# Patient Record
Sex: Male | Born: 1935 | Race: White | Hispanic: No | Smoking: Former smoker
Health system: Southern US, Community
[De-identification: ages and names within clinical notes are randomized; demographics above are authoritative.]

## PROBLEM LIST (undated history)

## (undated) DIAGNOSIS — E785 Hyperlipidemia, unspecified: Secondary | ICD-10-CM

## (undated) DIAGNOSIS — I25119 Atherosclerotic heart disease of native coronary artery with unspecified angina pectoris: Secondary | ICD-10-CM

## (undated) DIAGNOSIS — T8859XA Other complications of anesthesia, initial encounter: Secondary | ICD-10-CM

## (undated) DIAGNOSIS — I10 Essential (primary) hypertension: Secondary | ICD-10-CM

## (undated) DIAGNOSIS — T4145XA Adverse effect of unspecified anesthetic, initial encounter: Secondary | ICD-10-CM

## (undated) DIAGNOSIS — I63412 Cerebral infarction due to embolism of left middle cerebral artery: Secondary | ICD-10-CM

## (undated) DIAGNOSIS — I639 Cerebral infarction, unspecified: Secondary | ICD-10-CM

## (undated) DIAGNOSIS — I482 Chronic atrial fibrillation, unspecified: Secondary | ICD-10-CM

## (undated) DIAGNOSIS — Z8719 Personal history of other diseases of the digestive system: Secondary | ICD-10-CM

## (undated) DIAGNOSIS — R0989 Other specified symptoms and signs involving the circulatory and respiratory systems: Secondary | ICD-10-CM

## (undated) DIAGNOSIS — I2510 Atherosclerotic heart disease of native coronary artery without angina pectoris: Secondary | ICD-10-CM

## (undated) DIAGNOSIS — F329 Major depressive disorder, single episode, unspecified: Secondary | ICD-10-CM

## (undated) DIAGNOSIS — I519 Heart disease, unspecified: Secondary | ICD-10-CM

## (undated) DIAGNOSIS — F4321 Adjustment disorder with depressed mood: Secondary | ICD-10-CM

## (undated) DIAGNOSIS — I509 Heart failure, unspecified: Secondary | ICD-10-CM

## (undated) DIAGNOSIS — Z9889 Other specified postprocedural states: Secondary | ICD-10-CM

## (undated) DIAGNOSIS — T7840XA Allergy, unspecified, initial encounter: Secondary | ICD-10-CM

## (undated) DIAGNOSIS — I4891 Unspecified atrial fibrillation: Secondary | ICD-10-CM

## (undated) DIAGNOSIS — E7800 Pure hypercholesterolemia, unspecified: Secondary | ICD-10-CM

## (undated) DIAGNOSIS — F32A Depression, unspecified: Secondary | ICD-10-CM

## (undated) DIAGNOSIS — Z7901 Long term (current) use of anticoagulants: Secondary | ICD-10-CM

## (undated) DIAGNOSIS — Z8711 Personal history of peptic ulcer disease: Secondary | ICD-10-CM

## (undated) DIAGNOSIS — I219 Acute myocardial infarction, unspecified: Secondary | ICD-10-CM

## (undated) DIAGNOSIS — Z9289 Personal history of other medical treatment: Secondary | ICD-10-CM

## (undated) DIAGNOSIS — I6523 Occlusion and stenosis of bilateral carotid arteries: Secondary | ICD-10-CM

## (undated) DIAGNOSIS — I6529 Occlusion and stenosis of unspecified carotid artery: Secondary | ICD-10-CM

## (undated) HISTORY — DX: Allergy, unspecified, initial encounter: T78.40XA

## (undated) HISTORY — DX: Adjustment disorder with depressed mood: F43.21

## (undated) HISTORY — DX: Essential (primary) hypertension: I10

## (undated) HISTORY — PX: LUMBAR DISC SURGERY: SHX700

## (undated) HISTORY — PX: CATARACT EXTRACTION W/ INTRAOCULAR LENS  IMPLANT, BILATERAL: SHX1307

## (undated) HISTORY — DX: Occlusion and stenosis of bilateral carotid arteries: I65.23

## (undated) HISTORY — DX: Cerebral infarction due to embolism of left middle cerebral artery: I63.412

## (undated) HISTORY — DX: Occlusion and stenosis of unspecified carotid artery: I65.29

## (undated) HISTORY — DX: Hyperlipidemia, unspecified: E78.5

## (undated) HISTORY — PX: STOMACH SURGERY: SHX791

## (undated) HISTORY — PX: TONSILLECTOMY AND ADENOIDECTOMY: SUR1326

## (undated) HISTORY — PX: COLON SURGERY: SHX602

## (undated) HISTORY — DX: Long term (current) use of anticoagulants: Z79.01

## (undated) HISTORY — DX: Cerebral infarction, unspecified: I63.9

## (undated) HISTORY — PX: CARDIAC CATHETERIZATION: SHX172

## (undated) HISTORY — DX: Other specified symptoms and signs involving the circulatory and respiratory systems: R09.89

## (undated) HISTORY — DX: Atherosclerotic heart disease of native coronary artery without angina pectoris: I25.10

## (undated) HISTORY — PX: TRANSURETHRAL RESECTION OF PROSTATE: SHX73

## (undated) HISTORY — PX: BACK SURGERY: SHX140

## (undated) HISTORY — PX: CARPAL TUNNEL RELEASE: SHX101

## (undated) HISTORY — DX: Other specified postprocedural states: Z98.890

## (undated) HISTORY — DX: Heart disease, unspecified: I51.9

## (undated) HISTORY — DX: Atherosclerotic heart disease of native coronary artery with unspecified angina pectoris: I25.119

## (undated) HISTORY — PX: MULTIPLE TOOTH EXTRACTIONS: SHX2053

## (undated) HISTORY — DX: Acute myocardial infarction, unspecified: I21.9

## (undated) HISTORY — DX: Pure hypercholesterolemia, unspecified: E78.00

## (undated) HISTORY — DX: Unspecified atrial fibrillation: I48.91

---

## 1976-02-11 HISTORY — PX: CORONARY ARTERY BYPASS GRAFT: SHX141

## 2012-06-10 ENCOUNTER — Encounter: Payer: Self-pay | Admitting: Cardiovascular Disease

## 2012-06-10 ENCOUNTER — Encounter (INDEPENDENT_AMBULATORY_CARE_PROVIDER_SITE_OTHER): Payer: Medicare Other

## 2012-06-10 ENCOUNTER — Encounter: Payer: Self-pay | Admitting: *Deleted

## 2012-06-10 ENCOUNTER — Ambulatory Visit (INDEPENDENT_AMBULATORY_CARE_PROVIDER_SITE_OTHER): Payer: Medicare Other | Admitting: Cardiovascular Disease

## 2012-06-10 VITALS — BP 132/82 | HR 73 | Wt 160.0 lb

## 2012-06-10 DIAGNOSIS — R0989 Other specified symptoms and signs involving the circulatory and respiratory systems: Secondary | ICD-10-CM

## 2012-06-10 DIAGNOSIS — I6529 Occlusion and stenosis of unspecified carotid artery: Secondary | ICD-10-CM

## 2012-06-10 DIAGNOSIS — I4891 Unspecified atrial fibrillation: Secondary | ICD-10-CM

## 2012-06-10 DIAGNOSIS — E785 Hyperlipidemia, unspecified: Secondary | ICD-10-CM | POA: Insufficient documentation

## 2012-06-10 DIAGNOSIS — I519 Heart disease, unspecified: Secondary | ICD-10-CM

## 2012-06-10 HISTORY — DX: Other specified symptoms and signs involving the circulatory and respiratory systems: R09.89

## 2012-06-10 HISTORY — DX: Unspecified atrial fibrillation: I48.91

## 2012-06-10 LAB — CBC WITH DIFFERENTIAL/PLATELET
Basophils Relative: 0.6 % (ref 0.0–3.0)
Eosinophils Relative: 4.8 % (ref 0.0–5.0)
HCT: 43.6 % (ref 39.0–52.0)
Lymphs Abs: 2.2 10*3/uL (ref 0.7–4.0)
MCV: 84.6 fl (ref 78.0–100.0)
Monocytes Absolute: 1 10*3/uL (ref 0.1–1.0)
Platelets: 176 10*3/uL (ref 150.0–400.0)
WBC: 11.5 10*3/uL — ABNORMAL HIGH (ref 4.5–10.5)

## 2012-06-10 LAB — BASIC METABOLIC PANEL
BUN: 17 mg/dL (ref 6–23)
Calcium: 9.3 mg/dL (ref 8.4–10.5)
GFR: 59.56 mL/min — ABNORMAL LOW (ref >60.00)
Glucose, Bld: 93 mg/dL (ref 70–99)
Sodium: 139 mEq/L (ref 135–145)

## 2012-06-10 LAB — APTT: aPTT: 31.3 s — ABNORMAL HIGH (ref 21.7–28.8)

## 2012-06-10 MED ORDER — RIVAROXABAN 20 MG PO TABS: 20.0000 mg | ORAL_TABLET | Freq: Every day | ORAL | Status: DC

## 2012-06-10 NOTE — Assessment & Plan Note (Signed)
Cholesterol is at goal.  Continue current dose of statin and diet Rx.  No myalgias or side effects.  F/U  LFT's in 6 months. No results found for this basename: [facility]   Labs with Dr Lin Landsman

## 2012-06-10 NOTE — Patient Instructions (Addendum)
Your physician recommends that you schedule a follow-up appointment in: 51 - Dave Harris has recommended you make the following change in your medication:  START   XARELTO 20 MG  [place]  Your physician has requested that you have an echocardiogram. Echocardiography is a painless test that uses sound waves to create images of your heart. It provides your doctor with information about the size and shape of your heart and how well your heart's chambers and valves are working. This procedure takes approximately one hour. There are no restrictions for this procedure.   Your physician recommends that you return for lab work in: TODAY  BMET CBC PT PTT  DX  427.31 V58.69  [place]

## 2012-06-10 NOTE — Progress Notes (Signed)
Patient ID: Dave Harris, male   DOB: [birth year], 79 y.o.   MRN: [id number] 79 yo referred by Dr Lin Landsman for cards f/u Distant history of CABG no records available.  Has not had any dyspnea or chest pain.  Thinks CABG was done at [facility] in 88 In office today he was in atrial flutter. Unaware of this Notes occasional palpitations. No mention in records of arrhythmia.  History of PVD with bruits Duplex reviewed form [id number] And [id number] LICA and [id number] RICA  No TIA symptoms No bleeding issues.  No contraindications to anticoagulation.  Normal renal function in past   ROS: Denies fever, malais, weight loss, blurry vision, decreased visual acuity, cough, sputum, SOB, hemoptysis, pleuritic pain, palpitaitons, heartburn, abdominal pain, melena, lower extremity edema, claudication, or rash.  All other systems reviewed and negative   General: Affect appropriate Healthy:  appears stated age 7: normal Neck supple with no adenopathy JVP normal bilateral bruits no thyromegaly Lungs clear with no wheezing and good diaphragmatic motion Heart:  S1/S2 no murmur,rub, gallop or click PMI normal Abdomen: benighn, BS positve, no tenderness, no AAA no bruit.  No HSM or HJR Distal pulses intact with no bruits No edema Neuro non-focal Skin warm and dry No muscular weakness  Medications Current Outpatient Prescriptions  Medication Sig Dispense Refill  . aspirin 325 MG tablet Take 325 mg by mouth daily.      Marland Kitchen atorvastatin (LIPITOR) 40 MG tablet Take 40 mg by mouth daily.      . Rivaroxaban (XARELTO) 20 MG TABS Take 1 tablet (20 mg total) by mouth daily.  30 tablet  11   No current facility-administered medications for this visit.    Allergies Plavix  Family History: Family History  Problem Relation Age of Onset  . CAD      Social History: History   Social History  . Marital Status: Married    Spouse Name: N/A    Number of Children: N/A  . Years of Education: N/A   Occupational  History  . Not on file.   Social History Main Topics  . Smoking status: Former Research scientist (life sciences)  . Smokeless tobacco: Not on file  . Alcohol Use: No  . Drug Use: No  . Sexually Active: Not on file   Other Topics Concern  . Not on file   Social History Narrative  . No narrative on file    Electrocardiogram: Afib / Flutter rate 73  Nonspecific ST/T wave changes  Assessment and Plan

## 2012-06-10 NOTE — Assessment & Plan Note (Addendum)
Rate even with no AV nodal drug seems ok.  Start xarelto.  Check labs Echo to assess atrial sizes and EF as well valve disease. Suspect rate control and anticoagulation will be best F/U anticoagulation clinic Risks and benefits discussed He has relative who had issues with coumadin and he does not want to come for frequent blood work.

## 2012-06-10 NOTE — Assessment & Plan Note (Signed)
Will get CABG records No indication for stress test.  Echo for EF

## 2012-06-10 NOTE — Assessment & Plan Note (Signed)
Duplex today with moderate left sided disease Decrease ASA to 81 mg since starting anticoagulation Duplex in 6 months

## 2012-06-16 ENCOUNTER — Ambulatory Visit (HOSPITAL_COMMUNITY): Payer: Medicare Other | Attending: Cardiovascular Disease | Admitting: Radiology

## 2012-06-16 VITALS — Ht 69.0 in

## 2012-06-16 DIAGNOSIS — I4891 Unspecified atrial fibrillation: Secondary | ICD-10-CM | POA: Insufficient documentation

## 2012-06-16 DIAGNOSIS — I4892 Unspecified atrial flutter: Secondary | ICD-10-CM

## 2012-06-16 DIAGNOSIS — E785 Hyperlipidemia, unspecified: Secondary | ICD-10-CM | POA: Insufficient documentation

## 2012-06-16 NOTE — Progress Notes (Signed)
Echocardiogram performed.  

## 2012-06-22 ENCOUNTER — Encounter: Payer: Self-pay | Admitting: Cardiovascular Disease

## 2012-06-22 ENCOUNTER — Telehealth: Payer: Self-pay | Admitting: *Deleted

## 2012-06-22 NOTE — Telephone Encounter (Signed)
Does not appear that he can take blood thinners.  F/U with me next available

## 2012-06-22 NOTE — Telephone Encounter (Signed)
PT AWARE TO CONTINUE TO HOLD   XARELTO  AND  KEEP APPT  SCHEDULED FOR 07-30-12 AT  3:00 PM  WITH DR Johnsie Cancel  INSTRUCTED IF CONT TO HAVE BLEEDING OR FEELS WORSE TO CALL PT VERBALIZED UNDERSTANDING .Adonis Housekeeper

## 2012-06-22 NOTE — Telephone Encounter (Signed)
JENNIFER FROM DR REDDINGS' OFFICE CALLED PT WALKED  INTO OFFICE COMPLAINING WITH BLEEDING FROM GUMS , SWOLLEN GUMS AND SPITTING UP BLOOD   WAS RECENTLY STARTED ON  XARELTO  FOR AFLUTTER   INSTRUCTIONS GIVEN TO  JENNIFER TO HAVE PT STOP  XARELTO  AND TO GET CBC  .WILL FORWARD TO DR Johnsie Cancel  FOR REVIEW .Adonis Housekeeper

## 2012-06-30 NOTE — Telephone Encounter (Signed)
DR [facility] CALLED AND STATED PT DID NOT WANT TO TAKE XARELTO  PT WAS  TOLD TO HOLD  DUE TO BLEEDING  -PT HAS APPT  IN June WITH DR Johnsie Cancel  TO DISCUSS OTHER TREATMENT PLANS   PER DR [place] WOULD LIKE A  COPY OF CAROTID FAXED FOR HIS INFO COPY SENT  AS REQUESTED  [place] NUMBER  [phone]./CY

## 2012-07-06 NOTE — Telephone Encounter (Signed)
Spoke to patient's emergency contact Madaline Guthrie) to verify that Dr. Johnsie Cancel indicated on 06/30/12 that patient should keep the June 20th appt at 3pm, so as to discuss further treatment options. Ms. Lily Peer agreed to plan and stated she would share information with the patient.

## 2012-07-06 NOTE — Telephone Encounter (Signed)
New problem    Please advise if appt on  6/20 she they keep or cancel due to medication was stop.

## 2012-07-12 ENCOUNTER — Telehealth: Payer: Self-pay | Admitting: Cardiovascular Disease

## 2012-07-12 NOTE — Telephone Encounter (Signed)
New Problem  Pt's daughter called to let Dr Johnsie Cancel know that pt is having a procedure done this Friday. He is supposed to be put to sleep and run a light down his throat to find out why he is having difficulty swallowing. Pt did not alert that physician that he sees a Cardiologist at all. Daughter is concerned if the procedure should be done. She said it is schedule at [facility]. [phone].  She just felt like Dr Johnsie Cancel should know.

## 2012-07-13 NOTE — Telephone Encounter (Signed)
Dave Harris PT MAY PROCEED WITH PROCEDURE  ON 07-16-12 ALSO LEFT VOICE MAIL WITH NINA  THAT PT IS OKAY TO  PROCEED  ON 07-16-12 WITHOUT ANYTHING SPECIAL ./CY

## 2012-07-13 NOTE — Telephone Encounter (Signed)
Follow-up:    Called in wanting to know if the patient needs any special treatment prior to his dilation on 07/16/12.  Please call back.

## 2012-07-13 NOTE — Telephone Encounter (Signed)
Echo looked ok should be ok for ? Endoscopy without anything special

## 2012-07-30 ENCOUNTER — Encounter: Payer: Self-pay | Admitting: Cardiovascular Disease

## 2012-07-30 ENCOUNTER — Ambulatory Visit (INDEPENDENT_AMBULATORY_CARE_PROVIDER_SITE_OTHER): Payer: Medicare Other | Admitting: Cardiovascular Disease

## 2012-07-30 VITALS — BP 133/68 | HR 66 | Ht 69.0 in | Wt 164.0 lb

## 2012-07-30 DIAGNOSIS — I519 Heart disease, unspecified: Secondary | ICD-10-CM

## 2012-07-30 DIAGNOSIS — E785 Hyperlipidemia, unspecified: Secondary | ICD-10-CM

## 2012-07-30 DIAGNOSIS — I4891 Unspecified atrial fibrillation: Secondary | ICD-10-CM

## 2012-07-30 DIAGNOSIS — R0989 Other specified symptoms and signs involving the circulatory and respiratory systems: Secondary | ICD-10-CM

## 2012-07-30 NOTE — Assessment & Plan Note (Signed)
Left ICA 60-79% stenosis.  No TIA symptoms.  Continue antiplatelet Rx and F/U carotid duplex in 6 months   

## 2012-07-30 NOTE — Progress Notes (Signed)
Patient ID: Dave Harris, male   DOB: 01/23/1930, 79 y.o.   MRN: [id number] 79 yo referred by Dr Lin Landsman for cards f/u Distant history of CABG no records available. Has not had any dyspnea or chest pain. Thinks CABG was done at [facility] in 88  In office May he was in atrial flutter. Unaware of this Notes occasional palpitations. No mention in records of arrhythmia. History of PVD with bruits Duplex reviewed form [id number]  And [id number] LICA and [id number] RICA  He had hemoptysis or coffe grounds after 5 xarelto doses and insurance wouldn't cover novel agents.  Eventually had esophagus stretched by GI For dysphagia but denies lesions. Quit smoking a long time ago Indicates normal Xrays in recent past [place].  He really isn't clear if bleeding was coming from bronchi or throat  Echo 06/16/12 normal EF Study Conclusions  - Left ventricle: The cavity size was normal. Systolic function was normal. The estimated ejection fraction was in the range of 55% to 60%. Wall motion was normal; there were no regional wall motion abnormalities. - Mitral valve: Mild regurgitation. - Left atrium: The atrium was moderately dilated. - Right atrium: The atrium was mildly dilated. - Atrial septum: No defect or patent foramen ovale was identified. - Pericardium, extracardiac: A trivial pericardial effusion was identified posterior to the heart.  Duplex carotids [id number] LICA  ROS: Denies fever, malais, weight loss, blurry vision, decreased visual acuity, cough, sputum, SOB, hemoptysis, pleuritic pain, palpitaitons, heartburn, abdominal pain, melena, lower extremity edema, claudication, or rash.  All other systems reviewed and negative  General: Affect appropriate Healthy:  appears stated age 39: normal Neck supple with no adenopathy JVP normal bilateral  bruits no thyromegaly Lungs clear with no wheezing and good diaphragmatic motion Heart:  S1/S2 no murmur, no rub, gallop or click PMI normal Abdomen:  benighn, BS positve, no tenderness, no AAA no bruit.  No HSM or HJR Distal pulses intact with no bruits No edema Neuro non-focal Skin warm and dry No muscular weakness   Current Outpatient Prescriptions  Medication Sig Dispense Refill  . aspirin 325 MG tablet Take 325 mg by mouth daily.      Marland Kitchen atorvastatin (LIPITOR) 40 MG tablet Take 40 mg by mouth daily.       No current facility-administered medications for this visit.    Allergies  Plavix  Electrocardiogram: NSR rate 63 ICRBBB nonspecific ST/T wave changes  Assessment and Plan

## 2012-07-30 NOTE — Assessment & Plan Note (Signed)
Distant CABG with no angina Continue ASA

## 2012-07-30 NOTE — Assessment & Plan Note (Signed)
Cholesterol is at goal.  Continue current dose of statin and diet Rx.  No myalgias or side effects.  F/U  LFT's in 6 months. No results found for this basename: [place] with [place]

## 2012-07-30 NOTE — Assessment & Plan Note (Signed)
Intermitant and asymptomatic Severe bleeding issues with xarelto  Will not reinstitute anticoagulation.

## 2012-07-30 NOTE — Patient Instructions (Signed)
Your physician wants you to follow-up in:  6 MONTHS WITH DR NISHAN  You will receive a reminder letter in the mail two months in advance. If you don't receive a letter, please call our office to schedule the follow-up appointment. Your physician recommends that you continue on your current medications as directed. Please refer to the Current Medication list given to you today. 

## 2012-09-22 ENCOUNTER — Encounter: Payer: Self-pay | Admitting: Cardiovascular Disease

## 2014-03-31 ENCOUNTER — Telehealth: Payer: Self-pay

## 2014-03-31 ENCOUNTER — Telehealth: Payer: Self-pay | Admitting: Surgery

## 2014-03-31 NOTE — Telephone Encounter (Signed)
Spoke with pt to schedule for Monday 04/03/14, dpm

## 2014-03-31 NOTE — Telephone Encounter (Signed)
Rec'd referral from Dr. Lin Landsman (he is faxing information on this pt.)  He is requesting to work this pt. in next week.(wk. Of 2/22)  Reported that carotid duplex was done at [facility]; reported that pt. has a "tight" lesion, up to 99%.  Asymptomatic.  On a statin and ASA.  Per Dr. Lin Landsman, he was advised to go to the ER if becomes symptomatic over the weekend.  Dr. Lin Landsman is requesting Dr. Donnetta Hutching, but if not available would like him to be worked in with one of his partners.  Pt. Was notified; Appt. given for 2/22 @ 3:00 PM with Dr. Trula Slade.

## 2014-03-31 NOTE — Telephone Encounter (Signed)
-----   Message from Sherrye Payor, RN sent at 03/31/2014  4:51 PM EST ----- Regarding: new pt. consult  rec'd referral from Dr. Reece Levy (he is faxing information on this pt.)  He is requesting to work this pt. In next week. Reported that carotid duplex was done at [facility]; reported that pt. has a "tight" lesion, up to 99%.  Asymptomatic.  On a statin and ASA.  Per Dr. Reece Levy, he was advised to go to the ER if becomes symptomatic over the weekend.  Dr. Reece Levy is requesting Dr. Donnetta Hutching to do the consult, but said if not available next week, he can see one of his partners.

## 2014-04-03 ENCOUNTER — Other Ambulatory Visit: Payer: Self-pay | Admitting: *Deleted

## 2014-04-03 ENCOUNTER — Ambulatory Visit (INDEPENDENT_AMBULATORY_CARE_PROVIDER_SITE_OTHER): Payer: PPO | Admitting: Surgery

## 2014-04-03 ENCOUNTER — Encounter: Payer: Self-pay | Admitting: Surgery

## 2014-04-03 ENCOUNTER — Ambulatory Visit (HOSPITAL_COMMUNITY)
Admission: RE | Admit: 2014-04-03 | Discharge: 2014-04-03 | Disposition: A | Discharge status: Home / Self Care | Payer: PPO | Source: Ambulatory Visit | Attending: Surgery | Admitting: Surgery

## 2014-04-03 VITALS — BP 140/76 | HR 67 | Ht 69.0 in | Wt 160.3 lb

## 2014-04-03 DIAGNOSIS — I6522 Occlusion and stenosis of left carotid artery: Secondary | ICD-10-CM

## 2014-04-03 NOTE — Progress Notes (Signed)
Patient name: Dave Harris MRN: [id number] DOB: [birth year] Sex: male   Referred by: Dr. Lin Landsman  Reason for referral:  Chief Complaint  Patient presents with  . Carotid    new pt     HISTORY OF PRESENT ILLNESS: This is a very pleasant 81 year old gentleman who is referred today for asymptomatic left carotid stenosis.  The patient recently underwent a duplex ultrasound which revealed greater than 70% left carotid stenosis.  The patient is asymptomatic.  Specifically, he denies numbness or weakness in either extremity.  He denies slurred speech.  He denies amaurosis fugax.  The patient does report occasional left-sided numbness which resolves spontaneously.  He does report having had a stroke in 1974 which presented with left-sided numbness.  He said he got 2 shots in his abdomen and was sent home the following day and has not had any residual deficits.  The patient suffers from coronary artery disease.  He is status post CABG almost 30 years ago.  He has atrial fibrillation but refuses to take anticoagulation.  He is just on aspirin.  His hypercholesterolemia is managed with a statin.  He recently ran out of his blood pressure medicine but states that his blood pressure has been under good control.  He has a history of smoking but quit 30 years ago.  Past Medical History  Diagnosis Date  . Hyperlipidemia   . Heart disease   . Atrial fibrillation   . CAD (coronary artery disease)   . Stroke     Past Surgical History  Procedure Laterality Date  . Back surgery    . Carpal tunnel release    . Hernia repair    . Prostatectomy    . Coronary artery bypass graft      History   Social History  . Marital Status: Married    Spouse Name: N/A  . Number of Children: N/A  . Years of Education: N/A   Occupational History  . Not on file.   Social History Main Topics  . Smoking status: Former Research scientist (life sciences)  . Smokeless tobacco: Not on file  . Alcohol Use: No  . Drug Use: No  .  Sexual Activity: Not on file   Other Topics Concern  . Not on file   Social History Narrative    Family History  Problem Relation Age of Onset  . CAD    . Heart disease Brother     before age 66  . Heart attack Brother     Allergies as of 04/03/2014 - Review Complete 04/03/2014  Allergen Reaction Noted  . Plavix [clopidogrel bisulfate]  06/10/2012    Current Outpatient Prescriptions on File Prior to Visit  Medication Sig Dispense Refill  . aspirin 325 MG tablet Take 325 mg by mouth daily.    Marland Kitchen atorvastatin (LIPITOR) 40 MG tablet Take 40 mg by mouth daily.     No current facility-administered medications on file prior to visit.     REVIEW OF SYSTEMS: Cardiovascular: No chest pain, chest pressure, palpitations, orthopnea, or dyspnea on exertion. No claudication or rest pain,  No history of DVT or phlebitis. Pulmonary: No productive cough, asthma or wheezing. Neurologic: Occasional left-sided temporary numbness No dizziness. Hematologic: No bleeding problems or clotting disorders. Musculoskeletal: No joint pain or joint swelling. Gastrointestinal: No blood in stool or hematemesis Genitourinary: No dysuria or hematuria. Psychiatric:: No history of major depression. Integumentary: No rashes or ulcers. Constitutional: No fever or chills.  PHYSICAL EXAMINATION: General: The patient  appears their stated age.  Vital signs are BP 140/76 mmHg  Pulse 67  Ht 5\' 9"  (1.753 m)  Wt 160 lb 4.8 oz (72.712 kg)  BMI 23.66 kg/m2  SpO2 99% HEENT:  No gross abnormalities Pulmonary: Respirations are non-labored Abdomen: Soft and non-tender  Musculoskeletal: There are no major deformities.   Neurologic: No focal weakness or paresthesias are detected, Skin: There are no ulcer or rashes noted. Psychiatric: The patient has normal affect. Cardiovascular: There is a regular rate and rhythm without significant murmur appreciated.  Palpable pedal pulses.  Questionable faint left carotid  bruit  Diagnostic Studies: I have reviewed his outside carotid duplex which shows greater than 70% left carotid stenosis and minimal right carotid stenosis.  A limited study of the left side was performed in our office today which shows 60-79 percent left carotid stenosis.  End diastolic velocity was 79 cm/s.    Assessment:  Asymptomatic left carotid stenosis Plan: I discussed the ultrasound findings today with the patient.  By combining the 2 studies I suspect the patient's stenosis is in the 70-80 percent range.  I told him that for asymptomatic stenosis, I would recommend carotid endarterectomy once his blockage becomes greater than 80%.  We also discussed the signs and symptoms of a TIA/stroke and to contact me immediately as well as EMS.  I have recommended follow-up with me in 6 months for a repeat ultrasound.     Eldridge Abrahams, M.D. [facility] Office: [phone] Pager:  [phone]

## 2014-07-21 DIAGNOSIS — I10 Essential (primary) hypertension: Secondary | ICD-10-CM

## 2014-07-21 DIAGNOSIS — I110 Hypertensive heart disease with heart failure: Secondary | ICD-10-CM | POA: Insufficient documentation

## 2014-07-21 DIAGNOSIS — E7800 Pure hypercholesterolemia, unspecified: Secondary | ICD-10-CM

## 2014-07-21 DIAGNOSIS — I25119 Atherosclerotic heart disease of native coronary artery with unspecified angina pectoris: Secondary | ICD-10-CM

## 2014-07-21 HISTORY — DX: Pure hypercholesterolemia, unspecified: E78.00

## 2014-07-21 HISTORY — DX: Essential (primary) hypertension: I10

## 2014-07-21 HISTORY — DX: Atherosclerotic heart disease of native coronary artery with unspecified angina pectoris: I25.119

## 2014-10-09 ENCOUNTER — Encounter (HOSPITAL_COMMUNITY): Payer: PPO

## 2014-10-09 ENCOUNTER — Ambulatory Visit: Payer: PPO | Admitting: Surgery

## 2014-10-12 NOTE — Patient Outreach (Signed)
[facility]) Care Management  10/12/2014  Dave Harris September 03, 1929 [id number]   Referral from HTA tier 4 list, assigned to Tomasa Rand, [facility] for patient outreach.  Ajit Errico L. Richel Millspaugh, [place] Care Management Assistant

## 2014-10-20 ENCOUNTER — Other Ambulatory Visit: Payer: Self-pay

## 2014-10-20 NOTE — Patient Outreach (Signed)
[employer] referral: Reviewed EPIC noted. 9:52 amPlaced call to home telephone number.  Number is not in service. 9:53amPlaced call to mobile number. Unidentified machine. No message left. 9:64am Placed call to MD office and spoke with Grayland Jack who report patient was seen this week by Dr. Lin Landsman. For spitting up blood and headaches.  Reviewed contact information and Di Kindle reports the same mobile number. Reports different address of [address], [place] [address].  Plan: Will continue outreach efforts at a later date.  Tomasa Rand, RN, BSN, CEN [facility] [employer] [phone]

## 2014-10-25 ENCOUNTER — Other Ambulatory Visit: Payer: Self-pay

## 2014-10-25 NOTE — Patient Outreach (Signed)
2nd outreach attempt: Placed call to patient who answered the phone.Explained purpose of call. Patient reports that he recently had a stoke that affected his left side. Reports that he is going to start rehab on 10/27/2014.  Reports that he is interested in [facility] program.  Reviewed address. New address updated.  Initial home visit offered and patient has accepted. Will see patient on 11/09/2014. Will call patient on 11/08/2014 to confirm appointment.   Plan: Will see patient for home assessment.  Tomasa Rand, RN, BSN, CEN [facility] [employer] [phone]

## 2014-11-09 ENCOUNTER — Other Ambulatory Visit: Payer: Self-pay

## 2014-11-09 ENCOUNTER — Ambulatory Visit: Payer: Self-pay

## 2014-11-09 NOTE — Patient Outreach (Signed)
Care Coordination: Patient requested a call to remind of appointment. Place call to patient who states that his daughter is in the hospital with heart problems. He reports that he will not be home for home visit today.   Appointment rescheduled for October 10th.  Tomasa Rand, RN, BSN, CEN [facility] [employer] [phone]

## 2014-11-20 ENCOUNTER — Other Ambulatory Visit: Payer: Self-pay

## 2014-11-20 NOTE — Patient Outreach (Signed)
Dave Harris) Care Management   11/20/2014  CAL LISOWSKI 1929-08-03 AM:[id number] 10:00 AM   Arrived for home visit with Dave Harris. Patient consented for student to be present.  Patient lives in a camper on his daughters property. However, daughter does not live on the property.  Dave Harris is an 81 y.o. male  Subjective: Patient reports that he has had 3 strokes in 3-4 months. Reports first stroke in June 2016, second July/ August 2016, and third stroke 10/2014.  Reports he is doing well. Denies going to rehab at this time. Reports that he does his home exercises at least twice a day.  Patient reports that his blood pressure has been much better lately when his uses his home monitor. Reports that he checks his blood pressure standing. Reports that he checks his blood pressure but does not record in a log. Reports that he saw his primary MD last week for follow up and was given a target blood pressure.   Patient reports that he just got his camper about 4 weeks ago. Reports that it is small but he likes it. Reports that he was able to pay off his deceased wife's bills that he was struggling to pay. Patient reports that he has applied for medicaid and waiting for approval. Patient reports while in tears that he misses his wife so much. Patient admits to being active with his church and continues to drive. Patient reports good family support with 3 daughters.  Patient reports that he eats sandwiches. Reports that his daughters bring him meals as well.  Patient reports that he eat bolonga and pickleloaf. .   Objective:  Awake and alert. Ambulating well at home.  3 steps to entrance of camper. Camper was clean and neat.  Filed Vitals:   11/20/14 1018 11/20/14 1022  BP: 142/80 138/80  Pulse:  60  Resp:  18  Height: 1.753 m (5\' 9" )   Weight: 155 lb (70.308 kg)   SpO2:  95%   Review of Systems  Constitutional: Negative.   Respiratory: Negative.    Cardiovascular: Negative.   Gastrointestinal: Negative.   Genitourinary: Negative.   Musculoskeletal: Negative.   Skin: Negative.   Neurological: Negative.   Endo/Heme/Allergies: Negative.   Psychiatric/Behavioral: Negative.     Physical Exam  Constitutional: He is oriented to person, place, and time. He appears well-developed and well-nourished.  Cardiovascular: Normal rate and normal heart sounds.   Respiratory: Effort normal and breath sounds normal.  Lungs clear  GI: Soft. Bowel sounds are normal.  Musculoskeletal: Normal range of motion. He exhibits no edema.  Neurological: He is alert and oriented to person, place, and time.  Skin: Skin is warm and dry.  Psychiatric: His behavior is normal. Judgment and thought content normal.  One episodes of crying during home visit when patient discussed missing his wife.    Current Medications:   Current Outpatient Prescriptions  Medication Sig Dispense Refill  . apixaban (ELIQUIS) 2.5 MG TABS tablet Take 2.5 mg by mouth 2 (two) times daily.    Marland Kitchen aspirin 325 MG tablet Take 325 mg by mouth daily.    Marland Kitchen atorvastatin (LIPITOR) 40 MG tablet Take 40 mg by mouth daily.    . carvedilol (COREG) 3.125 MG tablet Take 3.125 mg by mouth 2 (two) times daily with a meal.    . docusate sodium (COLACE) 100 MG capsule Take 100 mg by mouth 1 day or 1 dose. Takes as needed. Usually once a  week.    . fexofenadine (ALLEGRA) 180 MG tablet Take 180 mg by mouth daily.    Marland Kitchen guaifenesin (HUMIBID E) 400 MG TABS tablet Take 400 mg by mouth every 4 (four) hours. Take as needed only.    . mirtazapine (REMERON) 15 MG tablet Take 15 mg by mouth at bedtime.    . Multiple Vitamins-Minerals (PRESERVISION AREDS PO) Take 1 tablet by mouth every morning.     No current facility-administered medications for this visit.    Functional Status:   In your present state of health, do you have any difficulty performing the following activities: 11/20/2014  Hearing? Y  Vision?  N  Difficulty concentrating or making decisions? Y  Walking or climbing stairs? N  Dressing or bathing? N  Doing errands, shopping? N  Preparing Food and eating ? N  Using the Toilet? N  In the past six months, have you accidently leaked urine? N  Do you have problems with loss of bowel control? N  Managing your Medications? N  Managing your Finances? N  Housekeeping or managing your Housekeeping? N    Fall/Depression Screening:    PHQ 2/9 Scores 11/20/2014  PHQ - 2 Score 4  PHQ- 9 Score 7   Fall Risk  11/20/2014  Falls in the past year? Yes  Number falls in past yr: 2 or more  Injury with Fall? Yes  Risk Factor Category  High Fall Risk  Risk for fall due to : History of fall(s)  Follow up Falls evaluation completed   Assessment:   (1) Reviewed [facility] program. Provided [facility] new patient packet. Reviewed consent form. Provided contact information. Provided patient with [facility] calendar. (2) Recent stroke. Patient monitoring his own blood pressure without difficulty. However not keeping a log.   (3) Reviewed importance of low salt diet. (4) positive depression screening.  (5) fall risk screening positive   Plan:  (1) Consent obtained and scanned into chart. (2) Patient has agreed to keep a BP log and call MD with abnormal readings. (3) Reviewed food to avoid which are high in sodium.  Will print EMMI and mail to patient. (4) Will notify MD of depression screening. (5) Will notify MD of fall risk screening.  Collaborative care planning and goal setting during home visit. Priority goal is avoid another stroke and admission. Discussed with patient the importance of good blood pressure control. [facility] CM Care Plan Problem One        Most Recent Value   Care Plan Problem One  Recent admission in September for stoke   Role Documenting the Problem One  Care Management [place] for Problem One  Active   THN Long Term Goal (31-90 days)  Patient will be able to report no new  strokes in the next 31 days   THN Long Term Goal Start Date  11/20/14   Interventions for Problem One Long Term Goal  Reviewed importance of calling for help when symptoms start. Reviewed signs and symptoms of stroke.  Reviewed importance of taking all meds as prescribed.   THN CM Short Term Goal #1 (0-30 days)  Patient will be able to record daily blood pressure readings in the next 30 days.   THN CM Short Term Goal #1 Start Date  11/20/14   Interventions for Short Term Goal #1  Reviewed importance of blood pressure control with patient. Provided EMMI education. Reviewed reasons for call MD for elevated blood pressure readings outside target range determined by primary  MD. Provided [facility] calendar for patient to record blood pressure readings.   THN CM Short Term Goal #2 (0-30 days)  Patient will report reading his EMMI education on strokes and high blood pressure in he next 30 days.   THN CM Short Term Goal #2 Start Date  11/20/14   Interventions for Short Term Goal #2  Started log for blood pressures today. Showed patient how to record readings. Encouraged  patiient to eat low salt foods . Provided some examples of eating [place] or chicken on his sandwiches.  Will mail EMMI education on low salt diets.     EMMI education handouts reviewed during home visit include: High blood pressure and taking your blood pressure, Atrial fibrillation and the risk of stroke, Recovering from a stroke, Signs of a stroke. Next home visit planned for November  7th.  Initial home visit and barrier letter sent to MD.   Tomasa Rand, RN, BSN, [place] Coordinator [phone]

## 2014-12-18 ENCOUNTER — Other Ambulatory Visit: Payer: Self-pay

## 2014-12-19 ENCOUNTER — Other Ambulatory Visit: Payer: Self-pay

## 2014-12-19 NOTE — Patient Outreach (Signed)
[facility]) Care Management   12/19/2014  DANILE TRIER 08-01-1929 [id number]  Dave Harris is an 81 y.o. male Arrived to home visit at 63 am. Daughter Danton Clap present.  Subjective: Patient reports that " I have been sick for 4 days"   " I did not want to go to the hospital or MD until I saw you"  Patient reports that he developed some left lower groin pain, nausea, vomiting, chills, and dysuria 4 days ago. States he does not feel good and does not feel like eating.  Denies any changes to medication since last home visit. Denies any ED visits or admissions since last home visit.  Patient reports that he has been keeping his blood pressure log.   Objective:  Reviewed BP log.  Blood pressure range is 120-180/70-80.  Awake and alert. Appears pale and weak.   Filed Vitals:   12/18/14 1015  BP: 112/80  Pulse: 79  Resp: 18  SpO2: 97%   Review of Systems  Constitutional: Positive for chills.  Gastrointestinal: Positive for vomiting and diarrhea.  Genitourinary: Positive for dysuria and frequency.       Reports urine has odor  Musculoskeletal: Negative.   Neurological: Positive for weakness.  Endo/Heme/Allergies: Negative.   Psychiatric/Behavioral: Negative.     Physical Exam  Constitutional: He is oriented to person, place, and time. He appears well-developed.  Thin appearing  Cardiovascular: Normal rate.   Respiratory: Effort normal.  Lungs clear  GI: Soft. Bowel sounds are normal.  Hypoactive bowel sounds  Musculoskeletal: He exhibits no edema.  Neurological: He is alert and oriented to person, place, and time.  Skin: Skin is warm and dry. There is pallor.  Psychiatric: He has a normal mood and affect. His behavior is normal. Judgment and thought content normal.    Current Medications:   Current Outpatient Prescriptions  Medication Sig Dispense Refill  . apixaban (ELIQUIS) 2.5 MG TABS tablet Take 2.5 mg by mouth 2 (two) times daily.    Marland Kitchen  aspirin 325 MG tablet Take 325 mg by mouth daily.    Marland Kitchen atorvastatin (LIPITOR) 40 MG tablet Take 40 mg by mouth daily.    . carvedilol (COREG) 3.125 MG tablet Take 3.125 mg by mouth 2 (two) times daily with a meal.    . docusate sodium (COLACE) 100 MG capsule Take 100 mg by mouth 1 day or 1 dose. Takes as needed. Usually once a week.    . fexofenadine (ALLEGRA) 180 MG tablet Take 180 mg by mouth daily.    Marland Kitchen guaifenesin (HUMIBID E) 400 MG TABS tablet Take 400 mg by mouth every 4 (four) hours. Take as needed only.    . mirtazapine (REMERON) 15 MG tablet Take 15 mg by mouth at bedtime.    . Multiple Vitamins-Minerals (PRESERVISION AREDS PO) Take 1 tablet by mouth every morning.     No current facility-administered medications for this visit.    Functional Status:   In your present state of health, do you have any difficulty performing the following activities: 11/20/2014  Hearing? Y  Vision? N  Difficulty concentrating or making decisions? Y  Walking or climbing stairs? N  Dressing or bathing? N  Doing errands, shopping? N  Preparing Food and eating ? N  Using the Toilet? N  In the past six months, have you accidently leaked urine? N  Do you have problems with loss of bowel control? N  Managing your Medications? N  Managing your Finances? N  Housekeeping or managing your Housekeeping? N    Fall/Depression Screening:    PHQ 2/9 Scores 11/20/2014  PHQ - 2 Score 4  PHQ- 9 Score 7    Assessment:    (1) Keep good blood pressure log. Denies any ED or hospital admissions. (2) sick today.  Nausea, weakness, left groin pain and body aches with movement.   Plan:  (1) Encouraged patient to continue to keep bp log. (2) placed call to MD office and secured an appointment for today to see Dr. Lin Landsman for urinary symptoms today. Daughter to drive patient to appointment. Will follow up with patient on 12/19/2014.    Unable to spend time goal setting and reviewing care plan as patient is sick  today and does not feel like it.  Appointment made with patient to routine home visit on December 6th.  [facility] CM Care Plan Problem One        Most Recent Value   Care Plan Problem One  Recent admission in September for stoke   Role Documenting the Problem One  Care Management [place] for Problem One  Active   THN Long Term Goal (31-90 days)  Patient will be able to report no new strokes in the next 31 days   THN Long Term Goal Start Date  11/20/14   [facility] Long Term Goal Met Date  12/19/14 [facility] met. no recent strokes]   Interventions for Problem One Long Term Goal  Reviewed importance of calling for help when symptoms start. Reviewed signs and symptoms of stroke.  Reviewed importance of taking all meds as prescribed.   THN CM Short Term Goal #1 (0-30 days)  Patient will be able to record daily blood pressure readings in the next 30 days.   THN CM Short Term Goal #1 Start Date  11/20/14   [facility] CM Short Term Goal #1 Met Date  12/19/14 [goal met. keeping a good BP log]   Interventions for Short Term Goal #1  Reviewed importance of blood pressure control with patient. Provided EMMI education. Reviewed reasons for call MD for elevated blood pressure readings outside target range determined by primary MD. Provided [facility] calendar for patient to record blood pressure readings.   THN CM Short Term Goal #2 (0-30 days)  Patient will report reading his EMMI education on strokes and high blood pressure in he next 30 days.   THN CM Short Term Goal #2 Start Date  12/19/14 Barrie Folk restarted due to illness.]   Interventions for Short Term Goal #2  Reviewed importance for patient to read educational material provided      PHONE CALL DURING HOME VISIT TO [place] MD TODAY.Marland Kitchen   Tomasa Rand, RN, BSN, CEN [facility] [employer] [phone]

## 2014-12-19 NOTE — Patient Outreach (Signed)
Care Coordination: Placed call to patient who reports he is being treated for a urine infection. States that he has his antibiotics .  Encouraged patient to call MD for problems. Encouraged patient to eat and drink well.  Will follow up with home visit as planned for January 16 2015.  Tomasa Rand, RN, BSN, CEN [facility] [employer] [phone]  .

## 2015-01-16 ENCOUNTER — Other Ambulatory Visit: Payer: Self-pay

## 2015-01-16 ENCOUNTER — Ambulatory Visit: Payer: Self-pay

## 2015-01-16 NOTE — Patient Outreach (Signed)
Home Visit--no show,   Care coordination--admitted with stroke:  Arrived to scheduled home visit. No one home. Car in driveway.  Door locked.  Placed call to patient no answer. Placed call to daughter Janace Hoard who answered and states that patient had another stroke and is in the hospital. She is trying to get him placed at a SNF.    Plan:  Told daughter to call me if needed otherwise I will call her in 1 week for follow up.  Tomasa Rand, RN, BSN, CEN [facility] [employer] [phone]

## 2015-01-24 ENCOUNTER — Ambulatory Visit: Payer: Self-pay

## 2015-01-24 ENCOUNTER — Other Ambulatory Visit: Payer: Self-pay

## 2015-01-24 NOTE — Patient Outreach (Signed)
Care Coordination/ Case Closure.  Placed follow up call to daughter Angie. She reports that patient is currently at [facility] for rehab but it is moving to [facility] long term care.  Daughter reports that she has already gotten everything arranged.  Explained the [facility] does not follow patients in long term care and she understood. Offered a listening ear for daughter as she is working hard to care for both of her parents.  Explained to daughter if plans change to call us back and she is assured that plans will not change.  PLAN: Will close case. Will notify MD and send case closure letter to patient.  [facility] CM Care Plan Problem One        Most Recent Value   Care Plan Problem One  Recent admission in September for stoke   Role Documenting the Problem One  Care Management [place] for Problem One  Active   THN Long Term Goal (31-90 days)  Patient will be able to report no new strokes in the next 31 days   THN Long Term Goal Start Date  11/20/14   [facility] Long Term Goal Met Date  12/19/14 [facility] met. no recent strokes]   Interventions for Problem One Long Term Goal  Reviewed importance of calling for help when symptoms start. Reviewed signs and symptoms of stroke.  Reviewed importance of taking all meds as prescribed.   THN CM Short Term Goal #1 (0-30 days)  Patient will be able to record daily blood pressure readings in the next 30 days.   THN CM Short Term Goal #1 Start Date  11/20/14   [facility] CM Short Term Goal #1 Met Date  12/19/14 [goal met. keeping a good BP log]   Interventions for Short Term Goal #1  Reviewed importance of blood pressure control with patient. Provided EMMI education. Reviewed reasons for call MD for elevated blood pressure readings outside target range determined by primary MD. Provided [facility] calendar for patient to record blood pressure readings.   THN CM Short Term Goal #2 (0-30 days)  Patient will report reading his EMMI education on strokes and high  blood pressure in he next 30 days.   THN CM Short Term Goal #2 Start Date  12/19/14 Barrie Folk restarted due to illness.]   [facility] CM Short Term Goal #2 Met Date  01/24/15 [goal not met. patient hospitalized ]   Interventions for Short Term Goal #2  Reviewed importance for patient to read educational material provided      Tomasa Rand, RN, BSN, [facility] [employer] [phone]

## 2015-02-13 DIAGNOSIS — J302 Other seasonal allergic rhinitis: Secondary | ICD-10-CM | POA: Diagnosis not present

## 2015-02-13 DIAGNOSIS — Z0000 Encounter for general adult medical examination without abnormal findings: Secondary | ICD-10-CM | POA: Diagnosis not present

## 2015-02-13 DIAGNOSIS — E7800 Pure hypercholesterolemia, unspecified: Secondary | ICD-10-CM | POA: Diagnosis not present

## 2015-02-13 DIAGNOSIS — D510 Vitamin B12 deficiency anemia due to intrinsic factor deficiency: Secondary | ICD-10-CM | POA: Diagnosis not present

## 2015-02-13 DIAGNOSIS — F329 Major depressive disorder, single episode, unspecified: Secondary | ICD-10-CM | POA: Diagnosis not present

## 2015-02-13 DIAGNOSIS — I639 Cerebral infarction, unspecified: Secondary | ICD-10-CM | POA: Diagnosis not present

## 2015-02-13 DIAGNOSIS — I259 Chronic ischemic heart disease, unspecified: Secondary | ICD-10-CM | POA: Diagnosis not present

## 2015-02-13 DIAGNOSIS — I4891 Unspecified atrial fibrillation: Secondary | ICD-10-CM | POA: Diagnosis not present

## 2015-02-13 DIAGNOSIS — H9190 Unspecified hearing loss, unspecified ear: Secondary | ICD-10-CM | POA: Diagnosis not present

## 2015-02-27 DIAGNOSIS — R4781 Slurred speech: Secondary | ICD-10-CM | POA: Diagnosis not present

## 2015-02-27 DIAGNOSIS — I517 Cardiomegaly: Secondary | ICD-10-CM | POA: Diagnosis not present

## 2015-02-27 DIAGNOSIS — R531 Weakness: Secondary | ICD-10-CM | POA: Diagnosis not present

## 2015-02-27 DIAGNOSIS — I509 Heart failure, unspecified: Secondary | ICD-10-CM | POA: Diagnosis not present

## 2015-02-28 DIAGNOSIS — R600 Localized edema: Secondary | ICD-10-CM | POA: Diagnosis not present

## 2015-02-28 DIAGNOSIS — I951 Orthostatic hypotension: Secondary | ICD-10-CM | POA: Diagnosis not present

## 2015-02-28 DIAGNOSIS — I259 Chronic ischemic heart disease, unspecified: Secondary | ICD-10-CM | POA: Diagnosis not present

## 2015-03-19 DIAGNOSIS — I779 Disorder of arteries and arterioles, unspecified: Secondary | ICD-10-CM | POA: Diagnosis not present

## 2015-03-19 DIAGNOSIS — I10 Essential (primary) hypertension: Secondary | ICD-10-CM | POA: Diagnosis not present

## 2015-03-19 DIAGNOSIS — E7800 Pure hypercholesterolemia, unspecified: Secondary | ICD-10-CM | POA: Diagnosis not present

## 2015-03-19 DIAGNOSIS — I25119 Atherosclerotic heart disease of native coronary artery with unspecified angina pectoris: Secondary | ICD-10-CM | POA: Diagnosis not present

## 2015-03-20 DIAGNOSIS — N400 Enlarged prostate without lower urinary tract symptoms: Secondary | ICD-10-CM | POA: Diagnosis not present

## 2015-03-20 DIAGNOSIS — E7800 Pure hypercholesterolemia, unspecified: Secondary | ICD-10-CM | POA: Diagnosis not present

## 2015-03-20 DIAGNOSIS — I259 Chronic ischemic heart disease, unspecified: Secondary | ICD-10-CM | POA: Diagnosis not present

## 2015-03-20 DIAGNOSIS — M1990 Unspecified osteoarthritis, unspecified site: Secondary | ICD-10-CM | POA: Diagnosis not present

## 2015-03-20 DIAGNOSIS — Z23 Encounter for immunization: Secondary | ICD-10-CM | POA: Diagnosis not present

## 2015-03-20 DIAGNOSIS — J302 Other seasonal allergic rhinitis: Secondary | ICD-10-CM | POA: Diagnosis not present

## 2015-03-21 DIAGNOSIS — I69231 Monoplegia of upper limb following other nontraumatic intracranial hemorrhage affecting right dominant side: Secondary | ICD-10-CM | POA: Diagnosis not present

## 2015-03-30 DIAGNOSIS — R42 Dizziness and giddiness: Secondary | ICD-10-CM | POA: Diagnosis not present

## 2015-03-30 DIAGNOSIS — N390 Urinary tract infection, site not specified: Secondary | ICD-10-CM | POA: Diagnosis not present

## 2015-04-09 DIAGNOSIS — I4891 Unspecified atrial fibrillation: Secondary | ICD-10-CM | POA: Diagnosis not present

## 2015-04-09 DIAGNOSIS — I259 Chronic ischemic heart disease, unspecified: Secondary | ICD-10-CM | POA: Diagnosis not present

## 2015-04-09 DIAGNOSIS — E7800 Pure hypercholesterolemia, unspecified: Secondary | ICD-10-CM | POA: Diagnosis not present

## 2015-04-09 DIAGNOSIS — I639 Cerebral infarction, unspecified: Secondary | ICD-10-CM | POA: Diagnosis not present

## 2015-04-18 DIAGNOSIS — J029 Acute pharyngitis, unspecified: Secondary | ICD-10-CM | POA: Diagnosis not present

## 2015-04-18 DIAGNOSIS — R358 Other polyuria: Secondary | ICD-10-CM | POA: Diagnosis not present

## 2015-04-25 DIAGNOSIS — N312 Flaccid neuropathic bladder, not elsewhere classified: Secondary | ICD-10-CM | POA: Diagnosis not present

## 2015-04-25 DIAGNOSIS — N3020 Other chronic cystitis without hematuria: Secondary | ICD-10-CM | POA: Diagnosis not present

## 2015-04-25 DIAGNOSIS — N318 Other neuromuscular dysfunction of bladder: Secondary | ICD-10-CM | POA: Diagnosis not present

## 2015-04-25 DIAGNOSIS — R351 Nocturia: Secondary | ICD-10-CM | POA: Diagnosis not present

## 2015-04-25 DIAGNOSIS — N401 Enlarged prostate with lower urinary tract symptoms: Secondary | ICD-10-CM | POA: Diagnosis not present

## 2015-04-25 DIAGNOSIS — R3912 Poor urinary stream: Secondary | ICD-10-CM | POA: Diagnosis not present

## 2015-05-11 DIAGNOSIS — Z8673 Personal history of transient ischemic attack (TIA), and cerebral infarction without residual deficits: Secondary | ICD-10-CM | POA: Diagnosis not present

## 2015-05-11 DIAGNOSIS — D649 Anemia, unspecified: Secondary | ICD-10-CM | POA: Diagnosis not present

## 2015-05-11 DIAGNOSIS — E7800 Pure hypercholesterolemia, unspecified: Secondary | ICD-10-CM | POA: Diagnosis not present

## 2015-05-11 DIAGNOSIS — K219 Gastro-esophageal reflux disease without esophagitis: Secondary | ICD-10-CM | POA: Diagnosis not present

## 2015-05-11 DIAGNOSIS — Z79899 Other long term (current) drug therapy: Secondary | ICD-10-CM | POA: Diagnosis not present

## 2015-05-11 DIAGNOSIS — R42 Dizziness and giddiness: Secondary | ICD-10-CM | POA: Diagnosis not present

## 2015-05-11 DIAGNOSIS — R55 Syncope and collapse: Secondary | ICD-10-CM | POA: Diagnosis not present

## 2015-05-11 DIAGNOSIS — I4891 Unspecified atrial fibrillation: Secondary | ICD-10-CM | POA: Diagnosis not present

## 2015-05-11 DIAGNOSIS — I2510 Atherosclerotic heart disease of native coronary artery without angina pectoris: Secondary | ICD-10-CM | POA: Diagnosis not present

## 2015-05-11 DIAGNOSIS — I129 Hypertensive chronic kidney disease with stage 1 through stage 4 chronic kidney disease, or unspecified chronic kidney disease: Secondary | ICD-10-CM | POA: Diagnosis not present

## 2015-05-11 DIAGNOSIS — N189 Chronic kidney disease, unspecified: Secondary | ICD-10-CM | POA: Diagnosis not present

## 2015-05-18 DIAGNOSIS — J40 Bronchitis, not specified as acute or chronic: Secondary | ICD-10-CM | POA: Diagnosis not present

## 2015-05-18 DIAGNOSIS — I69351 Hemiplegia and hemiparesis following cerebral infarction affecting right dominant side: Secondary | ICD-10-CM | POA: Diagnosis not present

## 2015-05-18 DIAGNOSIS — J9811 Atelectasis: Secondary | ICD-10-CM | POA: Diagnosis not present

## 2015-05-18 DIAGNOSIS — Z8679 Personal history of other diseases of the circulatory system: Secondary | ICD-10-CM | POA: Diagnosis not present

## 2015-05-18 DIAGNOSIS — R8299 Other abnormal findings in urine: Secondary | ICD-10-CM | POA: Diagnosis not present

## 2015-05-18 DIAGNOSIS — N189 Chronic kidney disease, unspecified: Secondary | ICD-10-CM | POA: Diagnosis not present

## 2015-05-18 DIAGNOSIS — R4182 Altered mental status, unspecified: Secondary | ICD-10-CM | POA: Diagnosis not present

## 2015-05-18 DIAGNOSIS — R509 Fever, unspecified: Secondary | ICD-10-CM | POA: Diagnosis not present

## 2015-05-18 DIAGNOSIS — R55 Syncope and collapse: Secondary | ICD-10-CM | POA: Diagnosis not present

## 2015-05-18 DIAGNOSIS — K219 Gastro-esophageal reflux disease without esophagitis: Secondary | ICD-10-CM | POA: Diagnosis not present

## 2015-05-18 DIAGNOSIS — I2510 Atherosclerotic heart disease of native coronary artery without angina pectoris: Secondary | ICD-10-CM | POA: Diagnosis not present

## 2015-05-18 DIAGNOSIS — D72829 Elevated white blood cell count, unspecified: Secondary | ICD-10-CM | POA: Diagnosis not present

## 2015-05-18 DIAGNOSIS — I639 Cerebral infarction, unspecified: Secondary | ICD-10-CM | POA: Diagnosis not present

## 2015-05-18 DIAGNOSIS — Z7982 Long term (current) use of aspirin: Secondary | ICD-10-CM | POA: Diagnosis not present

## 2015-05-18 DIAGNOSIS — Z951 Presence of aortocoronary bypass graft: Secondary | ICD-10-CM | POA: Diagnosis not present

## 2015-05-18 DIAGNOSIS — I252 Old myocardial infarction: Secondary | ICD-10-CM | POA: Diagnosis not present

## 2015-05-18 DIAGNOSIS — I6522 Occlusion and stenosis of left carotid artery: Secondary | ICD-10-CM | POA: Diagnosis not present

## 2015-05-18 DIAGNOSIS — I129 Hypertensive chronic kidney disease with stage 1 through stage 4 chronic kidney disease, or unspecified chronic kidney disease: Secondary | ICD-10-CM | POA: Diagnosis not present

## 2015-05-18 DIAGNOSIS — Z79899 Other long term (current) drug therapy: Secondary | ICD-10-CM | POA: Diagnosis not present

## 2015-05-19 DIAGNOSIS — I6523 Occlusion and stenosis of bilateral carotid arteries: Secondary | ICD-10-CM | POA: Diagnosis not present

## 2015-05-19 DIAGNOSIS — I639 Cerebral infarction, unspecified: Secondary | ICD-10-CM | POA: Diagnosis not present

## 2015-05-19 DIAGNOSIS — I340 Nonrheumatic mitral (valve) insufficiency: Secondary | ICD-10-CM | POA: Diagnosis not present

## 2015-05-20 DIAGNOSIS — I6522 Occlusion and stenosis of left carotid artery: Secondary | ICD-10-CM | POA: Diagnosis not present

## 2015-05-21 DIAGNOSIS — J439 Emphysema, unspecified: Secondary | ICD-10-CM | POA: Diagnosis not present

## 2015-05-21 DIAGNOSIS — J42 Unspecified chronic bronchitis: Secondary | ICD-10-CM | POA: Diagnosis not present

## 2015-05-22 ENCOUNTER — Other Ambulatory Visit (HOSPITAL_COMMUNITY): Payer: Self-pay | Admitting: Interventional Radiology

## 2015-05-22 ENCOUNTER — Encounter (HOSPITAL_COMMUNITY): Payer: Self-pay

## 2015-05-22 ENCOUNTER — Ambulatory Visit (HOSPITAL_COMMUNITY)
Admission: RE | Admit: 2015-05-22 | Discharge: 2015-05-22 | Disposition: A | Discharge status: Home / Self Care | Payer: PPO | Source: Ambulatory Visit | Attending: Interventional Radiology | Admitting: Interventional Radiology

## 2015-05-22 ENCOUNTER — Inpatient Hospital Stay (HOSPITAL_COMMUNITY)
Admission: AD | Admit: 2015-05-22 | Discharge: 2015-05-26 | DRG: 038 | Disposition: A | Discharge status: Home Health | Payer: PPO | Source: Ambulatory Visit | Attending: Internal Medicine | Admitting: Internal Medicine

## 2015-05-22 ENCOUNTER — Observation Stay (HOSPITAL_COMMUNITY): Payer: PPO

## 2015-05-22 VITALS — BP 133/64 | HR 51 | Temp 97.5°F | Resp 18 | Ht 69.0 in | Wt 149.0 lb

## 2015-05-22 DIAGNOSIS — I482 Chronic atrial fibrillation, unspecified: Secondary | ICD-10-CM | POA: Diagnosis present

## 2015-05-22 DIAGNOSIS — I6522 Occlusion and stenosis of left carotid artery: Secondary | ICD-10-CM | POA: Diagnosis not present

## 2015-05-22 DIAGNOSIS — N400 Enlarged prostate without lower urinary tract symptoms: Secondary | ICD-10-CM | POA: Diagnosis present

## 2015-05-22 DIAGNOSIS — Z87891 Personal history of nicotine dependence: Secondary | ICD-10-CM

## 2015-05-22 DIAGNOSIS — R5381 Other malaise: Secondary | ICD-10-CM | POA: Diagnosis present

## 2015-05-22 DIAGNOSIS — I63412 Cerebral infarction due to embolism of left middle cerebral artery: Secondary | ICD-10-CM

## 2015-05-22 DIAGNOSIS — I6521 Occlusion and stenosis of right carotid artery: Secondary | ICD-10-CM | POA: Diagnosis not present

## 2015-05-22 DIAGNOSIS — T7840XA Allergy, unspecified, initial encounter: Secondary | ICD-10-CM | POA: Diagnosis present

## 2015-05-22 DIAGNOSIS — D62 Acute posthemorrhagic anemia: Secondary | ICD-10-CM | POA: Diagnosis not present

## 2015-05-22 DIAGNOSIS — R0602 Shortness of breath: Secondary | ICD-10-CM

## 2015-05-22 DIAGNOSIS — I10 Essential (primary) hypertension: Secondary | ICD-10-CM | POA: Diagnosis present

## 2015-05-22 DIAGNOSIS — Z79899 Other long term (current) drug therapy: Secondary | ICD-10-CM | POA: Diagnosis not present

## 2015-05-22 DIAGNOSIS — Z7902 Long term (current) use of antithrombotics/antiplatelets: Secondary | ICD-10-CM | POA: Diagnosis not present

## 2015-05-22 DIAGNOSIS — I6350 Cerebral infarction due to unspecified occlusion or stenosis of unspecified cerebral artery: Secondary | ICD-10-CM

## 2015-05-22 DIAGNOSIS — I6529 Occlusion and stenosis of unspecified carotid artery: Secondary | ICD-10-CM | POA: Insufficient documentation

## 2015-05-22 DIAGNOSIS — E785 Hyperlipidemia, unspecified: Secondary | ICD-10-CM | POA: Diagnosis not present

## 2015-05-22 DIAGNOSIS — Z951 Presence of aortocoronary bypass graft: Secondary | ICD-10-CM

## 2015-05-22 DIAGNOSIS — Z8673 Personal history of transient ischemic attack (TIA), and cerebral infarction without residual deficits: Secondary | ICD-10-CM

## 2015-05-22 DIAGNOSIS — R296 Repeated falls: Secondary | ICD-10-CM | POA: Diagnosis present

## 2015-05-22 DIAGNOSIS — I63132 Cerebral infarction due to embolism of left carotid artery: Principal | ICD-10-CM | POA: Diagnosis present

## 2015-05-22 DIAGNOSIS — I639 Cerebral infarction, unspecified: Secondary | ICD-10-CM | POA: Diagnosis present

## 2015-05-22 DIAGNOSIS — I252 Old myocardial infarction: Secondary | ICD-10-CM

## 2015-05-22 DIAGNOSIS — R001 Bradycardia, unspecified: Secondary | ICD-10-CM | POA: Diagnosis not present

## 2015-05-22 DIAGNOSIS — I2510 Atherosclerotic heart disease of native coronary artery without angina pectoris: Secondary | ICD-10-CM | POA: Diagnosis present

## 2015-05-22 DIAGNOSIS — I6789 Other cerebrovascular disease: Secondary | ICD-10-CM | POA: Diagnosis not present

## 2015-05-22 DIAGNOSIS — I2119 ST elevation (STEMI) myocardial infarction involving other coronary artery of inferior wall: Secondary | ICD-10-CM | POA: Diagnosis not present

## 2015-05-22 DIAGNOSIS — Z66 Do not resuscitate: Secondary | ICD-10-CM | POA: Diagnosis present

## 2015-05-22 DIAGNOSIS — I219 Acute myocardial infarction, unspecified: Secondary | ICD-10-CM | POA: Diagnosis present

## 2015-05-22 DIAGNOSIS — I6523 Occlusion and stenosis of bilateral carotid arteries: Secondary | ICD-10-CM | POA: Diagnosis not present

## 2015-05-22 HISTORY — DX: Other complications of anesthesia, initial encounter: T88.59XA

## 2015-05-22 HISTORY — DX: Personal history of other diseases of the digestive system: Z87.19

## 2015-05-22 HISTORY — DX: Heart failure, unspecified: I50.9

## 2015-05-22 HISTORY — DX: Chronic atrial fibrillation, unspecified: I48.20

## 2015-05-22 HISTORY — DX: Personal history of other medical treatment: Z92.89

## 2015-05-22 HISTORY — DX: Personal history of peptic ulcer disease: Z87.11

## 2015-05-22 HISTORY — DX: Major depressive disorder, single episode, unspecified: F32.9

## 2015-05-22 HISTORY — DX: Adverse effect of unspecified anesthetic, initial encounter: T41.45XA

## 2015-05-22 HISTORY — DX: Depression, unspecified: F32.A

## 2015-05-22 HISTORY — DX: Cerebral infarction, unspecified: I63.9

## 2015-05-22 LAB — URINALYSIS, ROUTINE W REFLEX MICROSCOPIC
Bilirubin Urine: NEGATIVE
Glucose, UA: NEGATIVE mg/dL
Hgb urine dipstick: NEGATIVE
Ketones, ur: 15 mg/dL — AB
LEUKOCYTES UA: NEGATIVE
Nitrite: NEGATIVE
PROTEIN: NEGATIVE mg/dL
SPECIFIC GRAVITY, URINE: 1.038 — AB (ref 1.005–1.030)
pH: 6 (ref 5.0–8.0)

## 2015-05-22 LAB — CREATININE, SERUM
CREATININE: 1.32 mg/dL — AB (ref 0.61–1.24)
GFR calc Af Amer: 56 mL/min — ABNORMAL LOW (ref >60)
GFR calc non Af Amer: 48 mL/min — ABNORMAL LOW (ref >60)

## 2015-05-22 LAB — CBC
HCT: 46.1 % (ref 39.0–52.0)
HEMOGLOBIN: 14.8 g/dL (ref 13.0–17.0)
MCH: 28.8 pg (ref 26.0–34.0)
MCHC: 32.1 g/dL (ref 30.0–36.0)
MCV: 89.9 fL (ref 78.0–100.0)
Platelets: 168 10*3/uL (ref 150–400)
RBC: 5.13 MIL/uL (ref 4.22–5.81)
RDW: 14 % (ref 11.5–15.5)
WBC: 10.7 10*3/uL — ABNORMAL HIGH (ref 4.0–10.5)

## 2015-05-22 MED ORDER — SODIUM CHLORIDE 0.9% FLUSH
3.0000 mL | Freq: Two times a day (BID) | INTRAVENOUS | Status: DC
  Administered 2015-05-23 – 2015-05-26 (×5): 3 mL via INTRAVENOUS

## 2015-05-22 MED ORDER — IOPAMIDOL (ISOVUE-300) INJECTION 61%
INTRAVENOUS | Status: AC
  Administered 2015-05-22: 80 mL
  Filled 2015-05-22: qty 150

## 2015-05-22 MED ORDER — PROSIGHT PO TABS
1.0000 | ORAL_TABLET | Freq: Every morning | ORAL | Status: DC
  Administered 2015-05-23 – 2015-05-26 (×3): 1 via ORAL
  Filled 2015-05-22 (×4): qty 1

## 2015-05-22 MED ORDER — SODIUM CHLORIDE 0.9 % IV SOLN
INTRAVENOUS | Status: DC
  Administered 2015-05-22 – 2015-05-24 (×5): via INTRAVENOUS

## 2015-05-22 MED ORDER — FENTANYL CITRATE (PF) 100 MCG/2ML IJ SOLN
INTRAMUSCULAR | Status: AC | PRN
  Administered 2015-05-22: 25 ug via INTRAVENOUS

## 2015-05-22 MED ORDER — HEPARIN SODIUM (PORCINE) 5000 UNIT/ML IJ SOLN
5000.0000 [IU] | Freq: Three times a day (TID) | INTRAMUSCULAR | Status: DC
  Administered 2015-05-22 – 2015-05-24 (×5): 5000 [IU] via SUBCUTANEOUS
  Filled 2015-05-22 (×5): qty 1

## 2015-05-22 MED ORDER — FINASTERIDE 5 MG PO TABS
5.0000 mg | ORAL_TABLET | Freq: Every day | ORAL | Status: DC
  Administered 2015-05-23 – 2015-05-26 (×3): 5 mg via ORAL
  Filled 2015-05-22 (×3): qty 1

## 2015-05-22 MED ORDER — HYDROCODONE-ACETAMINOPHEN 5-325 MG PO TABS
1.0000 | ORAL_TABLET | ORAL | Status: DC | PRN
  Administered 2015-05-24: 2 via ORAL
  Administered 2015-05-25 (×2): 1 via ORAL
  Administered 2015-05-26: 2 via ORAL
  Filled 2015-05-22: qty 2
  Filled 2015-05-22 (×2): qty 1
  Filled 2015-05-22: qty 2

## 2015-05-22 MED ORDER — ONDANSETRON HCL 4 MG PO TABS: 4.0000 mg | ORAL_TABLET | Freq: Four times a day (QID) | ORAL | Status: DC | PRN

## 2015-05-22 MED ORDER — ATORVASTATIN CALCIUM 40 MG PO TABS
40.0000 mg | ORAL_TABLET | Freq: Every day | ORAL | Status: DC
  Administered 2015-05-23 – 2015-05-25 (×3): 40 mg via ORAL
  Filled 2015-05-22 (×3): qty 1

## 2015-05-22 MED ORDER — MIRTAZAPINE 15 MG PO TABS
15.0000 mg | ORAL_TABLET | Freq: Every day | ORAL | Status: DC
  Administered 2015-05-22 – 2015-05-25 (×4): 15 mg via ORAL
  Filled 2015-05-22 (×4): qty 1

## 2015-05-22 MED ORDER — CARVEDILOL 12.5 MG PO TABS
12.5000 mg | ORAL_TABLET | Freq: Every day | ORAL | Status: DC
  Administered 2015-05-22: 12.5 mg via ORAL
  Filled 2015-05-22: qty 1

## 2015-05-22 MED ORDER — HEPARIN SOD (PORK) LOCK FLUSH 100 UNIT/ML IV SOLN
INTRAVENOUS | Status: AC
Start: 2015-05-22 — End: 2015-05-23
  Filled 2015-05-22: qty 20

## 2015-05-22 MED ORDER — FENTANYL CITRATE (PF) 100 MCG/2ML IJ SOLN
INTRAMUSCULAR | Status: AC
  Filled 2015-05-22: qty 2

## 2015-05-22 MED ORDER — DOCUSATE SODIUM 100 MG PO CAPS
100.0000 mg | ORAL_CAPSULE | Freq: Every day | ORAL | Status: DC
  Administered 2015-05-23: 100 mg via ORAL
  Filled 2015-05-22: qty 1

## 2015-05-22 MED ORDER — LIDOCAINE HCL 1 % IJ SOLN
INTRAMUSCULAR | Status: AC
  Filled 2015-05-22: qty 20

## 2015-05-22 MED ORDER — GUAIFENESIN 400 MG PO TABS: 400.0000 mg | ORAL_TABLET | ORAL | Status: DC

## 2015-05-22 MED ORDER — HEPARIN SODIUM (PORCINE) 1000 UNIT/ML IJ SOLN
INTRAMUSCULAR | Status: AC | PRN
  Administered 2015-05-22: 1000 [IU] via INTRAVENOUS

## 2015-05-22 MED ORDER — ASPIRIN 325 MG PO TABS
325.0000 mg | ORAL_TABLET | Freq: Every day | ORAL | Status: DC
  Administered 2015-05-23: 325 mg via ORAL
  Filled 2015-05-22: qty 1

## 2015-05-22 MED ORDER — LORATADINE 10 MG PO TABS
10.0000 mg | ORAL_TABLET | Freq: Every day | ORAL | Status: DC
  Administered 2015-05-23 – 2015-05-26 (×3): 10 mg via ORAL
  Filled 2015-05-22 (×3): qty 1

## 2015-05-22 MED ORDER — MIDAZOLAM HCL 2 MG/2ML IJ SOLN
INTRAMUSCULAR | Status: AC
  Filled 2015-05-22: qty 2

## 2015-05-22 MED ORDER — LIDOCAINE HCL 1 % IJ SOLN
INTRAMUSCULAR | Status: AC | PRN
  Administered 2015-05-22: 8 mL

## 2015-05-22 MED ORDER — ONDANSETRON HCL 4 MG/2ML IJ SOLN: 4.0000 mg | Freq: Four times a day (QID) | INTRAMUSCULAR | Status: DC | PRN

## 2015-05-22 MED ORDER — MIDAZOLAM HCL 2 MG/2ML IJ SOLN
INTRAMUSCULAR | Status: AC | PRN
  Administered 2015-05-22: 1 mg via INTRAVENOUS

## 2015-05-22 NOTE — Procedures (Signed)
S/P 4 vessel cerebral arteriogram RT CFA approach. Findings. 1.Approx 85 % stenosis of LT ICA prox. 2.Approx 90 % stenosis RT ICA cavernous seg with post stenotic dilatation

## 2015-05-22 NOTE — Progress Notes (Signed)
Patient ID: Dave Harris, male   DOB: 12/27/1929, 82 y.o.   MRN: [id number] INR  Results of the angiogram discussed with the patient and daughters. Informed them of the symptomatic 85% Lt ICa prox stenosis,and Rt ICA 90% cavernous seg stenosis. Presently has mild RT UE and RT LE weakness.denies any  Visual symptoms ,speech difficulties .Denies any new syncope episodes  Or seizures.  Discussed findings with Dr Aldona Lento MD ,neurohospitalist . Patient to be admitted for further management of symptomatic LT ICA stenosis.Arlean Hopping MD

## 2015-05-22 NOTE — Sedation Documentation (Signed)
Patient is resting comfortably. 

## 2015-05-22 NOTE — H&P (Signed)
Chief Complaint:left carotid artery stenosis  Referring Physician:Dr. Samer Harris  Supervising Physician: Dave Harris  HPI: Dave Harris is an 82 y.o. male who has an extensive history since June 2016 of multiple CVAs.  He has been on eliquis and this was stopped and as well as plavix, but that causes a horrible burning sensation throughout his body so this has been held as well.  He was admitted to [facility] this admission secondary to another CVA.  A [place] and CTA of his neck have been completed and show high grade stenosis of his left carotid artery.  We have been asked to perform a cerebral angiogram on the patient to determine if he needs intervention.  Past Medical History:  Past Medical History  Diagnosis Date  . Hyperlipidemia   . Heart disease   . Atrial fibrillation ([place])   . CAD (coronary artery disease)   . Stroke ([place])   . Allergy   . Hypertension   . Myocardial infarction [facility])     Past Surgical History:  Past Surgical History  Procedure Laterality Date  . Back surgery    . Carpal tunnel release    . Hernia repair    . Prostatectomy    . Coronary artery bypass graft    . Colon surgery    . Tonsillectomy      Family History:  Family History  Problem Relation Age of Onset  . CAD    . Heart disease Brother     before age 4  . Heart attack Brother     Social History:  reports that he quit smoking about 21 years ago. He has never used smokeless tobacco. He reports that he does not drink alcohol or use illicit drugs.  Allergies:  Allergies  Allergen Reactions  . Plavix [Clopidogrel Bisulfate]   . Xarelto [Rivaroxaban] Other (See Comments)    bleeding    Medications:   Medication List    ASK your doctor about these medications        apixaban 2.5 MG Tabs tablet  Commonly known as:  ELIQUIS  Take 2.5 mg by mouth 2 (two) times daily.     aspirin 325 MG tablet  Take 325 mg by mouth daily.     atorvastatin 40 MG tablet    Commonly known as:  LIPITOR  Take 40 mg by mouth daily.     carvedilol 3.125 MG tablet  Commonly known as:  COREG  Take 3.125 mg by mouth 2 (two) times daily with a meal.     docusate sodium 100 MG capsule  Commonly known as:  COLACE  Take 100 mg by mouth 1 day or 1 dose. Takes as needed. Usually once a week.     fexofenadine 180 MG tablet  Commonly known as:  ALLEGRA  Take 180 mg by mouth daily.     guaifenesin 400 MG Tabs tablet  Commonly known as:  HUMIBID E  Take 400 mg by mouth every 4 (four) hours. Take as needed only.     mirtazapine 15 MG tablet  Commonly known as:  REMERON  Take 15 mg by mouth at bedtime.     PRESERVISION AREDS PO  Take 1 tablet by mouth every morning.        Please HPI for pertinent positives, otherwise complete 10 system ROS negative, except he has gait abnormalities and uses a walker as he has some left sided weakness secondary to his CVAs.  Mallampati Score: MD Evaluation  Airway: WNL Heart: WNL Abdomen: WNL Chest/ Lungs: WNL ASA  Classification: 3 Mallampati/Airway Score: One  Physical Exam: BP 148/76 mmHg  Pulse 67  Resp 18  SpO2 99% There is no weight on file to calculate BMI. General: pleasant, frail, elderly white male who is laying in bed in NAD HEENT: head is normocephalic, atraumatic.  Sclera are noninjected.  PERRL.  Ears and nose without any masses or lesions.  Mouth is pink and moist Heart: irregularly irregular.  Normal s1,s2. No obvious murmurs, gallops, or rubs noted.  Palpable radial and pedal pulses bilaterally Lungs: CTAB, no wheezes, rhonchi, or rales noted.  Respiratory effort nonlabored Abd: soft, NT, ND, +BS, no masses, hernias, or organomegaly MS: all 4 extremities are symmetrical with no cyanosis, clubbing, or edema. Psych: A&Ox3 with an appropriate affect.   Labs: No results found for this or any previous visit (from the past 48 hour(s)).  Imaging: No results found.  Assessment/Plan 1. Left high-grade  carotid artery stenosis -will plan for cerebral angiogram today and if he needs intervention, the patient may need to be transferred here so that Dr. Estanislado Harris can proceed with that in the coming days.  This will be decided after his procedure today. -the patient has been NPO and labs from [place] are WNL -Risks and Benefits discussed with the patient including, but not limited to bleeding, infection, vascular injury or contrast induced renal failure. All of the patient's questions were answered, patient is agreeable to proceed. Consent signed and in chart.   Thank you for this interesting consult.  I greatly enjoyed meeting Dave Harris and look forward to participating in their care.  A copy of this report was sent to the requesting provider on this date.  Electronically Signed: Henreitta Harris 05/22/2015, 1:11 PM   I spent a total of    40 minutes in face to face in clinical consultation, greater than 50% of which was counseling/coordinating care for left carotid artery stenosis

## 2015-05-22 NOTE — H&P (Addendum)
TRH H&P   Patient Demographics:    Dave Harris, is a 82 y.o. male  MRN: [id number]   DOB - January 07, 1930  Admit Date - (Not on file)  Outpatient Primary MD for the patient is [facility] Angelique Blonder., MD  Referring MD/NP/PA: Dr Estanislado Pandy IR  Outpatient Specialists: Dr Arita [facility]  Patient coming from: [facility] IR suite  No chief complaint on file.     HPI:    Dave Harris  is a 82 y.o. male,  History of CAD status post CABG over 10 years ago, chronic atrial fibrillation, multiple CVAs, history of carotid artery stenosis, essential hypertension, dyslipidemia, who has had multiple strokes in the past but recently had another stroke about 4-5 days ago, Her entry he has had 5-6 small strokes in the recent past. At [facility] he underwent echogram which was unremarkable, he and her when CT angiogram showing bilateral carotid stenosis, he also has evidence of left embolic CVA on MRI at [facility], his case was discussed with interventional radiologist Dr. Estanislado Pandy who accepted the patient in transfer into the outpatient IR department.  In the IR department he underwent carotid angiogram confirming bilateral carotid artery disease with at least 80-90% stenosis on the left side which is the same side of his recent stroke, in the IR department he was seen by neurology and I was called to admit the patient in the IR department.  Patient is a poor historian but currently symptom-free, he says he cannot take Plavix due to severe reaction, he was placed on Eliquis for chronic A. fib but this was stopped 1 month ago as patient is afraid of bleeding. He says that he is actually never bled but he is afraid of the same. He is  currently on aspirin and statin for secondary prevention. He is being admitted as left carotid stenosis needs to be intervened within the next few days this admission.    Review of systems:    In addition to the HPI above,   No Fever-chills, No Headache, No changes with Vision or hearing, No problems swallowing food or Liquids, No Chest pain, Cough or Shortness of Breath, No Abdominal pain, No Nausea or Vommitting, Bowel movements are regular, No Blood in stool or Urine, No dysuria, No new skin rashes or bruises, No new joints pains-aches,  No new weakness,  tingling, numbness in any extremity, No recent weight gain or loss, No polyuria, polydypsia or polyphagia, No significant Mental Stressors.  A full 10 point Review of Systems was done, except as stated above, all other Review of Systems were negative.   With Past History of the following :    Past Medical History  Diagnosis Date  . Hyperlipidemia   . Heart disease   . Chronic atrial fibrillation ([place])   . CAD (coronary artery disease)   . Stroke ([place])   . Allergy   . Hypertension   . Myocardial infarction [facility])       Past Surgical History  Procedure Laterality Date  . Back surgery    . Carpal tunnel release    . Hernia repair    . Prostatectomy    . Coronary artery bypass graft    . Colon surgery    . Tonsillectomy        Social History:     Social History  Substance Use Topics  . Smoking status: Former Smoker    Quit date: 11/10/1993  . Smokeless tobacco: Never Used  . Alcohol Use: No     Lives - at home, walks by self      Family History :     Family History  Problem Relation Age of Onset  . CAD    . Heart disease Brother     before age 51  . Heart attack Brother        Home Medications:   Prior to Admission medications   Medication Sig Start Date End Date Taking? Authorizing Provider  aspirin 325 MG tablet Take 81 mg by mouth daily.    Yes Historical Provider, MD  atorvastatin  (LIPITOR) 40 MG tablet Take 40 mg by mouth daily.   Yes Historical Provider, MD  carvedilol (COREG) 3.125 MG tablet Take 12.5 mg by mouth at bedtime.    Yes Historical Provider, MD  fexofenadine (ALLEGRA) 180 MG tablet Take 180 mg by mouth daily.   Yes Historical Provider, MD  finasteride (PROSCAR) 5 MG tablet Take 5 mg by mouth daily.   Yes Historical Provider, MD  levofloxacin (LEVAQUIN) 750 MG tablet Inject 750 mg into the vein daily.   Yes Historical Provider, MD  mirtazapine (REMERON) 15 MG tablet Take 15 mg by mouth at bedtime.   Yes Historical Provider, MD  Multiple Vitamins-Minerals (PRESERVISION AREDS PO) Take 1 tablet by mouth every morning.   Yes Historical Provider, MD  apixaban (ELIQUIS) 2.5 MG TABS tablet Take 2.5 mg by mouth 2 (two) times daily. Reported on 05/22/2015    Historical Provider, MD  docusate sodium (COLACE) 100 MG capsule Take 100 mg by mouth 1 day or 1 dose. Reported on 05/22/2015    Historical Provider, MD  guaifenesin (HUMIBID E) 400 MG TABS tablet Take 400 mg by mouth every 4 (four) hours. Reported on 05/22/2015    Historical Provider, MD     Allergies:     Allergies  Allergen Reactions  . Plavix [Clopidogrel Bisulfate]   . Xarelto [Rivaroxaban] Other (See Comments)    bleeding     Physical Exam:   Vitals  Blood pressure 141/83, pulse 66, resp. rate 19, SpO2 97 %.   1. General Pleasant elderly frail white male lying in bed in NAD,     2. Normal affect and insight, Not Suicidal or Homicidal, Awake Alert, Oriented X 3.  3. No F.N deficits, ALL C.Nerves Intact, Strength 5/5 all 4 extremities, Sensation intact  all 4 extremities, Plantars down going.  4. Ears and Eyes appear Normal, Conjunctivae clear, PERRLA. Moist Oral Mucosa.  5. Supple Neck, No JVD, No cervical lymphadenopathy appriciated, No Carotid Bruits.  6. Symmetrical Chest wall movement, Good air movement bilaterally, CTAB.  7. RRR, No Gallops, Rubs or Murmurs, No Parasternal Heave.  8.  Positive Bowel Sounds, Abdomen Soft, No tenderness, No organomegaly appriciated,No rebound -guarding or rigidity.  9.  No Cyanosis, Normal Skin Turgor, No Skin Rash or Bruise.  10. Good muscle tone,  joints appear normal , no effusions, Normal ROM.  11. No Palpable Lymph Nodes in Neck or Axillae     Data Review:   From [place]  WBC 12K, HB 14.5, Plts 145, Na 140, K 4, Creat 1, CBG 108, Mag 2, LDL 55,  ----------------------------------------------------------------------------------------------------------------   Imaging Results:    No results found.  My personal review of EKG: Rhythm NSR, incomplete Branch Block, no Acute ST changes   Assessment & Plan:     1. Multiple CVAs recent left embolic CVA likely source left 80-90% carotid artery stenosis. This happened 2-3 days ago at [place], his workup was done at the [place] done there was unremarkable with no embolic source EF [id number],  LDL was 55, he has already been seen by neurologist Dr. Leonel Ramsay, I discussed the case with him. For now continue aspirin and statin. I have already consulted vascular surgery for definitive treatment of his right-sided carotid stenosis. Patient definitely will be a high-risk candidate for adverse cardiopulmonary outcome during the perioperative period, we'll continue his beta blocker and monitor him on telemetry.   2. CAD status post CABG. For now continue beta blocker, aspirin and statin for secondary prevention. Monitor on telemetry. EKG nonacute.  3. Chronic atrial fibrillation. [place] vasc 2 score of greater than 5. Continue Coreg goal will be rate controlled, telemetry monitor, patient has refused anticoagulation.  4. Dyslipidemia. Continue statin. Check fasting lipid panel in the morning.  5. BPH. On Proscar which will be continued.  6. Bilateral carotid disease with left-sided tight stenosis likely the culprit lesion for his CVA. Plan as in #1 above.   DVT Prophylaxis  Heparin  AM Labs Ordered, also please review Full Orders  Family Communication: Admission, patients condition and plan of care including tests being ordered have been discussed with the patient who indicates understanding and agree with the plan and Code Status.  Code Status Full  Likely DC to  Home 2-3 days  Condition GUARDED    Consults called: Vascular, neurology, IR  Admission status: Inpatient Tele   Time spent in minutes : 35   Lala Lund K M.D on 05/22/2015 at 5:30 PM  Between 7am to 7pm - Pager - [phone]. After 7pm go to [email or web address] - password [facility]  [facility] - Office  [phone]

## 2015-05-22 NOTE — Consult Note (Signed)
Neurology Consultation Reason for Consult: Stroke Referring Physician: Juliet Rude  CC: Syncope  History is obtained from: Patient  HPI: Dave Harris is a 82 y.o. male with a history of atrial fibrillation, coronary artery disease who is managed on eloquent is up until about a month ago at which point he stopped taking it due to being told "if you fall and hit your head, you could bleed."  However, the patient is a poor historian. He presented to [facility] where an echocardiogram was done showing no embolic source, CT angiogram was performed which showed severe stenosis of bilateral carotids. He was referred for cerebral angiogram to Dr. Estanislado Pandy. Following the procedure, it was felt that he had symptomatic carotid stenosis and therefore it is recommended that he be admitted for continued observation pending intervention.  He reports that he has had 6 strokes over the past few months, though I do not have the records for all of these.   LKW: Saturday tpa given?: no, out of window  ROS: A 14 point ROS was performed and is negative except as noted in the HPI.   Past Medical History  Diagnosis Date  . Hyperlipidemia   . Heart disease   . Atrial fibrillation ([place])   . CAD (coronary artery disease)   . Stroke ([place])   . Allergy   . Hypertension   . Myocardial infarction [facility])      Family History  Problem Relation Age of Onset  . CAD    . Heart disease Brother     before age 21  . Heart attack Brother      Social History:  reports that he quit smoking about 21 years ago. He has never used smokeless tobacco. He reports that he does not drink alcohol or use illicit drugs.   Exam: Current vital signs: BP 124/85 mmHg  Pulse 69  Resp 19  SpO2 100% Vital signs in last 24 hours: Pulse Rate:  [60-71] 69 (04/11 1603) Resp:  [14-21] 19 (04/11 1603) BP: (124-151)/(64-88) 124/85 mmHg (04/11 1603) SpO2:  [99 %-100 %] 100 % (04/11 1603)   Physical Exam   Constitutional: Appears well-developed and well-nourished.  Psych: Affect appropriate to situation Eyes: No scleral injection HENT: No OP obstrucion Head: Normocephalic.  Cardiovascular: Normal rate   Respiratory: Effort normal  GI: Soft.  No distension. There is no tenderness.  Skin: WDI  Neuro: Mental Status: Patient is awake, alert, oriented to person, place, month, year, and situation. Patient is able to give a clear and coherent history, though is a poor historian. No signs of aphasia or neglect Cranial Nerves: II: Visual Fields are full. Pupils are equal, round, and reactive to light.   III,IV, VI: EOMI without ptosis or diploplia.  V: Facial sensation is symmetric to temperature VII: Facial movement is symmetric.  VIII: hearing is intact to voice X: Uvula elevates symmetrically XI: Shoulder shrug is symmetric. XII: tongue is midline without atrophy or fasciculations.  Motor: Tone is normal. Bulk is normal. 5/5 strength was present in all four extremities, though mild drift in the right leg Sensory: Sensation is diminished in the right arm and leg Cerebellar: FNF and HKS are intact bilaterally   I have reviewed labs in epic and the results pertinent to this consultation are: Creatinine 1  I have reviewed the images obtained: MRI brain from [place], small infarct in the subcortical white matter on the left  Impression: 82 year old male with bilateral carotid stenosis and recurrent embolic infarcts. It  is difficult to say if this is due to his atrial fibrillation orifice does truly represent symptomatically and stenosis. Certainly given that he lost consciousness with this event, would argue that there is at least some contribution from the contralateral carotid stenosis as bilateral hypoperfusion has to develop in order to lose consciousness.  Given that the recommendation is for intervention within 2 weeks for a symptomatic carotid stenosis, I do think that admission  for further observation and consultation with his vascular surgeon is indicated.  He states that he only stopped his anticoagulation one month ago, and therefore multiple strokes occurred while he was on anticoagulation which could lend credence to the possibility that this is symptomatic carotid disease.  Recommendations: 1) admission for continued observation 2) every 4 hours neuro checks 3) consultation with his vascular surgeon 4) stroke team will continue to follow starting 4/12. 5) may need to reconsider anticoagulation following    Roland Rack, MD [facility] [phone]  If 7pm- 7am, please page neurology on call as listed in [place].

## 2015-05-22 NOTE — Consult Note (Addendum)
[facility] [place]  Patient name: Dave Harris MRN: [id number] DOB: 10-09-1929 Sex: male  REASON FOR CONSULT: Left brain stroke and Left carotid stenosis. Consult is from Dr. Candiss Norse.  HPI: Dave Harris is a 82 y.o. male, who is transferred from [facility] for a cerebral arteriogram. He was admitted there 4 days ago after an episode of syncope, expressive aphasia, and some right-sided weakness. He has had multiple strokes in the past. CT angiogram at [facility] showed bilateral carotid stenoses and evidence of an embolic stroke to the left brain. He was transported for a cerebral arteriogram. Of note, echo at the referring hospital showed no cardiac source for embolization. He is on aspirin and is on a statin.  The patient is not a great historian. He states that he's had multiple previous strokes in the past which were usually associated with blacking out. He's also describes some right-sided weakness. He does have a history of atrial fibrillation and had been on Coumadin but this was stopped because of some hemoptysis.  The patient has been seen in consultation by neurology who feel that likely the left carotid stenosis as the source of his symptoms.  The patient was seen in our office on 04/03/2014 by Dr. Annamarie Major with a asymptomatic left carotid stenosis. At that time he had a 60-79% left carotid stenosis and no significant right carotid stenosis. Was to come back in 6 months for a follow up duplex scan.  Past Medical History  Diagnosis Date  . Hyperlipidemia   . Heart disease   . Chronic atrial fibrillation ([place])   . CAD (coronary artery disease)   . Stroke ([place])   . Allergy   . Hypertension   . Myocardial infarction [facility])     Family History  Problem Relation Age of Onset  . CAD    . Heart disease Brother     before age 41  . Heart attack Brother     SOCIAL HISTORY: Social History   Social History  . Marital  Status: Married    Spouse Name: N/A  . Number of Children: N/A  . Years of Education: N/A   Occupational History  . Not on file.   Social History Main Topics  . Smoking status: Former Smoker    Quit date: 11/10/1993  . Smokeless tobacco: Never Used  . Alcohol Use: No  . Drug Use: No  . Sexual Activity: No   Other Topics Concern  . Not on file   Social History Narrative    Allergies  Allergen Reactions  . Plavix [Clopidogrel Bisulfate]   . Xarelto [Rivaroxaban] Other (See Comments)    bleeding    Current Facility-Administered Medications  Medication Dose Route Frequency Provider Last Rate Last Dose  . aspirin tablet 325 mg  325 mg Oral Daily Thurnell Lose, MD      . atorvastatin (LIPITOR) tablet 40 mg  40 mg Oral Daily Thurnell Lose, MD      . carvedilol (COREG) tablet 12.5 mg  12.5 mg Oral QHS Thurnell Lose, MD      . docusate sodium (COLACE) capsule 100 mg  100 mg Oral 1 day or 1 dose Thurnell Lose, MD      . finasteride (PROSCAR) tablet 5 mg  5 mg Oral Daily Thurnell Lose, MD      . guaifenesin (HUMIBID E) tablet 400 mg  400 mg Oral 6 times per day Thurnell Lose, MD      .  loratadine (CLARITIN) tablet 10 mg  10 mg Oral Daily Thurnell Lose, MD      . mirtazapine (REMERON) tablet 15 mg  15 mg Oral QHS Thurnell Lose, MD      . Derrill Memo ON 05/23/2015] PRESERVISION AREDS CAPS   Oral q morning - 10a Thurnell Lose, MD       Facility-Administered Medications Ordered in Other Encounters  Medication Dose Route Frequency Provider Last Rate Last Dose  . fentaNYL (SUBLIMAZE) 100 MCG/2ML injection           . heparin lock flush 100 UNIT/ML injection           . lidocaine (XYLOCAINE) 1 % (with pres) injection           . midazolam (VERSED) 2 MG/2ML injection             REVIEW OF SYSTEMS:  [X]  denotes positive finding, [ ]  denotes negative finding Cardiac  Comments:  Chest pain or chest pressure: X Describes some chest pressure to me that occurred on the  day of his stroke 4 days ago.  Shortness of breath upon exertion:    Short of breath when lying flat:    Irregular heart rhythm: X He has atrial fibrillation.      Vascular    Pain in calf, thigh, or hip brought on by ambulation:    Pain in feet at night that wakes you up from your sleep:     Blood clot in your veins:    Leg swelling:         Pulmonary    Oxygen at home:    Productive cough:     Wheezing:         Neurologic    Sudden weakness in arms or legs:  X Right upper extremity and right lower extremity  Sudden numbness in arms or legs:  X Upper extremity and right lower extremity  Sudden onset of difficulty speaking or slurred speech: X   Temporary loss of vision in one eye:     Problems with dizziness:         Gastrointestinal    Blood in stool:     Vomited blood:         Genitourinary    Burning when urinating:     Blood in urine:        Psychiatric    Major depression:         Hematologic    Bleeding problems:    Problems with blood clotting too easily:        Skin    Rashes or ulcers:        Constitutional    Fever or chills:      PHYSICAL EXAM: Filed Vitals:   05/22/15 1556 05/22/15 1603 05/22/15 1633 05/22/15 1712  BP: 151/64 124/85 147/82 141/83  Pulse: 60 69 62 66  Resp: 16 19    SpO2: 100% 100%  97%    GENERAL: The patient is a well-nourished male, in no acute distress. The vital signs are documented above. CARDIAC: There is a regular rate and rhythm.  VASCULAR: do not detect carotid bruits. He has a dressing in the right groin after his recent cath and I could not assess his right femoral pulse. He does have a palpable popliteal and dorsalis pedis pulse on the right. On the left side he has a palpable femoral, popliteal, and dorsalis pedis pulse. PULMONARY: There is good air exchange bilaterally without wheezing or  rales. ABDOMEN: Soft and non-tender with normal pitched bowel sounds.  MUSCULOSKELETAL: There are no major deformities or  cyanosis. NEUROLOGIC: No focal weakness or paresthesias are detected. SKIN: There are no ulcers or rashes noted. PSYCHIATRIC: The patient has a normal affect.  DATA:   MRI: MRI at [facility] showed small infarcts in the subcortical white matter on the left.  CT HEAD: His CT of the head on 05/18/15 showed no acute stroke.  CAROTID DUPLEX: His carotid duplex at [facility] on 05/19/2015 showed a 70-99% left carotid stenosis.  CEREBRAL ANGIOGRAM: I have reviewed his cerebral arteriogram from today. He has a significant 80% right carotid stenosis which is quite irregular and likely explains his symptoms. There is no significant carotid stenosis on the right.   MEDICAL ISSUES:  SYMPTOMATIC LEFT CAROTID STENOSIS: This patient has a greater than 80% symptomatic left carotid stenosis. He's had multiple strokes in the past and I would agree that he needs a left carotid endarterectomy. He does have a significant cardiac history and is had a previous myocardial infarction in addition to having undergone previous CABG. I will last the primary service to decide if he needs preoperative cardiac clearance. I can potentially do his left carotid endarterectomy on Thursday if he is cleared surgically. If he cannot be cleared for Thursday then his surgery would have to be next week given the holiday. I have discussed the indications for left carotid endarterectomy and the potential complications, including but not limited to bleeding, wound healing problems, nerve injury, stroke (perioperative risk 1-2%), MI, or other uncorrectable medical problems. He is agreeable to proceed.   Deitra Mayo [facility] [place]: [phone]

## 2015-05-23 DIAGNOSIS — I63132 Cerebral infarction due to embolism of left carotid artery: Secondary | ICD-10-CM | POA: Diagnosis not present

## 2015-05-23 DIAGNOSIS — I2510 Atherosclerotic heart disease of native coronary artery without angina pectoris: Secondary | ICD-10-CM

## 2015-05-23 DIAGNOSIS — I482 Chronic atrial fibrillation: Secondary | ICD-10-CM | POA: Diagnosis not present

## 2015-05-23 DIAGNOSIS — I6529 Occlusion and stenosis of unspecified carotid artery: Secondary | ICD-10-CM | POA: Insufficient documentation

## 2015-05-23 DIAGNOSIS — E785 Hyperlipidemia, unspecified: Secondary | ICD-10-CM | POA: Insufficient documentation

## 2015-05-23 DIAGNOSIS — I63412 Cerebral infarction due to embolism of left middle cerebral artery: Secondary | ICD-10-CM | POA: Diagnosis not present

## 2015-05-23 DIAGNOSIS — D62 Acute posthemorrhagic anemia: Secondary | ICD-10-CM | POA: Diagnosis not present

## 2015-05-23 DIAGNOSIS — Z951 Presence of aortocoronary bypass graft: Secondary | ICD-10-CM | POA: Diagnosis not present

## 2015-05-23 LAB — BASIC METABOLIC PANEL
Anion gap: 12 (ref 5–15)
BUN: 24 mg/dL — ABNORMAL HIGH (ref 6–20)
CALCIUM: 9.1 mg/dL (ref 8.9–10.3)
CHLORIDE: 106 mmol/L (ref 101–111)
CO2: 21 mmol/L — AB (ref 22–32)
CREATININE: 1.17 mg/dL (ref 0.61–1.24)
GFR calc non Af Amer: 56 mL/min — ABNORMAL LOW (ref >60)
GLUCOSE: 106 mg/dL — AB (ref 65–99)
Potassium: 3.9 mmol/L (ref 3.5–5.1)
Sodium: 139 mmol/L (ref 135–145)

## 2015-05-23 LAB — CBC
HCT: 44.6 % (ref 39.0–52.0)
HEMOGLOBIN: 14.4 g/dL (ref 13.0–17.0)
MCH: 28.6 pg (ref 26.0–34.0)
MCHC: 32.3 g/dL (ref 30.0–36.0)
MCV: 88.5 fL (ref 78.0–100.0)
PLATELETS: 150 10*3/uL (ref 150–400)
RBC: 5.04 MIL/uL (ref 4.22–5.81)
RDW: 13.9 % (ref 11.5–15.5)
WBC: 11.1 10*3/uL — ABNORMAL HIGH (ref 4.0–10.5)

## 2015-05-23 LAB — URINALYSIS, ROUTINE W REFLEX MICROSCOPIC
Bilirubin Urine: NEGATIVE
Glucose, UA: NEGATIVE mg/dL
Hgb urine dipstick: NEGATIVE
KETONES UR: NEGATIVE mg/dL
LEUKOCYTES UA: NEGATIVE
NITRITE: NEGATIVE
PH: 6 (ref 5.0–8.0)
Protein, ur: NEGATIVE mg/dL
SPECIFIC GRAVITY, URINE: 1.013 (ref 1.005–1.030)

## 2015-05-23 LAB — LIPID PANEL
CHOLESTEROL: 132 mg/dL (ref 0–200)
HDL: 46 mg/dL (ref >40)
LDL Cholesterol: 74 mg/dL (ref 0–99)
TRIGLYCERIDES: 60 mg/dL (ref <150)
Total CHOL/HDL Ratio: 2.9 RATIO
VLDL: 12 mg/dL (ref 0–40)

## 2015-05-23 LAB — SURGICAL PCR SCREEN
MRSA, PCR: NEGATIVE
Staphylococcus aureus: NEGATIVE

## 2015-05-23 MED ORDER — CARVEDILOL 12.5 MG PO TABS
12.5000 mg | ORAL_TABLET | Freq: Two times a day (BID) | ORAL | Status: DC
  Administered 2015-05-23: 12.5 mg via ORAL
  Filled 2015-05-23 (×2): qty 1

## 2015-05-23 MED ORDER — DOCUSATE SODIUM 100 MG PO CAPS: 100.0000 mg | ORAL_CAPSULE | Freq: Every day | ORAL | Status: DC | PRN

## 2015-05-23 MED ORDER — ENSURE ENLIVE PO LIQD
237.0000 mL | Freq: Two times a day (BID) | ORAL | Status: DC
  Administered 2015-05-23 – 2015-05-25 (×3): 237 mL via ORAL

## 2015-05-23 NOTE — Progress Notes (Signed)
STROKE TEAM PROGRESS NOTE   SUBJECTIVE (INTERVAL HISTORY) No family is at the bedside.  Overall he feels his condition is completely resolved. He is scheduled for left CEA in am. He admitted that he had mild hemoptysis while on Xarelto. But on eliquis he has had no bleeding. He does have falls, for the last 3-6 months he had falls more than 5 but less than 10. No bleeding. He is ok with continue eliquis for afib in the near future.   OBJECTIVE Temp:  [97.3 F (36.3 C)-97.8 F (36.6 C)] 97.3 F (36.3 C) (04/12 1255) Pulse Rate:  [50-98] 98 (04/12 1255) Cardiac Rhythm:  [-] Atrial fibrillation (04/12 1015) Resp:  [14-21] 18 (04/12 1255) BP: (124-151)/(8-88) 129/49 mmHg (04/12 1255) SpO2:  [96 %-100 %] 100 % (04/12 1255) Weight:  [138 lb (62.596 kg)] 138 lb (62.596 kg) (04/12 0444)  No results for input(s): GLUCAP in the last 168 hours.  Recent Labs Lab 05/22/15 2026 05/23/15 0449  NA  --  139  K  --  3.9  CL  --  106  CO2  --  21*  GLUCOSE  --  106*  BUN  --  24*  CREATININE 1.32* 1.17  CALCIUM  --  9.1   No results for input(s): AST, ALT, ALKPHOS, BILITOT, PROT, ALBUMIN in the last 168 hours.  Recent Labs Lab 05/22/15 2026 05/23/15 0449  WBC 10.7* 11.1*  HGB 14.8 14.4  HCT 46.1 44.6  MCV 89.9 88.5  PLT 168 150   No results for input(s): CKTOTAL, CKMB, CKMBINDEX, TROPONINI in the last 168 hours. No results for input(s): LABPROT, INR in the last 72 hours.  Recent Labs  05/22/15 2201  COLORURINE YELLOW  LABSPEC 1.038*  PHURINE 6.0  GLUCOSEU NEGATIVE  HGBUR NEGATIVE  BILIRUBINUR NEGATIVE  KETONESUR 15*  PROTEINUR NEGATIVE  NITRITE NEGATIVE  LEUKOCYTESUR NEGATIVE       Component Value Date/Time   CHOL 132 05/23/2015 0449   TRIG 60 05/23/2015 0449   HDL 46 05/23/2015 0449   CHOLHDL 2.9 05/23/2015 0449   VLDL 12 05/23/2015 0449   LDLCALC 74 05/23/2015 0449   No results found for: HGBA1C No results found for: LABOPIA, COCAINSCRNUR, LABBENZ, AMPHETMU,  THCU, LABBARB  No results for input(s): ETH in the last 168 hours.  I was not able to reviewed the radiological images below but obtained from documentation.  MRI: MRI at [facility] showed small infarcts in the subcortical white matter on the left.  CT HEAD: His CT of the head on 05/18/15 showed no acute stroke.  CAROTID DUPLEX: His carotid duplex at [facility] on 05/19/2015 showed a 70-99% left carotid stenosis.  CEREBRAL ANGIOGRAM:  1.Approx 85 % stenosis of LT ICA prox. 2.Approx 90 % stenosis RT ICA cavernous seg with post stenotic dilatation  EKG  - afib  PHYSICAL EXAM  Temp:  [97.3 F (36.3 C)-97.8 F (36.6 C)] 97.3 F (36.3 C) (04/12 1255) Pulse Rate:  [50-98] 98 (04/12 1255) Resp:  [14-21] 18 (04/12 1255) BP: (124-151)/(8-88) 129/49 mmHg (04/12 1255) SpO2:  [96 %-100 %] 100 % (04/12 1255) Weight:  [138 lb (62.596 kg)] 138 lb (62.596 kg) (04/12 0444)  General - Well nourished, well developed, in no apparent distress.  Ophthalmologic - fundi not visualized due to eye movement.  Cardiovascular - irregularly irregular heart rate and rhythm.  Mental Status -  Level of arousal and orientation to time, place, and person were intact. Language including expression, naming, repetition, comprehension was assessed  and found intact. Fund of Knowledge was assessed and was intact.  Cranial Nerves II - XII - II - Visual field intact OU. III, IV, VI - Extraocular movements intact. V - Facial sensation intact bilaterally. VII - Facial movement intact bilaterally. VIII - Hearing & vestibular intact bilaterally. X - Palate elevates symmetrically. XI - Chin turning & shoulder shrug intact bilaterally. XII - Tongue protrusion intact.  Motor Strength - The patient's strength was normal in all extremities and pronator drift was absent.  Bulk was normal and fasciculations were absent.   Motor Tone - Muscle tone was assessed at the neck and appendages and was normal.  Reflexes -  The patient's reflexes were symmetrical in all extremities and he had no pathological reflexes.  Sensory - Light touch, temperature/pinprick were assessed and were symmetrical.    Coordination - The patient had normal movements in the hands and feet with no ataxia or dysmetria.  Tremor was absent.  Gait and Station - deferred due to safety concerns.   ASSESSMENT/PLAN Mr. Dave Harris is a 82 y.o. male with history of atrial fibrillation, CAD, HLD, HTN, MI, CVA transferred from [facility] for left brain stroke and left ICA stenosis. Symptoms resolved.    Stroke:  Left subcortical infarcts likely due to left ICA high grade stenosis with artery to artery embolism.  MRI  Left subcortical WM infarcts  Cerebral angio - left ICA proximal 85% stenosis, right ICA cavernous 90% stenosis  Carotid Doppler  Left ICA 70-99% stenosis  2D Echo 06/16/2012 with EF of 55-60% and 07/2014 EF 60-70 %   LDL 74  HgbA1c pending  Heparin subq for VTE prophylaxis  Diet Heart Room service appropriate?: Yes; Fluid consistency:: Thin   aspirin 325 mg daily prior to admission, now on aspirin 325 mg daily  Patient counseled to be compliant with his antithrombotic medications  Ongoing aggressive stroke risk factor management  Therapy recommendations:  Pending   Disposition:  Pending   Carotid stenosis, left  Cerebral angio confirmed left ICA 85% stenosis  VVS consulted  Plan for CEA in am  Currently on ASA 325, recommend post CEA if hemodynamically stable to start eliquis 2.5mg  bid along with ASA 81mg .   Afib and CAD s/p CABG  Was on Xarelto but d/c due to mild hemoptysis  Was then on eliquis 2.5mg  bid but d/c due to "frequent falls"  However, as per pt, the falls were not significant. No injury  CHADSVASCs score of at least 6 (age, HTN, stroke, vascular disease)  Due to high CHADS2-VASc score and hx of CAD s/p CABG, will recommend DOAC along with ASA 81mg  once post CEA and  hemodynamically stable  Prefer DOAC with eliquis. As per dosing instruction, he should be at eliquis 5mg  bid, however, due to his borderline weight and fluctuating Cre and fall hx and concurrent use of ASA, will recommend 2.5mg  bid.   Hypertension  Home meds:   coreg  Stable  BP goal 130-150 before CEA, but normotensive after CEA  Hyperlipidemia  Home meds:  lipitor 40   Currently on home meds  LDL 74, goal < 70  Continue statin at discharge  Other Stroke Risk Factors  Advanced age  Hx stroke/TIA  Coronary artery disease  Other Active Problems    Other Pertinent History  Mild leukocytosis  Hospital day # 1  Neurology will sign off. Please call with questions. Pt will follow up with Dr. Erlinda Hong at [facility] in about 2 months. Thanks for  the consult.   Rosalin Hawking, MD PhD Stroke Neurology 05/23/2015 1:46 PM    To contact Stroke Continuity provider, please refer to [email or web address] After hours, contact General Neurology

## 2015-05-23 NOTE — Clinical Social Work Note (Signed)
Clinical Social Work Assessment  Patient Details  Name: Dave Harris MRN: [id number] Date of Birth: 1929/12/31  Date of referral:  05/23/15               Reason for consult:  Discharge Planning, Facility Placement                Permission sought to share information with:  Family Supports, Chartered certified accountant granted to share information::  Yes, Verbal Permission Granted  Name::     Dave Harris and Dave Harris::  [employer]  Relationship::     Contact Information:     Housing/Transportation Living arrangements for the past 2 months:  [place] of Information:  Patient Patient Interpreter Needed:  None Criminal Activity/Legal Involvement Pertinent to Current Situation/Hospitalization:  No - Comment as needed Significant Relationships:  Adult Children Lives with:  Facility Resident Do you feel safe going back to the place where you live?  Yes Need for family participation in patient care:  Yes (Comment)  Care giving concerns:  The patient does not report any care giving concerns at this time. He plans to return to his ALF at discharge and reports that he is happy with the care he receives there.   Social Worker assessment / plan:  CSW met with patient at bedside to complete assessment. The patient reports that he was admitted from [place]. He has been a resident of the facility for 3 months and plans to return to the facility at discharge. The patient states that he has two daughters and requests that they be involved with his discharge plan when he is ready to transition back to the facility. CSW will provide updates to ALF as needed.   Employment status:  Retired Forensic scientist:  Managed Care PT Recommendations:  No Follow Up Information / Referral to community resources:  Other (Comment Required) (Information will be sent to [facility].)  Patient/Family's Response to  care:  The patient and family appear to be happy with the care the patient is receiving and the patient expresses appreciation for CSW involvement.   Patient/Family's Understanding of and Emotional Response to Diagnosis, Current Treatment, and Prognosis:   The patient shares that he is scheduled to have surgery tomorrow, so he appears to have good insight into his care plan. The patient and family have a good understanding of the reason for the patient's admission and his post DC needs.    Emotional Assessment Appearance:  Appears stated age Attitude/Demeanor/Rapport:  Other (Patient was appropriate and welcoming of CSW.) Affect (typically observed):  Accepting, Appropriate, Calm, Pleasant Orientation:  Oriented to Self, Oriented to Place, Oriented to  Time, Oriented to Situation Alcohol / Substance use:  Not Applicable Psych involvement (Current and /or in the community):  No (Comment)  Discharge Needs  Concerns to be addressed:  Discharge Planning Concerns Readmission within the last 30 days:  No Current discharge risk:  Chronically ill Barriers to Discharge:  Continued Medical Work up   Dave Noel, LCSW 05/23/2015, 2:42 PM

## 2015-05-23 NOTE — Progress Notes (Signed)
T RH Progress Note                                            Patient Demographics:    Dave Harris, is a 82 y.o. male, DOB - 1929-05-19, [id number]  Admit date - 05/22/2015   Admitting Physician Thurnell Lose, MD  Outpatient Primary MD for the patient is [facility] Angelique Blonder., MD  No chief complaint on file.     Dave Harris is a 82 y.o. male, History of CAD status post CABG over 10 years ago, chronic atrial fibrillation, multiple CVAs, history of carotid artery stenosis, essential hypertension, dyslipidemia, who has had multiple strokes in the past but recently had another stroke about 4-5 days ago, h/o 5-6 small strokes in the recent past. At [facility] he underwent ECHO which was unremarkable, CT angiogram showing bilateral carotid stenosis, he also had evidence of left embolic CVA on MRI at [facility], his case was discussed with interventional radiologist Dr. Estanislado Pandy who accepted the patient in transfer into the outpatient IR department. In the IR department he underwent carotid angiogram confirming bilateral carotid artery disease with at least 80-90% stenosis on the left side which is the same side of his recent stroke, and then admitted to Floor VVS consulted plan for L CEA pending cardiac clearance on Thursday   Subjective:  " i dont feel so good" unable to tell me anything specific", denies dyspnea/chest pain   Assessment  & Plan :   1. Recent left embolic CVA likely due to left 80-90% carotid artery stenosis -h/o numerous strokes per pt, no obvious deficits at this time -workup completed at [facility] few days ago, will request records -reportedly Echo was unremarkable with no embolic source EF [id number], LDL was 55,  -Neuro consulting-on ASA now, continue statin -VVS consulting, plan for L CEA tomorrow pending cardiac clearance- i  have asked Cards to see  2. CAD status post remote CABG in 1978 -stable, continue beta blocker, aspirin and statin -EKG without acute findings, will ask Cards for clearance, followed by Dr.Nishan  3. Chronic atrial fibrillation. [place] vasc 2 score >5.  -Continue Coreg, not on anticoagulation at this time, he stopped eliquis >1 month ago.  4. Dyslipidemia. Continue statin.   5. BPH. On Proscar which will be continued.  6. Bilateral carotid disease with left-sided stenosis -as above  DVT Prophylaxis Heparin  Code status: DNR, Dr.Singh d/w pt on admission Family communication: none at bedside Dispo: Home pending above surgery   Lab Results  Component Value Date   PLT 150 05/23/2015    Anti-infectives    None        Objective:   Filed Vitals:   05/22/15 1813 05/22/15 1843 05/22/15 2041 05/23/15 0444  BP: 132/76 142/78 137/57 151/8  Pulse: 57 64 70 58  Temp:  97.7 F (36.5 C) 97.8 F (36.6 C) 97.5 F (36.4 C)  TempSrc:  Oral Oral Oral  Resp:  15 16 16   Weight:    62.596 kg (138 lb)  SpO2: 99% 99% 98% 96%    Wt Readings from Last 3 Encounters:  05/23/15 62.596 kg (138 lb)  11/20/14 70.308 kg (155 lb)  04/03/14 72.712 kg (160 lb 4.8 oz)     Intake/Output Summary (Last 24 hours) at 05/23/15 1151 Last data filed at 05/23/15 0450  Gross per 24 hour  Intake  0 ml  Output    325 ml  Net   -325 ml     Physical Exam  Awake Alert, Oriented X 3, very hard of hearing HEENT: PERRLA, Supple Neck,No JVD Lungs: Symmetrical Chest wall movement, Good air movement bilaterally, CTAB CVS: RRR,No Gallops,Rubs or new Murmurs, No Parasternal Heave Abd: +ve B.Sounds, Abd Soft, No tenderness, No organomegaly appriciated,  Ext: No Cyanosis, Clubbing or edema, No new Rash or bruise  Neuro: non focal, no localising signs   Data Review:    CBC  Recent Labs Lab 05/22/15 2026 05/23/15 0449  WBC 10.7* 11.1*  HGB 14.8 14.4  HCT 46.1 44.6  PLT 168 150  MCV 89.9 88.5   MCH 28.8 28.6  MCHC 32.1 32.3  RDW 14.0 13.9    Chemistries   Recent Labs Lab 05/22/15 2026 05/23/15 0449  NA  --  139  K  --  3.9  CL  --  106  CO2  --  21*  GLUCOSE  --  106*  BUN  --  24*  CREATININE 1.32* 1.17  CALCIUM  --  9.1   ------------------------------------------------------------------------------------------------------------------  Recent Labs  05/23/15 0449  CHOL 132  HDL 46  LDLCALC 74  TRIG 60  CHOLHDL 2.9    No results found for: HGBA1C ------------------------------------------------------------------------------------------------------------------ No results for input(s): TSH, T4TOTAL, T3FREE, THYROIDAB in the last 72 hours.  Invalid input(s): FREET3 ------------------------------------------------------------------------------------------------------------------ No results for input(s): VITAMINB12, FOLATE, FERRITIN, TIBC, IRON, RETICCTPCT in the last 72 hours.  Coagulation profile No results for input(s): INR, PROTIME in the last 168 hours.  No results for input(s): DDIMER in the last 72 hours.  Cardiac Enzymes No results for input(s): CKMB, TROPONINI, MYOGLOBIN in the last 168 hours.  Invalid input(s): CK ------------------------------------------------------------------------------------------------------------------ No results found for: BNP  Inpatient Medications  Scheduled Meds: . aspirin  325 mg Oral Daily  . atorvastatin  40 mg Oral q1800  . carvedilol  12.5 mg Oral QHS  . docusate sodium  100 mg Oral Daily  . feeding supplement (ENSURE ENLIVE)  237 mL Oral BID BM  . finasteride  5 mg Oral Daily  . heparin  5,000 Units Subcutaneous 3 times per day  . loratadine  10 mg Oral Daily  . mirtazapine  15 mg Oral QHS  . multivitamin  1 tablet Oral q morning - 10a  . sodium chloride flush  3 mL Intravenous Q12H   Continuous Infusions: . sodium chloride 75 mL/hr at 05/23/15 1125   PRN Meds:.HYDROcodone-acetaminophen,  ondansetron **OR** ondansetron (ZOFRAN) IV  Micro Results No results found for this or any previous visit (from the past 240 hour(s)).  Radiology Reports Dg Chest Port 1 View  05/22/2015  CLINICAL DATA:  Shortness of Breath EXAM: PORTABLE CHEST 1 VIEW COMPARISON:  None. FINDINGS: Cardiac shadow is mildly enlarged. Postsurgical changes are seen. The lungs are well aerated bilaterally. No focal infiltrate or sizable effusion is seen. IMPRESSION: No active disease. Electronically Signed   By: Inez Catalina M.D.   On: 05/22/2015 19:51    Time Spent in minutes   56min   Tayloranne Lekas M.D on 05/23/2015 at 11:51 AM  Between 7am to 7pm - Pager - [phone]  After 7pm go to [email or web address] - password [facility]  [facility] -  Office  [phone]

## 2015-05-23 NOTE — Progress Notes (Signed)
Pt refused dinner but drank a carton of Resource breeze.  Will cont to offer supplements.

## 2015-05-23 NOTE — Progress Notes (Signed)
   VASCULAR SURGERY ASSESSMENT & PLAN:   Plan Left Carotid Endarterectomy tomorrow if cleared medically. Will leave the decision on need for cardiac clearance to Dr. Candiss Norse.   He is on ASA and is on a statin.  SUBJECTIVE: No new complaints  PHYSICAL EXAM: Filed Vitals:   05/22/15 1813 05/22/15 1843 05/22/15 2041 05/23/15 0444  BP: 132/76 142/78 137/57 151/8  Pulse: 57 64 70 58  Temp:  97.7 F (36.5 C) 97.8 F (36.6 C) 97.5 F (36.4 C)  TempSrc:  Oral Oral Oral  Resp:  15 16 16   Weight:    138 lb (62.596 kg)  SpO2: 99% 99% 98% 96%   Neuro intact  LABS: Lab Results  Component Value Date   WBC 11.1* 05/23/2015   HGB 14.4 05/23/2015   HCT 44.6 05/23/2015   MCV 88.5 05/23/2015   PLT 150 05/23/2015   Lab Results  Component Value Date   CREATININE 1.17 05/23/2015    Active Problems:   Chronic atrial fibrillation (HCC)   Myocardial infarction [facility])   Allergy   Stroke ([place])   CAD (coronary artery disease)   CVA (cerebral infarction)  Gae Gallop Beeper: [id number] 05/23/2015

## 2015-05-23 NOTE — Anesthesia Preprocedure Evaluation (Addendum)
Anesthesia Evaluation  Patient identified by MRN, date of birth, ID band Patient awake    Reviewed: Allergy & Precautions, NPO status , Patient's Chart, lab work & pertinent test results  History of Anesthesia Complications (+) PROLONGED EMERGENCE and history of anesthetic complications (occasionaly hard to awaken)  Airway Mallampati: III   Neck ROM: Full    Dental  (+) Edentulous Upper, Edentulous Lower   Pulmonary former smoker (quit 1995),    breath sounds clear to auscultation       Cardiovascular hypertension, Pt. on medications + CAD, + Past MI and + CABG (10 years agho)  + dysrhythmias Atrial Fibrillation  Rhythm:Irregular  ECHO 2014 EF 65%, normal wall motion   Neuro/Psych CVA on Left 5 days ago.  Angio 85% Left ICA stenosis and 90% right ICAt Stenosis CVA negative psych ROS   GI/Hepatic negative GI ROS, Neg liver ROS,   Endo/Other  negative endocrine ROS  Renal/GU negative Renal ROS  negative genitourinary   Musculoskeletal negative musculoskeletal ROS (+)   Abdominal   Peds negative pediatric ROS (+)  Hematology negative hematology ROS (+) 14/44   Anesthesia Other Findings   Reproductive/Obstetrics negative OB ROS                          Anesthesia Physical Anesthesia Plan  ASA: III  Anesthesia Plan: General   Post-op Pain Management:    Induction: Intravenous  Airway Management Planned: Oral ETT  Additional Equipment: Arterial line  Intra-op Plan:   Post-operative Plan: Extubation in OR  Informed Consent: I have reviewed the patients History and Physical, chart, labs and discussed the procedure including the risks, benefits and alternatives for the proposed anesthesia with the patient or authorized representative who has indicated his/her understanding and acceptance.     Plan Discussed with:   Anesthesia Plan Comments: (Maintain normal to high ET CO2, careful  BP Maintenance, A line IV x 2)        Anesthesia Quick Evaluation

## 2015-05-23 NOTE — Care Management Note (Signed)
Case Management Note Marvetta Gibbons RN, BSN Unit 2W-Case Manager [phone]  Patient Details  Name: Dave Harris MRN: [id number] Date of Birth: [birth year]  Subjective/Objective:    Pt admitted with CVA/carotid stenosis- plan for OR for CEA- tomorrow  4/13            Action/Plan: PTA  Pt lived at [facility]-- [name] following for return to ALF  Expected Discharge Date:                  Expected Discharge Plan:  Assisted Living / Rest Home  In-House Referral:  Clinical Social Work  Discharge planning Services  CM Consult  Post Acute Care Choice:    Choice offered to:     DME Arranged:    DME Agency:     HH Arranged:    [place] Agency:     Status of Service:  In process, will continue to follow  Medicare Important Message Given:    Date Medicare IM Given:    Medicare IM give by:    Date Additional Medicare IM Given:    Additional Medicare Important Message give by:     If discussed at [place] of Stay Meetings, dates discussed:    Additional Comments:  Dawayne Patricia, RN 05/23/2015, 3:09 PM

## 2015-05-23 NOTE — Progress Notes (Signed)
Utilization review completed.  

## 2015-05-23 NOTE — Consult Note (Signed)
CARDIOLOGY CONSULT NOTE   Patient ID: Dave Harris MRN: [id number] DOB/AGE: 07-12-1929 82 y.o.  Admit date: 05/22/2015  Primary Physician   [facility] Angelique Blonder., MD Primary Cardiologist  Previously Dr. Johnsie Cancel (last seen 07/2012). Currently Dr. Salley Scarlet Reason for Consultation   Preop clearance Requesting Physician  Dr. Broadus John  HPI: Dave Harris is a 82 y.o. male with a history of CAD s/p distant hx of CABG, Afib/aflutter currently not on any anticoagulation, multiple stroke, HL, HTN, carotid artery disease who transferred to [facility] for further management of acute stroke.   The patient is unable to provide details history. Last seen by Dr. Johnsie Cancel 07/30/2012. Per note, hx of hemoptysis or coffe grounds after 5 xarelto doses. Prior smoking. Echo 06/16/2012 with EF of 55-60%, no wm abnormality. Duplex carotids [id number] LICA. No anginal pain.   The patient is followed by cardiologist at [facility] afterwards. Per note of Dr. Alverda Skeans 03/19/2015 --> Discontinued Eliquis 2.5 mg twice a day due  to unstable gait and started on aspirin 324 mg.   History of non-STEMI 07/21/2014--> Follow-up cardiac catheterization showed all patent graft. Treated medically.   ANGIOGRAM/CORONARY ARTERIOGRAM:   There is occlussive disease in the left main. There is a new occluded distal posterior branch of right coronary artery,   Patent LIMA to LAD; Patent SVG to OM, with moderate disease in the circumflex, unchanged vs last cath  LEFT VENTRICULOGRAM:   normal left ventricular wall motion and systolic function with an   estimated ejection fraction of 60-70 %   The patient is presented to [facility] 05/18/2015 Following the syncopal episode and slurred speech. CT angiogram at [facility] showed bilateral carotid stenoses and evidence of an embolic stroke to the left brain on MRI. He was transported to [facility]  for a cerebral arteriogram by Dr. Estanislado Pandy.  In the IR department he underwent  carotid angiogram confirming bilateral carotid artery disease with at least 80-90% stenosis on the left side which is the same side of his recent stroke. Patient was seen by Dr. Scot Dock and plan to proceed with left carotid endarterectomy tomorrow and cardiology is consulted for preop clearance.  Echocardiogram done at [facility] 05/19/15 showed left ventricular function of 55-60%, mild concentric left ventricular hypertrophy, mild enlarge right ventricle, mild enlarged right atrium, mild mitral regurgitation, mild-to-moderate tricuspid regurgitation, no pericardial effusion.   Past Medical History  Diagnosis Date  . Hyperlipidemia   . Heart disease   . Chronic atrial fibrillation ([place])   . CAD (coronary artery disease)   . Hypertension   . Myocardial infarction ([place])   . History of stomach ulcers "sever"  . Complication of anesthesia     "sometimes I'm hard to wake up (05/23/2015)  . CHF (congestive heart failure) ([place])   . History of blood transfusion     "w/my heart OR"  . Stroke [facility]) several    "6 since June 2016; last one was 05/19/2015"; denies residual  (05/23/2015)  . Depression      Past Surgical History  Procedure Laterality Date  . Back surgery    . Carpal tunnel release Bilateral   . Transurethral resection of prostate  X 3    "never removed it" (05/22/2015)  . Colon surgery    . Lumbar disc surgery      "had 4 ruptured discs"  . Cardiac catheterization    . Cataract extraction w/ intraocular lens  implant, bilateral Bilateral   . Stomach surgery      "  had 1/2 my stomach removed; had several stomach ulcers"  . Multiple tooth extractions    . Tonsillectomy and adenoidectomy  ~ 1947  . Coronary artery bypass graft  1978    CABG X"?3"    Allergies  Allergen Reactions  . Plavix [Clopidogrel Bisulfate]   . Xarelto [Rivaroxaban] Other (See Comments)    bleeding    I have reviewed the patient's current medications . aspirin  325 mg Oral Daily  . atorvastatin   40 mg Oral q1800  . carvedilol  12.5 mg Oral QHS  . docusate sodium  100 mg Oral Daily  . feeding supplement (ENSURE ENLIVE)  237 mL Oral BID BM  . finasteride  5 mg Oral Daily  . heparin  5,000 Units Subcutaneous 3 times per day  . loratadine  10 mg Oral Daily  . mirtazapine  15 mg Oral QHS  . multivitamin  1 tablet Oral q morning - 10a  . sodium chloride flush  3 mL Intravenous Q12H   . sodium chloride 75 mL/hr at 05/22/15 2015   HYDROcodone-acetaminophen, ondansetron **OR** ondansetron (ZOFRAN) IV  Prior to Admission medications   Medication Sig Start Date End Date Taking? Authorizing Provider  aspirin 325 MG tablet Take 81 mg by mouth daily.    Yes Historical Provider, MD  atorvastatin (LIPITOR) 40 MG tablet Take 40 mg by mouth daily.   Yes Historical Provider, MD  carvedilol (COREG) 3.125 MG tablet Take 12.5 mg by mouth at bedtime.    Yes Historical Provider, MD  fexofenadine (ALLEGRA) 180 MG tablet Take 180 mg by mouth daily.   Yes Historical Provider, MD  finasteride (PROSCAR) 5 MG tablet Take 5 mg by mouth daily.   Yes Historical Provider, MD  levofloxacin (LEVAQUIN) 750 MG tablet Inject 750 mg into the vein daily.   Yes Historical Provider, MD  mirtazapine (REMERON) 15 MG tablet Take 15 mg by mouth at bedtime.   Yes Historical Provider, MD  Multiple Vitamins-Minerals (PRESERVISION AREDS PO) Take 1 tablet by mouth every morning.   Yes Historical Provider, MD  apixaban (ELIQUIS) 2.5 MG TABS tablet Take 2.5 mg by mouth 2 (two) times daily. Reported on 05/22/2015    Historical Provider, MD  docusate sodium (COLACE) 100 MG capsule Take 100 mg by mouth 1 day or 1 dose. Reported on 05/22/2015    Historical Provider, MD  guaifenesin (HUMIBID E) 400 MG TABS tablet Take 400 mg by mouth every 4 (four) hours. Reported on 05/22/2015    Historical Provider, MD     Social History   Social History  . Marital Status: Widowed    Spouse Name: N/A  . Number of Children: N/A  . Years of  Education: N/A   Occupational History  . Not on file.   Social History Main Topics  . Smoking status: Former Smoker -- 2.00 packs/day for 30 years    Types: Cigarettes  . Smokeless tobacco: Never Used     Comment: "quit smoking cigarettes in 1978 when I had my heart surgery"  . Alcohol Use: No  . Drug Use: No  . Sexual Activity: No   Other Topics Concern  . Not on file   Social History Narrative    No family status information on file.   Family History  Problem Relation Age of Onset  . CAD    . Heart disease Brother     before age 57  . Heart attack Brother        ROS:  Full 14 point review of systems complete and found to be negative unless listed above.  Physical Exam: Blood pressure 151/8, pulse 58, temperature 97.5 F (36.4 C), temperature source Oral, resp. rate 16, weight 138 lb (62.596 kg), SpO2 96 %.  General: elderly frail  male in no acute distress Head: Eyes PERRLA, No xanthomas. Normocephalic and atraumatic, oropharynx without edema or exudate.  Lungs: Resp regular and unlabored, CTA. Heart: RRR no s3, s4, or murmurs..   Neck: No carotid bruits. No lymphadenopathy.  No JVD. Abdomen: Bowel sounds present, abdomen soft and non-tender without masses or hernias noted. Msk:  No spine or cva tenderness. No weakness, no joint deformities or effusions. Extremities: No clubbing, cyanosis or edema. DP/PT/Radials 2+ and equal bilaterally. Neuro: Alert and oriented X 3. No focal deficits noted. Psych:  Good affect, responds appropriately Skin: No rashes or lesions noted.  Labs:   Lab Results  Component Value Date   WBC 11.1* 05/23/2015   HGB 14.4 05/23/2015   HCT 44.6 05/23/2015   MCV 88.5 05/23/2015   PLT 150 05/23/2015   No results for input(s): INR in the last 72 hours.  Recent Labs Lab 05/23/15 0449  NA 139  K 3.9  CL 106  CO2 21*  BUN 24*  CREATININE 1.17  CALCIUM 9.1  GLUCOSE 106*   No results found for: MG No results for input(s):  CKTOTAL, CKMB, TROPONINI in the last 72 hours. No results for input(s): TROPIPOC in the last 72 hours. No results found for: PROBNP Lab Results  Component Value Date   CHOL 132 05/23/2015   HDL 46 05/23/2015   LDLCALC 74 05/23/2015   TRIG 60 05/23/2015    Preliminary Cath & PCI Report 07/21/2014, 1:26 PM  Performing Cardiologist:STEVEN C ROHRBECK, MD, [facility]   PROCEDURE STATUS: Emergent  ACCESS SITE: left radial artery   INDICATIONS: NSTEMI within the last 7 days.  PROCEDURE: Coronary angiography, Left heart cath & LV gram,   ANGIOGRAM/CORONARY ARTERIOGRAM:  There is occlussive disease in the left main There is a new occluded distal posterior branch of right coronary artery,  Patent LIMA to LAD Patent SVG to OM, with moderate disease in the circumflex, unchanged vs  last cath LEFT VENTRICULOGRAM:  normal left ventricular wall motion and systolic function with an  estimated ejection fraction of 60-70 %   PCI:  None  Complications: None  Conclusions: abnormal Coronary arteries as described. , normal Left  ventricular systolic function as described and There were no complications  RECOMMENDATION:  Medical therapy  Denton Brick, MD, [facility] 07/21/2014, 1:26 PM  Echo: Echocardiogram done at [facility] 05/19/15 showed left ventricular function of 55-60%, mild concentric left ventricular hypertrophy, mild enlarge right ventricle, mild enlarged right atrium, mild mitral regurgitation, mild-to-moderate tricuspid regurgitation, no pericardial effusion.  ECG:  EKG done today shows A. Fib at a rate of 57 bpm, T-wave inversion in inferior lead and lead V3 to V6. This EKG appears similar to initial presentation to [facility] for/12/12/09/2017.  When compared to EKG of 08/06/2012 A. Fib is new and new T-wave inversion in lead V3 and V4.  Radiology:  Dg Chest Port 1 View  05/22/2015  CLINICAL DATA:  Shortness of Breath EXAM: PORTABLE CHEST 1 VIEW COMPARISON:  None.  FINDINGS: Cardiac shadow is mildly enlarged. Postsurgical changes are seen. The lungs are well aerated bilaterally. No focal infiltrate or sizable effusion is seen. IMPRESSION: No active disease. Electronically Signed   By: Linus Mako.D.  On: 05/22/2015 19:51    ASSESSMENT AND PLAN:     1. Chronic atrial fibrillation (HCC) - CHADSVASCs score of at least 6 (age, HTN, stroke, vascular disease). Hx of hemoptysis on Xarelto. Per note of Dr. Alverda Skeans 03/19/2015 --> Discontinued Eliquis 2.5 mg twice a day due  to unstable gait and started on aspirin 324 mg.  - Rate in mild 50s. On coreg 12.5mg  BID.   2. CAD s/p CABG - Hx of NSTEMI 6/201--> Follow-up cardiac catheterization showed all patent graft. Treated medically.   ANGIOGRAM/CORONARY ARTERIOGRAM:   There is occlussive disease in the left main. There is a new occluded distal posterior branch of right coronary artery,   Patent LIMA to LAD; Patent SVG to OM, with moderate disease in the circumflex, unchanged vs last cath  LEFT VENTRICULOGRAM:   Normal left ventricular wall motion and systolic function with an  estimated ejection fraction of 60-70 %  - The patient denies chest pain. EKG done today shows A. Fib at a rate of 57 bpm, T-wave inversion in inferior lead and lead V3 to V6. This EKG appears similar to initial presentation to [facility] for/12/12/09/2017. When compared to EKG of 08/06/2012 A. Fib is new and new T-wave inversion in lead V3 and V4. - Continue ASA, statin and BB.   3. Acute Stroke [facility]) with hx of stroke - Per neurology.   4. Carotid artery diease - Plan for Left carotid endarterectomy tomorrow.  Dospi: given multiple medical problem and not on anticoagulation he will be at moderate to high risk for any surgery. Will review with MD.    SignedLeanor Kail, [place] 05/23/2015, 10:08 AM Pager [phone]  Co-Sign MD

## 2015-05-23 NOTE — Evaluation (Signed)
Physical Therapy Evaluation Patient Details Name: MILFORD STROEBEL MRN: [id number] DOB: [birth year] Today's Date: 05/23/2015   History of Present Illness  Dave Harris is a 82 y.o. male, History of CAD status post CABG over 10 years ago, chronic atrial fibrillation, multiple CVAs, history of carotid artery stenosis, essential hypertension, dyslipidemia, who has had multiple strokes in the past but recently had another stroke about 4-5 days ago, h/o 5-6 small strokes in the recent past. At [facility] he underwent ECHO which was unremarkable, CT angiogram showing bilateral carotid stenosis, he also had evidence of left embolic CVA on MRI at [place]. Had an angiogram on 05/22/15.   Clinical Impression  Pt admitted with above diagnosis and currently with functional limitations due to the deficits listed below (see PT Problem List).  Unable to fully assess mobility as pt refusing to attempt ambulation. Pt reports that he was living at an ALF previously and is planning to return to this upon D/C. Anticipate that this will be appropriate but will have to further assess the patient's mobility during subsequent sessions.  PT to continue to follow and progress mobility.     Follow Up Recommendations No PT follow up;Supervision - Intermittent (ALF, pt refusing to have any HHPT)    Equipment Recommendations  None recommended by PT    Recommendations for Other Services       Precautions / Restrictions Precautions Precautions: Fall Restrictions Weight Bearing Restrictions: No      Mobility  Bed Mobility               General bed mobility comments: up in chair upon arrival   Transfers Overall transfer level: Needs assistance Equipment used: None Transfers: Sit to/from Stand Sit to Stand: Min guard         General transfer comment: Encouragment needed for pt to attempt sit<>stand transfer. Pt able to perform without physical assistance but providing min guard for  safety. Pt attempting to stand before clearing area for safety. PT having to clear objects quickly for pt safety.   Ambulation/Gait             General Gait Details: pt refusing to ambulate, states that he is doing fine. Repeated attempts for pt to participate but pt continues to refuse.   Stairs            Wheelchair Mobility    Modified Rankin (Stroke Patients Only) Modified Rankin (Stroke Patients Only) Pre-Morbid Rankin Score: Moderate disability Modified Rankin: Moderately severe disability     Balance                                             Pertinent Vitals/Pain Pain Assessment: No/denies pain    Home Living Family/patient expects to be discharged to:: Assisted living               Home Equipment: Walker - 2 wheels Additional Comments: per pt report    Prior Function Level of Independence: Needs assistance   Gait / Transfers Assistance Needed: reports using rw for ambulation  ADL's / Homemaking Assistance Needed: states he was dressing himself but getting help with showering.   Comments: details provided per patient who appears to be a poor historian.      Hand Dominance        Extremity/Trunk Assessment   Upper Extremity Assessment: Overall WFL for tasks assessed  Lower Extremity Assessment:  (pt able to move independently, refusing any further testing.)         Communication   Communication: No difficulties  Cognition Arousal/Alertness: Awake/alert Behavior During Therapy: Agitated Overall Cognitive Status: No family/caregiver present to determine baseline cognitive functioning (poor safety awareness)                      General Comments General comments (skin integrity, edema, etc.): Pt able to stand static without UE support, pt refusing to participate in any other testing.     Exercises        Assessment/Plan    PT Assessment Patient needs continued PT services  PT Diagnosis  Difficulty walking   PT Problem List Decreased balance;Decreased mobility;Decreased safety awareness;Decreased knowledge of use of DME  PT Treatment Interventions DME instruction;Gait training;Functional mobility training;Therapeutic activities;Therapeutic exercise;Balance training;Neuromuscular re-education;Patient/family education   PT Goals (Current goals can be found in the Care Plan section) Acute Rehab PT Goals Patient Stated Goal: go home PT Goal Formulation: With patient Time For Goal Achievement: 06/06/15 Potential to Achieve Goals: Good    Frequency Min 3X/week   Barriers to discharge        Co-evaluation               End of Session Equipment Utilized During Treatment: Other (comment) (pt not waiting for gait belt to be applied despite cues. ) Activity Tolerance: Patient tolerated treatment well;Other (comment) (session limited by pt lack of participation) Patient left: in chair;with call bell/phone within reach Nurse Communication: Mobility status         Time: 1202-1216 PT Time Calculation (min) (ACUTE ONLY): 14 min   Charges:   PT Evaluation $PT Eval Moderate Complexity: 1 Procedure     PT G Codes:        Cassell Clement, PT, CSCS Pager [phone] Office [phone]  05/23/2015, 12:45 PM

## 2015-05-24 ENCOUNTER — Encounter (HOSPITAL_COMMUNITY)
Admission: AD | Disposition: A | Discharge status: Home Health | Payer: Self-pay | Source: Ambulatory Visit | Attending: Internal Medicine

## 2015-05-24 ENCOUNTER — Inpatient Hospital Stay (HOSPITAL_COMMUNITY): Payer: PPO | Admitting: Certified Registered Nurse Anesthetist

## 2015-05-24 ENCOUNTER — Telehealth: Payer: Self-pay | Admitting: Vascular Surgery

## 2015-05-24 DIAGNOSIS — Z951 Presence of aortocoronary bypass graft: Secondary | ICD-10-CM | POA: Diagnosis not present

## 2015-05-24 DIAGNOSIS — I6523 Occlusion and stenosis of bilateral carotid arteries: Secondary | ICD-10-CM | POA: Diagnosis not present

## 2015-05-24 DIAGNOSIS — I482 Chronic atrial fibrillation: Secondary | ICD-10-CM | POA: Diagnosis not present

## 2015-05-24 DIAGNOSIS — I2510 Atherosclerotic heart disease of native coronary artery without angina pectoris: Secondary | ICD-10-CM | POA: Diagnosis not present

## 2015-05-24 DIAGNOSIS — I63132 Cerebral infarction due to embolism of left carotid artery: Secondary | ICD-10-CM | POA: Diagnosis not present

## 2015-05-24 DIAGNOSIS — E785 Hyperlipidemia, unspecified: Secondary | ICD-10-CM

## 2015-05-24 DIAGNOSIS — I6522 Occlusion and stenosis of left carotid artery: Secondary | ICD-10-CM | POA: Diagnosis not present

## 2015-05-24 DIAGNOSIS — I63412 Cerebral infarction due to embolism of left middle cerebral artery: Secondary | ICD-10-CM | POA: Diagnosis not present

## 2015-05-24 DIAGNOSIS — I509 Heart failure, unspecified: Secondary | ICD-10-CM | POA: Diagnosis not present

## 2015-05-24 DIAGNOSIS — D62 Acute posthemorrhagic anemia: Secondary | ICD-10-CM | POA: Diagnosis not present

## 2015-05-24 DIAGNOSIS — I6529 Occlusion and stenosis of unspecified carotid artery: Secondary | ICD-10-CM | POA: Diagnosis not present

## 2015-05-24 HISTORY — PX: ENDARTERECTOMY: SHX5162

## 2015-05-24 LAB — CBC
HCT: 42.5 % (ref 39.0–52.0)
Hemoglobin: 13.6 g/dL (ref 13.0–17.0)
MCH: 28.3 pg (ref 26.0–34.0)
MCHC: 32 g/dL (ref 30.0–36.0)
MCV: 88.5 fL (ref 78.0–100.0)
PLATELETS: 148 10*3/uL — AB (ref 150–400)
RBC: 4.8 MIL/uL (ref 4.22–5.81)
RDW: 13.7 % (ref 11.5–15.5)
WBC: 10.1 10*3/uL (ref 4.0–10.5)

## 2015-05-24 LAB — URINE CULTURE

## 2015-05-24 LAB — POCT I-STAT 7, (LYTES, BLD GAS, ICA,H+H)
Acid-base deficit: 3 mmol/L — ABNORMAL HIGH (ref 0.0–2.0)
BICARBONATE: 24.2 meq/L — AB (ref 20.0–24.0)
Calcium, Ion: 1.26 mmol/L (ref 1.13–1.30)
HEMATOCRIT: 40 % (ref 39.0–52.0)
HEMOGLOBIN: 13.6 g/dL (ref 13.0–17.0)
O2 Saturation: 100 %
PH ART: 7.277 — AB (ref 7.350–7.450)
PO2 ART: 279 mmHg — AB (ref 80.0–100.0)
Patient temperature: 36.7
Potassium: 3.9 mmol/L (ref 3.5–5.1)
SODIUM: 140 mmol/L (ref 135–145)
TCO2: 26 mmol/L (ref 0–100)
pCO2 arterial: 51.7 mmHg — ABNORMAL HIGH (ref 35.0–45.0)

## 2015-05-24 LAB — BASIC METABOLIC PANEL
Anion gap: 11 (ref 5–15)
BUN: 20 mg/dL (ref 6–20)
CHLORIDE: 111 mmol/L (ref 101–111)
CO2: 20 mmol/L — ABNORMAL LOW (ref 22–32)
CREATININE: 1.04 mg/dL (ref 0.61–1.24)
Calcium: 8.8 mg/dL — ABNORMAL LOW (ref 8.9–10.3)
GFR calc Af Amer: >60 mL/min (ref >60)
GFR calc non Af Amer: >60 mL/min (ref >60)
Glucose, Bld: 102 mg/dL — ABNORMAL HIGH (ref 65–99)
Potassium: 3.9 mmol/L (ref 3.5–5.1)
SODIUM: 142 mmol/L (ref 135–145)

## 2015-05-24 LAB — HEMOGLOBIN A1C
Hgb A1c MFr Bld: 5.9 % — ABNORMAL HIGH (ref 4.8–5.6)
MEAN PLASMA GLUCOSE: 123 mg/dL

## 2015-05-24 SURGERY — ENDARTERECTOMY, CAROTID
Anesthesia: General | Site: Neck | Laterality: Left

## 2015-05-24 MED ORDER — ARTIFICIAL TEARS OP OINT
TOPICAL_OINTMENT | OPHTHALMIC | Status: AC
  Filled 2015-05-24: qty 3.5

## 2015-05-24 MED ORDER — DEXTROSE 5 % IV SOLN
1.5000 g | INTRAVENOUS | Status: AC
  Administered 2015-05-24: 1.5 g via INTRAVENOUS

## 2015-05-24 MED ORDER — PHENOL 1.4 % MT LIQD: 1.0000 | OROMUCOSAL | Status: DC | PRN

## 2015-05-24 MED ORDER — PHENYLEPHRINE HCL 10 MG/ML IJ SOLN
10.0000 mg | INTRAVENOUS | Status: DC | PRN
  Administered 2015-05-24: 20 ug/min via INTRAVENOUS

## 2015-05-24 MED ORDER — ASPIRIN 325 MG PO TABS
325.0000 mg | ORAL_TABLET | Freq: Every day | ORAL | Status: DC
  Administered 2015-05-25: 325 mg via ORAL
  Filled 2015-05-24: qty 1

## 2015-05-24 MED ORDER — PROPOFOL 10 MG/ML IV BOLUS
INTRAVENOUS | Status: AC
  Filled 2015-05-24: qty 20

## 2015-05-24 MED ORDER — DEXAMETHASONE SODIUM PHOSPHATE 4 MG/ML IJ SOLN
INTRAMUSCULAR | Status: AC
  Filled 2015-05-24: qty 1

## 2015-05-24 MED ORDER — DEXAMETHASONE SODIUM PHOSPHATE 4 MG/ML IJ SOLN
INTRAMUSCULAR | Status: DC | PRN
  Administered 2015-05-24: 4 mg via INTRAVENOUS

## 2015-05-24 MED ORDER — PROPOFOL 10 MG/ML IV BOLUS
INTRAVENOUS | Status: DC | PRN
  Administered 2015-05-24: 100 mg via INTRAVENOUS

## 2015-05-24 MED ORDER — LIDOCAINE-EPINEPHRINE (PF) 1 %-1:200000 IJ SOLN
INTRAMUSCULAR | Status: DC | PRN
  Administered 2015-05-24: 10 mL

## 2015-05-24 MED ORDER — POTASSIUM CHLORIDE CRYS ER 20 MEQ PO TBCR: 20.0000 meq | EXTENDED_RELEASE_TABLET | Freq: Every day | ORAL | Status: DC | PRN

## 2015-05-24 MED ORDER — LIDOCAINE HCL (PF) 1 % IJ SOLN
INTRAMUSCULAR | Status: AC
  Filled 2015-05-24: qty 30

## 2015-05-24 MED ORDER — LIDOCAINE-EPINEPHRINE (PF) 1 %-1:200000 IJ SOLN
INTRAMUSCULAR | Status: AC
  Filled 2015-05-24: qty 30

## 2015-05-24 MED ORDER — SUGAMMADEX SODIUM 200 MG/2ML IV SOLN
INTRAVENOUS | Status: AC
  Filled 2015-05-24: qty 2

## 2015-05-24 MED ORDER — ACETAMINOPHEN 650 MG RE SUPP: 325.0000 mg | RECTAL | Status: DC | PRN

## 2015-05-24 MED ORDER — BISACODYL 10 MG RE SUPP
10.0000 mg | Freq: Every day | RECTAL | Status: DC | PRN
Start: 2015-05-24 — End: 2015-05-26

## 2015-05-24 MED ORDER — DOCUSATE SODIUM 100 MG PO CAPS
100.0000 mg | ORAL_CAPSULE | Freq: Every day | ORAL | Status: DC
  Administered 2015-05-25 – 2015-05-26 (×2): 100 mg via ORAL
  Filled 2015-05-24 (×2): qty 1

## 2015-05-24 MED ORDER — CARVEDILOL 12.5 MG PO TABS
12.5000 mg | ORAL_TABLET | Freq: Two times a day (BID) | ORAL | Status: DC
  Administered 2015-05-24 – 2015-05-25 (×2): 12.5 mg via ORAL
  Filled 2015-05-24 (×2): qty 1

## 2015-05-24 MED ORDER — HYDRALAZINE HCL 20 MG/ML IJ SOLN: 5.0000 mg | INTRAMUSCULAR | Status: DC | PRN

## 2015-05-24 MED ORDER — ROCURONIUM BROMIDE 50 MG/5ML IV SOLN
INTRAVENOUS | Status: AC
  Filled 2015-05-24: qty 1

## 2015-05-24 MED ORDER — ONDANSETRON HCL 4 MG/2ML IJ SOLN: 4.0000 mg | Freq: Once | INTRAMUSCULAR | Status: DC

## 2015-05-24 MED ORDER — PHENYLEPHRINE 40 MCG/ML (10ML) SYRINGE FOR IV PUSH (FOR BLOOD PRESSURE SUPPORT)
PREFILLED_SYRINGE | INTRAVENOUS | Status: AC
  Filled 2015-05-24: qty 10

## 2015-05-24 MED ORDER — METOPROLOL TARTRATE 1 MG/ML IV SOLN: 2.0000 mg | INTRAVENOUS | Status: DC | PRN

## 2015-05-24 MED ORDER — LIDOCAINE HCL 4 % MT SOLN
OROMUCOSAL | Status: DC | PRN
  Administered 2015-05-24: 4 mL via TOPICAL

## 2015-05-24 MED ORDER — PHENYLEPHRINE HCL 10 MG/ML IJ SOLN
INTRAMUSCULAR | Status: DC | PRN
  Administered 2015-05-24 (×3): 80 ug via INTRAVENOUS

## 2015-05-24 MED ORDER — ACETAMINOPHEN 325 MG PO TABS
325.0000 mg | ORAL_TABLET | ORAL | Status: DC | PRN
  Administered 2015-05-25: 650 mg via ORAL
  Filled 2015-05-24: qty 2

## 2015-05-24 MED ORDER — SODIUM CHLORIDE 0.9 % IV SOLN
500.0000 mL | Freq: Once | INTRAVENOUS | Status: AC | PRN
  Administered 2015-05-24: 500 mL via INTRAVENOUS

## 2015-05-24 MED ORDER — GUAIFENESIN-DM 100-10 MG/5ML PO SYRP
15.0000 mL | ORAL_SOLUTION | ORAL | Status: DC | PRN
Start: 2015-05-24 — End: 2015-05-26

## 2015-05-24 MED ORDER — ESMOLOL HCL 100 MG/10ML IV SOLN
INTRAVENOUS | Status: AC
  Filled 2015-05-24: qty 10

## 2015-05-24 MED ORDER — LACTATED RINGERS IV SOLN
INTRAVENOUS | Status: DC
  Administered 2015-05-24 (×2): via INTRAVENOUS

## 2015-05-24 MED ORDER — MEPERIDINE HCL 25 MG/ML IJ SOLN: 6.2500 mg | INTRAMUSCULAR | Status: DC | PRN

## 2015-05-24 MED ORDER — LABETALOL HCL 5 MG/ML IV SOLN: 10.0000 mg | INTRAVENOUS | Status: DC | PRN

## 2015-05-24 MED ORDER — DEXTROSE 5 % IV SOLN
INTRAVENOUS | Status: AC
  Filled 2015-05-24: qty 1.5

## 2015-05-24 MED ORDER — DEXTROSE 5 % IV SOLN
1.5000 g | Freq: Two times a day (BID) | INTRAVENOUS | Status: AC
  Administered 2015-05-24 – 2015-05-25 (×2): 1.5 g via INTRAVENOUS
  Filled 2015-05-24 (×2): qty 1.5

## 2015-05-24 MED ORDER — MORPHINE SULFATE (PF) 2 MG/ML IV SOLN: 1.0000 mg | INTRAVENOUS | Status: DC | PRN

## 2015-05-24 MED ORDER — ONDANSETRON HCL 4 MG/2ML IJ SOLN
INTRAMUSCULAR | Status: AC
  Filled 2015-05-24: qty 2

## 2015-05-24 MED ORDER — ROCURONIUM BROMIDE 100 MG/10ML IV SOLN
INTRAVENOUS | Status: DC | PRN
  Administered 2015-05-24: 40 mg via INTRAVENOUS

## 2015-05-24 MED ORDER — STERILE WATER FOR INJECTION IJ SOLN
INTRAMUSCULAR | Status: AC
  Filled 2015-05-24: qty 20

## 2015-05-24 MED ORDER — SODIUM CHLORIDE 0.9 % IV SOLN
INTRAVENOUS | Status: DC | PRN
  Administered 2015-05-24: 500 mL

## 2015-05-24 MED ORDER — SUCCINYLCHOLINE CHLORIDE 20 MG/ML IJ SOLN
INTRAMUSCULAR | Status: AC
  Filled 2015-05-24: qty 1

## 2015-05-24 MED ORDER — LIDOCAINE HCL (CARDIAC) 20 MG/ML IV SOLN
INTRAVENOUS | Status: AC
  Filled 2015-05-24: qty 5

## 2015-05-24 MED ORDER — PROTAMINE SULFATE 10 MG/ML IV SOLN
INTRAVENOUS | Status: AC
  Filled 2015-05-24: qty 5

## 2015-05-24 MED ORDER — FENTANYL CITRATE (PF) 100 MCG/2ML IJ SOLN: 25.0000 ug | INTRAMUSCULAR | Status: DC | PRN

## 2015-05-24 MED ORDER — LIDOCAINE HCL (CARDIAC) 20 MG/ML IV SOLN
INTRAVENOUS | Status: DC | PRN
  Administered 2015-05-24: 100 mg via INTRAVENOUS

## 2015-05-24 MED ORDER — PANTOPRAZOLE SODIUM 40 MG PO TBEC
40.0000 mg | DELAYED_RELEASE_TABLET | Freq: Every day | ORAL | Status: DC
  Administered 2015-05-24 – 2015-05-26 (×3): 40 mg via ORAL
  Filled 2015-05-24 (×3): qty 1

## 2015-05-24 MED ORDER — FENTANYL CITRATE (PF) 100 MCG/2ML IJ SOLN
INTRAMUSCULAR | Status: DC | PRN
  Administered 2015-05-24: 75 ug via INTRAVENOUS
  Administered 2015-05-24: 25 ug via INTRAVENOUS

## 2015-05-24 MED ORDER — DEXTRAN 40 IN SALINE 10-0.9 % IV SOLN
INTRAVENOUS | Status: AC
  Filled 2015-05-24: qty 500

## 2015-05-24 MED ORDER — ALUM & MAG HYDROXIDE-SIMETH 200-200-20 MG/5ML PO SUSP: 15.0000 mL | ORAL | Status: DC | PRN

## 2015-05-24 MED ORDER — SODIUM CHLORIDE 0.9 % IV SOLN
0.0125 ug/kg/min | INTRAVENOUS | Status: AC
  Administered 2015-05-24: .05 ug/kg/min via INTRAVENOUS
  Filled 2015-05-24: qty 2000

## 2015-05-24 MED ORDER — HEPARIN SODIUM (PORCINE) 5000 UNIT/ML IJ SOLN
5000.0000 [IU] | Freq: Three times a day (TID) | INTRAMUSCULAR | Status: DC
  Administered 2015-05-25 (×3): 5000 [IU] via SUBCUTANEOUS
  Filled 2015-05-24 (×3): qty 1

## 2015-05-24 MED ORDER — FENTANYL CITRATE (PF) 250 MCG/5ML IJ SOLN
INTRAMUSCULAR | Status: AC
  Filled 2015-05-24: qty 5

## 2015-05-24 MED ORDER — DEXTRAN 40 IN SALINE 10-0.9 % IV SOLN
INTRAVENOUS | Status: DC | PRN
  Administered 2015-05-24: 500 mL

## 2015-05-24 MED ORDER — EPHEDRINE SULFATE 50 MG/ML IJ SOLN
INTRAMUSCULAR | Status: AC
  Filled 2015-05-24: qty 2

## 2015-05-24 MED ORDER — EPHEDRINE SULFATE 50 MG/ML IJ SOLN
INTRAMUSCULAR | Status: DC | PRN
  Administered 2015-05-24 (×2): 5 mg via INTRAVENOUS

## 2015-05-24 MED ORDER — PROTAMINE SULFATE 10 MG/ML IV SOLN
INTRAVENOUS | Status: DC | PRN
  Administered 2015-05-24: 25 mg via INTRAVENOUS
  Administered 2015-05-24: 5 mg via INTRAVENOUS

## 2015-05-24 MED ORDER — SUGAMMADEX SODIUM 200 MG/2ML IV SOLN
INTRAVENOUS | Status: DC | PRN
  Administered 2015-05-24: 130 mg via INTRAVENOUS

## 2015-05-24 MED ORDER — HEPARIN SODIUM (PORCINE) 1000 UNIT/ML IJ SOLN
INTRAMUSCULAR | Status: DC | PRN
  Administered 2015-05-24: 6000 [IU] via INTRAVENOUS

## 2015-05-24 SURGICAL SUPPLY — 40 items
BAG DECANTER FOR FLEXI CONT (MISCELLANEOUS) ×3 IMPLANT
CANISTER SUCTION 2500CC (MISCELLANEOUS) ×3 IMPLANT
CANNULA VESSEL 3MM 2 BLNT TIP (CANNULA) ×3 IMPLANT
CATH ROBINSON RED A/P 18FR (CATHETERS) ×3 IMPLANT
CLIP TI MEDIUM 24 (CLIP) ×3 IMPLANT
CLIP TI WIDE RED SMALL 24 (CLIP) ×3 IMPLANT
CRADLE DONUT ADULT HEAD (MISCELLANEOUS) ×3 IMPLANT
DRAIN CHANNEL 15F RND FF W/TCR (WOUND CARE) IMPLANT
ELECT REM PT RETURN 9FT ADLT (ELECTROSURGICAL) ×3
ELECTRODE REM PT RTRN 9FT ADLT (ELECTROSURGICAL) ×1 IMPLANT
EVACUATOR SILICONE 100CC (DRAIN) IMPLANT
GLOVE BIO SURGEON STRL SZ 6.5 (GLOVE) ×6 IMPLANT
GLOVE BIO SURGEON STRL SZ7.5 (GLOVE) ×3 IMPLANT
GLOVE BIO SURGEONS STRL SZ 6.5 (GLOVE) ×6
GLOVE BIOGEL PI IND STRL 6.5 (GLOVE) IMPLANT
GLOVE BIOGEL PI IND STRL 8 (GLOVE) ×1 IMPLANT
GLOVE BIOGEL PI INDICATOR 6.5 (GLOVE) ×8
GLOVE BIOGEL PI INDICATOR 8 (GLOVE) ×2
GOWN STRL REUS W/ TWL LRG LVL3 (GOWN DISPOSABLE) ×3 IMPLANT
GOWN STRL REUS W/TWL LRG LVL3 (GOWN DISPOSABLE) ×18
KIT BASIN OR (CUSTOM PROCEDURE TRAY) ×3 IMPLANT
KIT ROOM TURNOVER OR (KITS) ×3 IMPLANT
LIQUID BAND (GAUZE/BANDAGES/DRESSINGS) ×3 IMPLANT
NDL HYPO 25X1 1.5 SAFETY (NEEDLE) ×1 IMPLANT
NEEDLE HYPO 25X1 1.5 SAFETY (NEEDLE) ×3 IMPLANT
NS IRRIG 1000ML POUR BTL (IV SOLUTION) ×6 IMPLANT
PACK CAROTID (CUSTOM PROCEDURE TRAY) ×3 IMPLANT
PAD ARMBOARD 7.5X6 YLW CONV (MISCELLANEOUS) ×6 IMPLANT
SHUNT CAROTID BYPASS 10 (VASCULAR PRODUCTS) IMPLANT
SHUNT CAROTID BYPASS 12FRX15.5 (VASCULAR PRODUCTS) ×2 IMPLANT
SPONGE INTESTINAL PEANUT (DISPOSABLE) ×3 IMPLANT
SPONGE SURGIFOAM ABS GEL 100 (HEMOSTASIS) IMPLANT
SUT PROLENE 6 0 BV (SUTURE) ×5 IMPLANT
SUT PROLENE 7 0 BV 1 (SUTURE) IMPLANT
SUT SILK 2 0 FS (SUTURE) IMPLANT
SUT VIC AB 3-0 SH 27 (SUTURE) ×3
SUT VIC AB 3-0 SH 27X BRD (SUTURE) ×1 IMPLANT
SUT VICRYL 4-0 PS2 18IN ABS (SUTURE) ×3 IMPLANT
SYR CONTROL 10ML LL (SYRINGE) ×3 IMPLANT
WATER STERILE IRR 1000ML POUR (IV SOLUTION) ×3 IMPLANT

## 2015-05-24 NOTE — Progress Notes (Signed)
PT Cancellation Note  Patient Details Name: [name] MRN: [id number] DOB: 1929/10/30   Cancelled Treatment:    Reason Eval/Treat Not Completed: Patient at surgery, will continue PT services as appropriate.    Cassell Clement, PT, CSCS Pager [phone] Office [phone]  05/24/2015, 10:11 AM

## 2015-05-24 NOTE — Progress Notes (Addendum)
  Day of Surgery Note  I have interviewed the patient and examined the patient. I agree with the findings by the PA. Doing well post op.  If no problems overnight, he could potentially go home tomorrow.  He is on ASA and is on a statin  Gae Gallop, MD [phone]   Subjective:  No complaints  Filed Vitals:   05/24/15 1324 05/24/15 1330  BP: 98/54 107/69  Pulse: 61 56  Temp:    Resp: 18 20    Incisions:   Clean and dry Extremities:  Moving all extremities equally Lungs:  Non labored Neuro:  In tact; tongue is midline   Assessment/Plan:  This is a 82 y.o. male who is s/p Left carotid endarterectomy with primary closure  -pt doing well in pacu -neuro is in tact; tongue midline -not requiring any pressor support -to 3 south when bed available   Leontine Locket, PA-C 05/24/2015 1:44 PM

## 2015-05-24 NOTE — Op Note (Signed)
    NAME: Dave Harris  MRN: [id number] DOB: 05-18-1929    DATE OF OPERATION: 05/24/2015  PREOP DIAGNOSIS: Symptomatic left carotid stenosis  POSTOP DIAGNOSIS: Same  PROCEDURE: Left carotid endarterectomy with primary closure  SURGEON: Judeth Cornfield. Scot Dock, MD, FACS  ASSIST: Leontine Locket, PA  ANESTHESIA: Gen.  EBL: minimal  INDICATIONS: Dave Harris is a 82 y.o. male hhe was having repeated  Episodes of syncope. He was found to have a tight left carotid stenosis which was so 3 symptomatic. Left carotid endarterectomy was recommended in order to lower his risk of future stroke.  FINDINGS: He had a hemorrhagic plaque with a 90% stenosis. The artery was large easily taking a 12 mm shunt and therefore elected to close this primarily.  TECHNIQUE: The patient was taken to the operating room after an arterial line was placed by anesthesia. He received a general anesthetic. The left neck was prepped and draped in usual sterile fashion. An incision was made along the anterior border of the sternocleidomastoid and the dissection carried down to the common carotid artery which was controlled with a vessel loop. The facial vein was divided between 2-0 silk ties. The internal carotid artery was controlled above the plaque with a vessel loop. The superior thyroid artery and external carotid arteries were controlled. The patient was heparinized.  Clamps were then placed on the internal then the external then the common carotid artery.  A longitudinal arteriotomy was made in the common carotid artery. This was extended through the plaque into the internal carotid artery. A 12 shunt was placed into the internal carotid artery, backbled and then placed into the common carotid artery and secured with Rummel tourniquet. Flow was reestablished shunt. An endarterectomy plane was established proximal being the plaque was sharply divided. Eversion endarterectomy was performed of the external carotid  artery. Distally it was a nice taper and the internal carotid artery and no tacking sutures were required. The artery was irrigated with copious amounts of heparin and dextran all loose debris removed. Given the size of the artery elected to close this primarily. The artery was closed with 2 running 6-0 Prolene sutures. Prior to completing the closure the shunt was removed. The arteries were backbled and flushed properly and the anastomosis completed. Flow was reestablished first to the external carotid artery and into the internal carotid artery. Completion was a good pulse distal to the closure and excellent Doppler flow.The heparin was partially reversed with protamine. Wounds closed the deep layer 3-0 Vicryl and the platysma closed with running 3-0 Vicryl. The skin was closed with a 4-0 subcuticular stitch. The Caban was applied. The patient tolerated the procedure well and will neurologically intact. All needle and sponge counts were correct.  Deitra Mayo, MD, FACS [facility]  DATE OF DICTATION:   05/24/2015

## 2015-05-24 NOTE — Care Management Note (Signed)
Case Management Note  Patient Details  Name: Dave Harris MRN: [id number] Date of Birth: Jul 27, 1929  Subjective/Objective:  S/p s/p Left carotid endarterectomy with primary closure, from [facility] - ALF, NCM will cont to follow for dc needs.                  Action/Plan:   Expected Discharge Date:                  Expected Discharge Plan:  Assisted Living / Rest Home  In-House Referral:  Clinical Social Work  Discharge planning Services  CM Consult  Post Acute Care Choice:    Choice offered to:     DME Arranged:    DME Agency:     HH Arranged:    HH Agency:     Status of Service:  In process, will continue to follow  Medicare Important Message Given:    Date Medicare IM Given:    Medicare IM give by:    Date Additional Medicare IM Given:    Additional Medicare Important Message give by:     If discussed at [place] of Stay Meetings, dates discussed:    Additional Comments:  Zenon Mayo, RN 05/24/2015, 5:08 PM

## 2015-05-24 NOTE — Interval H&P Note (Signed)
History and Physical Interval Note:  05/24/2015 10:05 AM  Dave Harris  has presented today for surgery, with the diagnosis of Left carotid artery stenosis I65.22  The various methods of treatment have been discussed with the patient and family. After consideration of risks, benefits and other options for treatment, the patient has consented to  Procedure(s): ENDARTERECTOMY CAROTID (Left) as a surgical intervention .  The patient's history has been reviewed, patient examined, no change in status, stable for surgery.  I have reviewed the patient's chart and labs.  Questions were answered to the patient's satisfaction.     Deitra Mayo

## 2015-05-24 NOTE — Telephone Encounter (Signed)
Spoke to pt's daughter to sch appt for 4/18 at 1:30.

## 2015-05-24 NOTE — Anesthesia Postprocedure Evaluation (Signed)
Anesthesia Post Note  Patient: Dave Harris  Procedure(s) Performed: Procedure(s) (LRB): Left ENDARTERECTOMY CAROTID (Left)  Patient location during evaluation: PACU Anesthesia Type: General Level of consciousness: awake and alert Pain management: pain level controlled Vital Signs Assessment: post-procedure vital signs reviewed and stable Respiratory status: spontaneous breathing, nonlabored ventilation, respiratory function stable and patient connected to nasal cannula oxygen Cardiovascular status: blood pressure returned to baseline and stable Postop Assessment: no signs of nausea or vomiting Anesthetic complications: no    Last Vitals:  Filed Vitals:   05/24/15 1239 05/24/15 1254  BP: 109/53 109/58  Pulse: 69 57  Temp:    Resp: 20 19    Last Pain:  Filed Vitals:   05/24/15 1255  PainSc: Asleep                 Alexis Frock

## 2015-05-24 NOTE — Anesthesia Procedure Notes (Signed)
Procedure Name: Intubation Date/Time: 05/24/2015 10:29 AM Performed by: Trixie Deis A Pre-anesthesia Checklist: Patient identified, Emergency Drugs available, Suction available and Patient being monitored Patient Re-evaluated:Patient Re-evaluated prior to inductionOxygen Delivery Method: Circle System Utilized Preoxygenation: Pre-oxygenation with 100% oxygen Intubation Type: IV induction Ventilation: Mask ventilation without difficulty Laryngoscope Size: Miller and 2 Grade View: Grade II Tube type: Oral Tube size: 7.5 mm Number of attempts: 1 Airway Equipment and Method: Stylet Placement Confirmation: ETT inserted through vocal cords under direct vision,  positive ETCO2 and breath sounds checked- equal and bilateral Secured at: 22 cm Tube secured with: Tape Dental Injury: Teeth and Oropharynx as per pre-operative assessment

## 2015-05-24 NOTE — Telephone Encounter (Signed)
-----   Message from Mena Goes, RN sent at 05/24/2015 12:15 PM EDT ----- Regarding: schedule   ----- Message -----    From: Gabriel Earing, PA-C    Sent: 05/24/2015  11:44 AM      To: Vvs Charge Pool  S/p left CEA 05/24/15.  F/u with [facility] in 2 weeks.  Thanks, Aldona Bar

## 2015-05-24 NOTE — Progress Notes (Signed)
[facility]                                                                              Patient Demographics  Dave Harris, is a 82 y.o. male, DOB - June 09, 1929, [id number]  Admit date - 05/22/2015   Admitting Physician Thurnell Lose, MD  Outpatient Primary MD for the patient is [facility] Angelique Blonder., MD  LOS - 2  days    chief complaint : Transfer from [place] HPI   Dave Harris is a 82 y.o. male, History of CAD status post CABG over 10 years ago, chronic atrial fibrillation, multiple CVAs, history of carotid artery stenosis, essential hypertension, dyslipidemia, who has had multiple strokes in the past but recently had another stroke about 4-5 days ago, h/o 5-6 small strokes in the recent past. At [facility] he underwent ECHO which was unremarkable, CT angiogram showing bilateral carotid stenosis, he also had evidence of left embolic CVA on MRI at [facility], his case was discussed with interventional radiologist Dr. Estanislado Pandy who accepted the patient in transfer into the outpatient IR department. 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 to left 80-90% carotid artery stenosis -h/o numerous strokes per pt, no obvious deficits at this time -MRI at [facility] showed small infarcts in the subcortical white matter on the left - CT head 4/7 showed no acute CVA - Carotid duplex at [place] on 4/8 showed 70-99% left carotid shinis - CEREBRAL ANGIOGRAM: Approx 85 % stenosis of LT ICA prox, 90 % stenosis RT ICA cavernous  -reportedly Echo was unremarkable with no embolic source EF [id number], LDL was 55,  -Neurology following closely, recommending aspirin 325 mg daily, continue  statin  - Vascular surgery consulted, patient to undergo left carotid endarterectomy - Per neurology, recommended post CEA if hemodynamically stable, to start Eliquis 2.5 mg BID with aspirin 81 mg daily  CAD status post remote CABG in 1978 -stable, continue beta blocker, aspirin and statin -EKG without acute findings, appreciate cardiology evaluation.    Chronic atrial fibrillation. [place] vasc 2 score >5.  -Continue Coreg, not on anticoagulation at this time, he stopped eliquis >1 month ago. - Neurology recommending anticoagulation post CEA if patient stable  Dyslipidemia. Continue statin.    BPH. On Proscar which will be continued.  Bilateral carotid disease with left-sided stenosis -as above  Code Status: DNR   Family Communication: Discussed in detail with the patient, all imaging results, lab results explained to the patient    Disposition Plan: When cleared by vascular surgery, neurology  Time Spent in minutes  58minutes  Procedures  CEA  Consults   Neurology Cardiology Vasc surgery  DVT Prophylaxis  heparin  Medications  Scheduled Meds: . [MAR Hold] aspirin  325 mg Oral Daily  . [MAR Hold] atorvastatin  40 mg Oral q1800  . [  MAR Hold] carvedilol  12.5 mg Oral BID WC  . cefUROXime (ZINACEF) 1.5 GM IVPB      . [MAR Hold] feeding supplement (ENSURE ENLIVE)  237 mL Oral BID BM  . [MAR Hold] finasteride  5 mg Oral Daily  . [MAR Hold] heparin  5,000 Units Subcutaneous 3 times per day  . [MAR Hold] loratadine  10 mg Oral Daily  . [MAR Hold] mirtazapine  15 mg Oral QHS  . [MAR Hold] multivitamin  1 tablet Oral q morning - 10a  . ondansetron (ZOFRAN) IV  4 mg Intravenous Once  . [MAR Hold] sodium chloride flush  3 mL Intravenous Q12H   Continuous Infusions: . sodium chloride 75 mL/hr at 05/24/15 0002  . lactated ringers 20 mL/hr at 05/24/15 1233   PRN Meds:.[MAR Hold] docusate sodium, fentaNYL (SUBLIMAZE) injection, [MAR Hold] HYDROcodone-acetaminophen,  meperidine (DEMEROL) injection, [MAR Hold] ondansetron **OR** [MAR Hold] ondansetron (ZOFRAN) IV   Antibiotics   Anti-infectives    Start     Dose/Rate Route Frequency Ordered Stop   05/24/15 0845  cefUROXime (ZINACEF) 1.5 g in dextrose 5 % 50 mL IVPB     1.5 g 100 mL/hr over 30 Minutes Intravenous To [facility] 05/24/15 0831 05/24/15 1045   05/24/15 0840  dextrose 5 % with cefUROXime (ZINACEF) ADS Med    Comments:  Dave Harris   : cabinet override      05/24/15 0840 05/24/15 2044        Subjective:   Dave Harris was seen and examined today, Patient denies dizziness, chest pain, shortness of breath, abdominal pain, N/V/D/C, new weakness, numbess, tingling. No acute events overnight.    Objective:   Filed Vitals:   05/24/15 1215 05/24/15 1230 05/24/15 1239 05/24/15 1254  BP: 117/71 119/58 109/53 109/58  Pulse: 57 47 69 57  Temp: 98 F (36.7 C)     TempSrc:      Resp: 28 21 20 19   Height:      Weight:      SpO2: 100% 100% 100% 98%    Intake/Output Summary (Last 24 hours) at 05/24/15 1255 Last data filed at 05/24/15 1213  Gross per 24 hour  Intake   1190 ml  Output   1825 ml  Net   -635 ml     Wt Readings from Last 3 Encounters:  05/24/15 62.687 kg (138 lb 3.2 oz)  11/20/14 70.308 kg (155 lb)  04/03/14 72.712 kg (160 lb 4.8 oz)     Exam  General: Alert and oriented x 3, NAD  HEENT:  Neck:Supple, no JVD  CVS: S1 S2 auscultated, no rubs, murmurs or gallops. Regular rate and rhythm.  Respiratory: Clear to auscultation bilaterally, no wheezing, rales or rhonchi  Abdomen: Soft, nontender, nondistended, + bowel sounds  Ext: no cyanosis clubbing or edema  Neuro: AAOx3, Cr N's II- XII. Strength 5/5 upper and lower extremities bilaterally  Skin: No rashes  Psych: Normal affect and demeanor, alert and oriented x3    Data Reviewed:  I have personally reviewed following labs and imaging studies  Micro Results Recent Results (from the  past 240 hour(s))  Urine culture     Status: None   Collection Time: 05/22/15 10:00 PM  Result Value Ref Range Status   Specimen Description URINE, RANDOM  Final   Special Requests NONE  Final   Culture MULTIPLE SPECIES PRESENT, SUGGEST RECOLLECTION  Final   Report Status 05/24/2015 FINAL  Final  Surgical PCR screen  Status: None   Collection Time: 05/23/15  4:55 PM  Result Value Ref Range Status   MRSA, PCR NEGATIVE NEGATIVE Final   Staphylococcus aureus NEGATIVE NEGATIVE Final    Comment:        The Xpert SA Assay (FDA approved for NASAL specimens in patients over 38 years of age), is one component of a comprehensive surveillance program.  Test performance has been validated by [facility] for patients greater than or equal to 15 year old. It is not intended to diagnose infection nor to guide or monitor treatment.     Radiology Reports Dg Chest Port 1 View  05/22/2015  CLINICAL DATA:  Shortness of Breath EXAM: PORTABLE CHEST 1 VIEW COMPARISON:  None. FINDINGS: Cardiac shadow is mildly enlarged. Postsurgical changes are seen. The lungs are well aerated bilaterally. No focal infiltrate or sizable effusion is seen. IMPRESSION: No active disease. Electronically Signed   By: Inez Catalina M.D.   On: 05/22/2015 19:51    CBC  Recent Labs Lab 05/22/15 2026 05/23/15 0449 05/24/15 0332 05/24/15 1114  WBC 10.7* 11.1* 10.1  --   HGB 14.8 14.4 13.6 13.6  HCT 46.1 44.6 42.5 40.0  PLT 168 150 148*  --   MCV 89.9 88.5 88.5  --   MCH 28.8 28.6 28.3  --   MCHC 32.1 32.3 32.0  --   RDW 14.0 13.9 13.7  --     Chemistries   Recent Labs Lab 05/22/15 2026 05/23/15 0449 05/24/15 0327 05/24/15 1114  NA  --  139 142 140  K  --  3.9 3.9 3.9  CL  --  106 111  --   CO2  --  21* 20*  --   GLUCOSE  --  106* 102*  --   BUN  --  24* 20  --   CREATININE 1.32* 1.17 1.04  --   CALCIUM  --  9.1 8.8*  --     ------------------------------------------------------------------------------------------------------------------ estimated creatinine clearance is 47.7 mL/min (by C-G formula based on Cr of 1.04). ------------------------------------------------------------------------------------------------------------------  Recent Labs  05/22/15 2037  HGBA1C 5.9*   ------------------------------------------------------------------------------------------------------------------  Recent Labs  05/23/15 0449  CHOL 132  HDL 46  LDLCALC 74  TRIG 60  CHOLHDL 2.9   ------------------------------------------------------------------------------------------------------------------ No results for input(s): TSH, T4TOTAL, T3FREE, THYROIDAB in the last 72 hours.  Invalid input(s): FREET3 ------------------------------------------------------------------------------------------------------------------ No results for input(s): VITAMINB12, FOLATE, FERRITIN, TIBC, IRON, RETICCTPCT in the last 72 hours.  Coagulation profile No results for input(s): INR, PROTIME in the last 168 hours.  No results for input(s): DDIMER in the last 72 hours.  Cardiac Enzymes No results for input(s): CKMB, TROPONINI, MYOGLOBIN in the last 168 hours.  Invalid input(s): CK ------------------------------------------------------------------------------------------------------------------ Invalid input(s): POCBNP  No results for input(s): GLUCAP in the last 72 hours.   Rueben Kassim M.D. [facility] 05/24/2015, 12:55 PM  Pager: [phone] Between 7am to 7pm - call Pager - [phone]  After 7pm go to [email or web address] - password [email or web address]  Call night coverage person covering after 7pm

## 2015-05-24 NOTE — H&P (View-Only) (Signed)
   VASCULAR SURGERY ASSESSMENT & PLAN:   Plan Left Carotid Endarterectomy tomorrow if cleared medically. Will leave the decision on need for cardiac clearance to Dr. Candiss Norse.   He is on ASA and is on a statin.  SUBJECTIVE: No new complaints  PHYSICAL EXAM: Filed Vitals:   05/22/15 1813 05/22/15 1843 05/22/15 2041 05/23/15 0444  BP: 132/76 142/78 137/57 151/8  Pulse: 57 64 70 58  Temp:  97.7 F (36.5 C) 97.8 F (36.6 C) 97.5 F (36.4 C)  TempSrc:  Oral Oral Oral  Resp:  15 16 16   Weight:    138 lb (62.596 kg)  SpO2: 99% 99% 98% 96%   Neuro intact  LABS: Lab Results  Component Value Date   WBC 11.1* 05/23/2015   HGB 14.4 05/23/2015   HCT 44.6 05/23/2015   MCV 88.5 05/23/2015   PLT 150 05/23/2015   Lab Results  Component Value Date   CREATININE 1.17 05/23/2015    Active Problems:   Chronic atrial fibrillation (HCC)   Myocardial infarction [facility])   Allergy   Stroke ([place])   CAD (coronary artery disease)   CVA (cerebral infarction)  Gae Gallop Beeper: [phone] 05/23/2015

## 2015-05-24 NOTE — Transfer of Care (Signed)
Immediate Anesthesia Transfer of Care Note  Patient: Dave Harris  Procedure(s) Performed: Procedure(s): Left ENDARTERECTOMY CAROTID (Left)  Patient Location: PACU  Anesthesia Type:General  Level of Consciousness: awake, alert , oriented and patient cooperative  Airway & Oxygen Therapy: Patient Spontanous Breathing and Patient connected to nasal cannula oxygen  Post-op Assessment: Report given to RN, Post -op Vital signs reviewed and stable, Patient moving all extremities and Patient able to stick tongue midline  Post vital signs: Reviewed and stable  Last Vitals:  Filed Vitals:   05/23/15 2021 05/24/15 0529  BP: 107/50 124/63  Pulse: 42 64  Temp: 36.9 C 36.7 C  Resp: 18 16    Complications: No apparent anesthesia complications

## 2015-05-25 ENCOUNTER — Other Ambulatory Visit: Payer: Self-pay

## 2015-05-25 DIAGNOSIS — I2510 Atherosclerotic heart disease of native coronary artery without angina pectoris: Secondary | ICD-10-CM | POA: Diagnosis not present

## 2015-05-25 DIAGNOSIS — I63412 Cerebral infarction due to embolism of left middle cerebral artery: Secondary | ICD-10-CM | POA: Diagnosis not present

## 2015-05-25 DIAGNOSIS — Z951 Presence of aortocoronary bypass graft: Secondary | ICD-10-CM | POA: Diagnosis not present

## 2015-05-25 DIAGNOSIS — I482 Chronic atrial fibrillation: Secondary | ICD-10-CM | POA: Diagnosis not present

## 2015-05-25 DIAGNOSIS — I63132 Cerebral infarction due to embolism of left carotid artery: Secondary | ICD-10-CM | POA: Diagnosis not present

## 2015-05-25 DIAGNOSIS — D62 Acute posthemorrhagic anemia: Secondary | ICD-10-CM | POA: Diagnosis not present

## 2015-05-25 DIAGNOSIS — I6529 Occlusion and stenosis of unspecified carotid artery: Secondary | ICD-10-CM | POA: Diagnosis not present

## 2015-05-25 DIAGNOSIS — I6523 Occlusion and stenosis of bilateral carotid arteries: Secondary | ICD-10-CM | POA: Diagnosis not present

## 2015-05-25 LAB — CBC
HEMATOCRIT: 38.1 % — AB (ref 39.0–52.0)
Hemoglobin: 12.2 g/dL — ABNORMAL LOW (ref 13.0–17.0)
MCH: 28.2 pg (ref 26.0–34.0)
MCHC: 32 g/dL (ref 30.0–36.0)
MCV: 88.2 fL (ref 78.0–100.0)
PLATELETS: 144 10*3/uL — AB (ref 150–400)
RBC: 4.32 MIL/uL (ref 4.22–5.81)
RDW: 13.7 % (ref 11.5–15.5)
WBC: 10.4 10*3/uL (ref 4.0–10.5)

## 2015-05-25 LAB — BASIC METABOLIC PANEL
ANION GAP: 10 (ref 5–15)
BUN: 15 mg/dL (ref 6–20)
CALCIUM: 8.9 mg/dL (ref 8.9–10.3)
CO2: 20 mmol/L — AB (ref 22–32)
CREATININE: 0.98 mg/dL (ref 0.61–1.24)
Chloride: 110 mmol/L (ref 101–111)
GLUCOSE: 121 mg/dL — AB (ref 65–99)
Potassium: 3.9 mmol/L (ref 3.5–5.1)
Sodium: 140 mmol/L (ref 135–145)

## 2015-05-25 MED ORDER — HYDRALAZINE HCL 20 MG/ML IJ SOLN: 10.0000 mg | Freq: Four times a day (QID) | INTRAMUSCULAR | Status: DC | PRN

## 2015-05-25 MED ORDER — HYDROCODONE-ACETAMINOPHEN 5-325 MG PO TABS: 1.0000 | ORAL_TABLET | Freq: Four times a day (QID) | ORAL | Status: DC | PRN

## 2015-05-25 NOTE — Progress Notes (Signed)
Initial Nutrition Assessment  DOCUMENTATION CODES:   Not applicable  INTERVENTION:  -Continue to monitor nutritional needs  NUTRITION DIAGNOSIS:   Inadequate oral intake related to poor appetite as evidenced by meal completion < 50%.  GOAL:   Patient will meet greater than or equal to 90% of their needs  MONITOR:   PO intake, Labs, Weight trends, Skin, I & O's  REASON FOR ASSESSMENT:   Malnutrition Screening Tool    ASSESSMENT:   82 y.o. male, History of CAD status post CABG over 10 years ago, chronic atrial fibrillation, multiple CVAs, history of carotid artery stenosis, essential hypertension, dyslipidemia, who has had multiple strokes in the past but recently had another stroke about 4-5 days ago, h/o 5-6 small strokes in the recent past. At [facility] he underwent ECHO which was unremarkable, CT angiogram showing bilateral carotid stenosis, he also had evidence of left embolic CVA on MRI at [facility], his case was discussed with interventional radiologist Dr. Estanislado Pandy who accepted the patient in transfer into the outpatient IR department.  Pt seen for MST. Pt with BMI categorized as healthy. Pt has not experienced weight loss in the past 6 months. Pt denies poor appetite and N/V PTA. Pt reports that he does not like the taste of the food here. Pt has had 0-25% meal completion and complains of poor appetite during hospital stay. Pt refuses nutritional supplement because he does not like the taste. Pt stated he tried to drink one when he got to the hospital and it caused diarrhea. Intern encouraged pt to order foods that sound good. Intern discussed need for adequate nutrition. Pt seemed generally unhappy to be here and not interested in nutrition at this point. Will continue to monitor and offer nutritional supplements as warranted.   NFPE: mild muscle and fat depletion, no edema. Suspect depletion d/t aging process.   Labs reviewed; glucose 121 mg/dl. Meds reviewed; MVI.    Diet Order:  Diet Heart Room service appropriate?: Yes; Fluid consistency:: Thin  Skin:  Wound (see comment) (incision on neck)  Last BM:  4/13  Height:   Ht Readings from Last 1 Encounters:  05/25/15 5\' 9"  (1.753 m)    Weight:   Wt Readings from Last 1 Encounters:  05/25/15 149 lb (67.586 kg)    Ideal Body Weight:  72.7 kg  BMI:  Body mass index is 21.99 kg/(m^2).  Estimated Nutritional Needs:   Kcal:  1600-1800 (24-26 kcals/kg)  Protein:  80-90 g (1.2-1.3 g/kg)  Fluid:  1.6-1.8 L  EDUCATION NEEDS:   No education needs identified at this time  Geoffery Lyons, [place] Dietetic Intern Pager [phone]

## 2015-05-25 NOTE — Progress Notes (Signed)
[facility]                                                                              Patient Demographics  Dave Harris, is a 82 y.o. male, DOB - November 23, 1929, [id number]  Admit date - 05/22/2015   Admitting Physician Thurnell Lose, MD  Outpatient Primary MD for the patient is [facility] Angelique Blonder., MD  LOS - 3  days    chief complaint : Transfer from [place] HPI   Dave Harris is a 82 y.o. male, History of CAD status post CABG over 10 years ago, chronic atrial fibrillation, multiple CVAs, history of carotid artery stenosis, essential hypertension, dyslipidemia, who has had multiple strokes in the past but recently had another stroke about 4-5 days ago, h/o 5-6 small strokes in the recent past. At [facility] he underwent ECHO which was unremarkable, CT angiogram showing bilateral carotid stenosis, he also had evidence of left embolic CVA on MRI at [facility], his case was discussed with interventional radiologist Dr. Estanislado Pandy who accepted the patient in transfer into the outpatient IR department. 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 to left 80-90% carotid artery stenosis -h/o numerous strokes per pt, no obvious deficits at this time -MRI at [facility] showed small infarcts in the subcortical white matter on the left - CT head 4/7 showed no acute CVA - Carotid duplex at [place] on 4/8 showed 70-99% left carotid shinis - CEREBRAL ANGIOGRAM: Approx 85 % stenosis of LT ICA prox, 90 % stenosis RT ICA cavernous  -reportedly Echo was unremarkable with no embolic source EF [id number], LDL was 55,  -Neurology following closely, recommending aspirin 325 mg daily, continue  statin  - Vascular surgery consulted underwent left carotid endarterectomy 4/13 - Per neurology, recommended post CEA if hemodynamically stable, to start Eliquis 2.5 mg BID with aspirin 81 mg daily  CAD status post remote CABG in 1978 -stable, continue beta blocker, aspirin and statin -EKG without acute findings, appreciate cardiology evaluation.    Chronic atrial fibrillation. [place] vasc 2 score >5.  -Continue Coreg, not on anticoagulation at this time, he stopped eliquis >1 month ago. - Neurology recommending anticoagulation post CEA if patient stable  Dyslipidemia. Continue statin.    BPH. On Proscar which will be continued.  Bilateral carotid disease with left-sided stenosis -Underwent left CEA  Code Status: DNR   Family Communication: Discussed in detail with the patient, all imaging results, lab results explained to the patient    Disposition Plan: Patient debilitated, has not ambulated, start PT evaluation, may need skilled nursing facility. Transfer to telemetry  Time Spent in minutes  25 minutes  Procedures  CEA  Consults   Neurology Cardiology Vasc surgery  DVT Prophylaxis  heparin  Medications  Scheduled Meds: . aspirin  325 mg Oral Daily  . atorvastatin  40 mg Oral q1800  . carvedilol  12.5 mg Oral BID WC  . docusate sodium  100 mg Oral Daily  . feeding supplement (ENSURE ENLIVE)  237 mL Oral BID BM  . finasteride  5 mg Oral Daily  . heparin  5,000 Units Subcutaneous 3 times per day  . loratadine  10 mg Oral Daily  . mirtazapine  15 mg Oral QHS  . multivitamin  1 tablet Oral q morning - 10a  . pantoprazole  40 mg Oral Daily  . sodium chloride flush  3 mL Intravenous Q12H   Continuous Infusions: . sodium chloride 75 mL/hr at 05/24/15 2336   PRN Meds:.acetaminophen **OR** acetaminophen, alum & mag hydroxide-simeth, bisacodyl, docusate sodium, guaiFENesin-dextromethorphan, hydrALAZINE, HYDROcodone-acetaminophen, labetalol, metoprolol, morphine  injection, ondansetron **OR** ondansetron (ZOFRAN) IV, phenol, potassium chloride   Antibiotics   Anti-infectives    Start     Dose/Rate Route Frequency Ordered Stop   05/24/15 2200  cefUROXime (ZINACEF) 1.5 g in dextrose 5 % 50 mL IVPB     1.5 g 100 mL/hr over 30 Minutes Intravenous Every 12 hours 05/24/15 1425 05/25/15 1056   05/24/15 0845  cefUROXime (ZINACEF) 1.5 g in dextrose 5 % 50 mL IVPB     1.5 g 100 mL/hr over 30 Minutes Intravenous To [facility] 05/24/15 0831 05/24/15 1045   05/24/15 0840  dextrose 5 % with cefUROXime (ZINACEF) ADS Med    Comments:  Ara Kussmaul   : cabinet override      05/24/15 0840 05/24/15 2044        Subjective:   Dave Harris was seen and examined today, No complaints. Feels very weak and frail. Patient denies dizziness, chest pain, shortness of breath, abdominal pain, N/V/D/C, numbess, tingling. No acute events overnight.    Objective:   Filed Vitals:   05/24/15 1952 05/24/15 2335 05/25/15 0430 05/25/15 0857  BP: 125/74 118/76 131/64 120/65  Pulse: 87 71 62 62  Temp: 97 F (36.1 C) 97.4 F (36.3 C) 97.6 F (36.4 C) 97.7 F (36.5 C)  TempSrc: Oral Oral Oral Oral  Resp: 16 19 22 21   Height:   5\' 9"  (1.753 m)   Weight:   67.586 kg (149 lb)   SpO2: 98% 99% 96% 100%    Intake/Output Summary (Last 24 hours) at 05/25/15 1151 Last data filed at 05/25/15 0853  Gross per 24 hour  Intake 5301.25 ml  Output   2800 ml  Net 2501.25 ml     Wt Readings from Last 3 Encounters:  05/25/15 67.586 kg (149 lb)  11/20/14 70.308 kg (155 lb)  04/03/14 72.712 kg (160 lb 4.8 oz)     Exam  General: Alert and oriented x 3, NAD  HEENT:  Neck: Supple, no JVD  CVS: S1 S2 clear  Respiratory: CTAB  Abdomen: Soft, nontender, nondistended, + bowel sounds  Ext: no cyanosis clubbing or edema  Neuro: AAOx3, Cr N's II- XII. Strength 5/5 upper and lower extremities bilaterally  Skin: No rashes  Psych: Normal affect and  demeanor, alert and oriented x3    Data Reviewed:  I have personally reviewed following labs and imaging studies  Micro Results Recent Results (from the past 240 hour(s))  Urine culture     Status: None   Collection Time: 05/22/15 10:00 PM  Result Value Ref Range Status   Specimen Description URINE, RANDOM  Final   Special Requests NONE  Final   Culture MULTIPLE SPECIES PRESENT, SUGGEST RECOLLECTION  Final   Report  Status 05/24/2015 FINAL  Final  Surgical PCR screen     Status: None   Collection Time: 05/23/15  4:55 PM  Result Value Ref Range Status   MRSA, PCR NEGATIVE NEGATIVE Final   Staphylococcus aureus NEGATIVE NEGATIVE Final    Comment:        The Xpert SA Assay (FDA approved for NASAL specimens in patients over 47 years of age), is one component of a comprehensive surveillance program.  Test performance has been validated by [facility] for patients greater than or equal to 29 year old. It is not intended to diagnose infection nor to guide or monitor treatment.     Radiology Reports Dg Chest Port 1 View  05/22/2015  CLINICAL DATA:  Shortness of Breath EXAM: PORTABLE CHEST 1 VIEW COMPARISON:  None. FINDINGS: Cardiac shadow is mildly enlarged. Postsurgical changes are seen. The lungs are well aerated bilaterally. No focal infiltrate or sizable effusion is seen. IMPRESSION: No active disease. Electronically Signed   By: Inez Catalina M.D.   On: 05/22/2015 19:51    CBC  Recent Labs Lab 05/22/15 2026 05/23/15 0449 05/24/15 0332 05/24/15 1114 05/25/15 0445  WBC 10.7* 11.1* 10.1  --  10.4  HGB 14.8 14.4 13.6 13.6 12.2*  HCT 46.1 44.6 42.5 40.0 38.1*  PLT 168 150 148*  --  144*  MCV 89.9 88.5 88.5  --  88.2  MCH 28.8 28.6 28.3  --  28.2  MCHC 32.1 32.3 32.0  --  32.0  RDW 14.0 13.9 13.7  --  13.7    Chemistries   Recent Labs Lab 05/22/15 2026 05/23/15 0449 05/24/15 0327 05/24/15 1114 05/25/15 0445  NA  --  139 142 140 140  K  --  3.9 3.9 3.9 3.9  CL   --  106 111  --  110  CO2  --  21* 20*  --  20*  GLUCOSE  --  106* 102*  --  121*  BUN  --  24* 20  --  15  CREATININE 1.32* 1.17 1.04  --  0.98  CALCIUM  --  9.1 8.8*  --  8.9   ------------------------------------------------------------------------------------------------------------------ estimated creatinine clearance is 54.6 mL/min (by C-G formula based on Cr of 0.98). ------------------------------------------------------------------------------------------------------------------  Recent Labs  05/22/15 2037  HGBA1C 5.9*   ------------------------------------------------------------------------------------------------------------------  Recent Labs  05/23/15 0449  CHOL 132  HDL 46  LDLCALC 74  TRIG 60  CHOLHDL 2.9   ------------------------------------------------------------------------------------------------------------------ No results for input(s): TSH, T4TOTAL, T3FREE, THYROIDAB in the last 72 hours.  Invalid input(s): FREET3 ------------------------------------------------------------------------------------------------------------------ No results for input(s): VITAMINB12, FOLATE, FERRITIN, TIBC, IRON, RETICCTPCT in the last 72 hours.  Coagulation profile No results for input(s): INR, PROTIME in the last 168 hours.  No results for input(s): DDIMER in the last 72 hours.  Cardiac Enzymes No results for input(s): CKMB, TROPONINI, MYOGLOBIN in the last 168 hours.  Invalid input(s): CK ------------------------------------------------------------------------------------------------------------------ Invalid input(s): POCBNP  No results for input(s): GLUCAP in the last 72 hours.   RAI,RIPUDEEP M.D. [facility] 05/25/2015, 11:51 AM  Pager: [phone] Between 7am to 7pm - call Pager - [phone]  After 7pm go to [email or web address] - password [email or web address]  Call night coverage person covering after 7pm

## 2015-05-25 NOTE — Progress Notes (Addendum)
Progress Note    05/25/2015 7:18 AM 3 Days Post-Op  Subjective:  States he had a headache, but this got better; denies any trouble swallowing.  afebrile HR  50's-70's  [id number] systolic 0000000 RA  Gtts:  none  Filed Vitals:   05/24/15 2335 05/25/15 0430  BP: 118/76 131/64  Pulse: 71 62  Temp: 97.4 F (36.3 C) 97.6 F (36.4 C)  Resp: 19 22     Physical Exam: Neuro:  In tact Lungs:  Non labored Incision:  Clean and dry  CBC    Component Value Date/Time   WBC 10.4 05/25/2015 0445   RBC 4.32 05/25/2015 0445   HGB 12.2* 05/25/2015 0445   HCT 38.1* 05/25/2015 0445   PLT 144* 05/25/2015 0445   MCV 88.2 05/25/2015 0445   MCH 28.2 05/25/2015 0445   MCHC 32.0 05/25/2015 0445   RDW 13.7 05/25/2015 0445   LYMPHSABS 2.2 06/10/2012 1509   MONOABS 1.0 06/10/2012 1509   EOSABS 0.6 06/10/2012 1509   BASOSABS 0.1 06/10/2012 1509    BMET    Component Value Date/Time   NA 140 05/25/2015 0445   K 3.9 05/25/2015 0445   CL 110 05/25/2015 0445   CO2 20* 05/25/2015 0445   GLUCOSE 121* 05/25/2015 0445   BUN 15 05/25/2015 0445   CREATININE 0.98 05/25/2015 0445   CALCIUM 8.9 05/25/2015 0445   GFRNONAA >60 05/25/2015 0445   GFRAA >60 05/25/2015 0445     Intake/Output Summary (Last 24 hours) at 05/25/15 0718 Last data filed at 05/25/15 0400  Gross per 24 hour  Intake 5301.25 ml  Output   2150 ml  Net 3151.25 ml     Assessment/Plan:  This is a 82 y.o. male who is s/p left CEA 1 Days Post-Op  -pt is doing well this am.   -c/o headache yesterday, but this has improved -pt neuro exam is in tact -acute surgical blood loss anemia-pt tolerating -pt has not ambulated-needs to walk. -will give Tramadol for pain medication  -pt has voided -f/u with Dr. Scot Dock in 2 weeks.   Leontine Locket, PA-C [facility] [phone]   --- For [facility] use ---  Modified Rankin score at D/C (0-6): Rankin Score=0  IV medication needed for:  1.  Hypertension: No 2. Hypotension: No  Post-op Complications: No  1. Post-op CVA or TIA: No  If yes: Event classification (right eye, left eye, right cortical, left cortical, verterobasilar, other): n/a  If yes: Timing of event (intra-op, <6 hrs post-op, >=6 hrs post-op, unknown): n/a  2. CN injury: No  If yes: CN n/a injuried   3. Myocardial infarction: No  If yes: Dx by (EKG or clinical, Troponin): n/a  4.  CHF: No  5.  Dysrhythmia (new): No  6. Wound infection: No  7. Reperfusion symptoms: No  8. Return to OR: No  If yes: return to OR for (bleeding, neurologic, other CEA incision, other): n/a  Discharge medications: Statin use:  Yes ASA use:  Yes Beta blocker use:  Yes ACE-Inhibitor use:  No P2Y12 Antagonist use: [x ] None, [ ]  Plavix, [ ]  Plasugrel, [ ]  Ticlopinine, [ ]  Ticagrelor, [ ]  Other, [ ]  No for medical reason, [ ]  Non-compliant, [ ]  Not-indicated Anti-coagulant use:  [x ] None, [ ]  Warfarin, [ ]  Rivaroxaban, [ ]  Dabigatran, [ ]  Other, [ ]  No for medical reason, [ ]  Non-compliant, [ ]  Not-indicated   I agree with the above.  I have seen and  evaluated the patient.  He is postoperative day #1 status post left carotid endarterectomy.  He is neurologically intact and hemodynamically stable.  From a vascular standpoint he is cleared for discharge with follow-up in 2 weeks with Dr. Berenice Bouton

## 2015-05-25 NOTE — Evaluation (Signed)
Physical Therapy Evaluation Patient Details Name: Dave Harris MRN: [id number] DOB: 05-26-1929 Today's Date: 05/25/2015   History of Present Illness  Dave Harris is a 82 y.o. male, History of CAD status post CABG over 10 years ago, chronic atrial fibrillation, multiple CVAs, history of carotid artery stenosis, essential hypertension, dyslipidemia, who has had multiple strokes in the past but recently had another stroke about 4-5 days ago, h/o 5-6 small strokes in the recent past. At [facility] he underwent ECHO which was unremarkable, CT angiogram showing bilateral carotid stenosis, he also had evidence of left embolic CVA on MRI at [place]. Had an angiogram on 05/22/15. Pt now s/p Lt carotid endarterectomy on 05/24/15.   Clinical Impression  Pt seen for re-evaluation, now s/p Lt carotid endarterectomy on 05/24/15. At this time the patient is modified independent with bed mobility and min guard for balance and safety with transfers and ambulation. Based upon the patient's current mobility level, recommending short stay at [facility] for further rehabilitation. PT to continue to follow to progress mobility as tolerated.     Follow Up Recommendations SNF;Supervision for mobility/OOB    Equipment Recommendations  None recommended by PT    Recommendations for Other Services       Precautions / Restrictions Precautions Precautions: Fall Restrictions Weight Bearing Restrictions: No      Mobility  Bed Mobility Overal bed mobility: Modified Independent             General bed mobility comments: HOB elevated and using rail to assist.   Transfers Overall transfer level: Needs assistance Equipment used: Rolling walker (2 wheeled) Transfers: Sit to/from Stand Sit to Stand: Min guard         General transfer comment: min guard provided for safety due to instability.  Ambulation/Gait Ambulation/Gait assistance: Min guard Ambulation Distance (Feet): 160  Feet Assistive device: Rolling walker (2 wheeled) Gait Pattern/deviations: Step-through pattern;Narrow base of support Gait velocity: decreased   General Gait Details: During ambulation the pt had mild instability but no frank loss of balance. Distance limited by patient's report of fatigue and LEs feeling weak.   Stairs            Wheelchair Mobility    Modified Rankin (Stroke Patients Only)       Balance Overall balance assessment: Needs assistance Sitting-balance support: No upper extremity supported Sitting balance-Leahy Scale: Good     Standing balance support: Bilateral upper extremity supported Standing balance-Leahy Scale: Poor Standing balance comment: using rw for support                             Pertinent Vitals/Pain Pain Assessment: Faces Faces Pain Scale: Hurts a little bit Pain Location: throat Pain Descriptors / Indicators: Sore Pain Intervention(s): Monitored during session    Home Living Family/patient expects to be discharged to:: Assisted living               Home Equipment: Walker - 2 wheels      Prior Function Level of Independence: Needs assistance   Gait / Transfers Assistance Needed: intermittant use of rw PTA.     Comments: pt reports that he was independent with everything except meals which he walked to 3Xday.      Hand Dominance        Extremity/Trunk Assessment   Upper Extremity Assessment: Overall WFL for tasks assessed           Lower Extremity Assessment: Generalized weakness  Cervical / Trunk Assessment: Kyphotic  Communication   Communication: HOH  Cognition Arousal/Alertness: Awake/alert Behavior During Therapy: WFL for tasks assessed/performed Overall Cognitive Status: No family/caregiver present to determine baseline cognitive functioning (history of dementia)                      General Comments      Exercises        Assessment/Plan    PT Assessment Patient  needs continued PT services  PT Diagnosis Difficulty walking   PT Problem List Decreased strength;Decreased activity tolerance;Decreased balance;Decreased mobility;Decreased safety awareness  PT Treatment Interventions DME instruction;Gait training;Functional mobility training;Therapeutic activities;Therapeutic exercise;Balance training;Patient/family education   PT Goals (Current goals can be found in the Care Plan section) Acute Rehab PT Goals Patient Stated Goal: go home PT Goal Formulation: With patient Time For Goal Achievement: 06/08/15 Potential to Achieve Goals: Good    Frequency Min 2X/week   Barriers to discharge        Co-evaluation               End of Session Equipment Utilized During Treatment: Gait belt Activity Tolerance: Patient limited by fatigue Patient left: in chair;with call bell/phone within reach Nurse Communication: Mobility status         Time: [id number] PT Time Calculation (min) (ACUTE ONLY): 19 min   Charges:   PT Evaluation $PT Re-evaluation: 1 Procedure     PT G Codes:        Cassell Clement, PT, CSCS Pager [phone] Office [phone]  05/25/2015, 3:56 PM

## 2015-05-25 NOTE — Progress Notes (Signed)
Pt received from ICU. VSS, CCMD notified. Pt oriented to room and equipment. Will continue to monitor.   Fritz Pickerel, RN

## 2015-05-25 NOTE — Care Management Important Message (Signed)
Important Message  Patient Details  Name: NOHEA MATHIESEN MRN: [id number] Date of Birth: 07-Jun-1929   Medicare Important Message Given:  Yes    Marshaun Lortie Abena 05/25/2015, 11:14 AM

## 2015-05-25 NOTE — Progress Notes (Signed)
Pt continues to be bradycardic in atrial fibrillation. Pt HR ranges 40-60. Pt experiencing pauses that last 2-3 seconds. Pt asymptomatic and denies lightheadedness or palpitations. MD Rai paged - received verbal orders to discontinue carvedilol and obtain EKG. Will continue to monitor.   Fritz Pickerel, RN

## 2015-05-25 NOTE — Progress Notes (Signed)
Pt transferred to 2w per MD order, report called to receiving nurse and all questions answered. Family made aware of transfer as well.

## 2015-05-26 DIAGNOSIS — I2510 Atherosclerotic heart disease of native coronary artery without angina pectoris: Secondary | ICD-10-CM | POA: Diagnosis not present

## 2015-05-26 DIAGNOSIS — I6523 Occlusion and stenosis of bilateral carotid arteries: Secondary | ICD-10-CM | POA: Diagnosis not present

## 2015-05-26 DIAGNOSIS — I482 Chronic atrial fibrillation: Secondary | ICD-10-CM | POA: Diagnosis not present

## 2015-05-26 DIAGNOSIS — I63412 Cerebral infarction due to embolism of left middle cerebral artery: Secondary | ICD-10-CM | POA: Diagnosis not present

## 2015-05-26 DIAGNOSIS — I6529 Occlusion and stenosis of unspecified carotid artery: Secondary | ICD-10-CM | POA: Diagnosis not present

## 2015-05-26 MED ORDER — DOCUSATE SODIUM 100 MG PO CAPS: 100.0000 mg | ORAL_CAPSULE | Freq: Every day | ORAL | Status: DC

## 2015-05-26 MED ORDER — ASPIRIN EC 81 MG PO TBEC
81.0000 mg | DELAYED_RELEASE_TABLET | Freq: Every day | ORAL | Status: DC
  Administered 2015-05-26: 81 mg via ORAL
  Filled 2015-05-26: qty 1

## 2015-05-26 MED ORDER — PANTOPRAZOLE SODIUM 40 MG PO TBEC: 40.0000 mg | DELAYED_RELEASE_TABLET | Freq: Every day | ORAL | Status: DC

## 2015-05-26 MED ORDER — HYDROCODONE-ACETAMINOPHEN 5-325 MG PO TABS: 1.0000 | ORAL_TABLET | Freq: Four times a day (QID) | ORAL | Status: DC | PRN

## 2015-05-26 MED ORDER — APIXABAN 2.5 MG PO TABS
2.5000 mg | ORAL_TABLET | Freq: Two times a day (BID) | ORAL | Status: DC
  Administered 2015-05-26: 2.5 mg via ORAL
  Filled 2015-05-26: qty 1

## 2015-05-26 MED ORDER — APIXABAN 2.5 MG PO TABS: 2.5000 mg | ORAL_TABLET | Freq: Two times a day (BID) | ORAL | Status: DC

## 2015-05-26 MED ORDER — BISACODYL 10 MG RE SUPP: 10.0000 mg | Freq: Every day | RECTAL | Status: DC | PRN

## 2015-05-26 NOTE — Discharge Summary (Signed)
Physician Discharge Summary   Patient ID: Dave Harris MRN: [id number] DOB/AGE: March 13, 1929 82 y.o.  Admit date: 05/22/2015 Discharge date: 05/26/2015  Primary Care Physician:  Angelina Sheriff., MD  Discharge Diagnoses:     Bilateral carotid disease status post left carotid endarterectomy  . Chronic atrial fibrillation ([place]) . bradycardia  . Allergy . CAD (coronary artery disease) . acute CVA (cerebral infarction)  Consults:   Cardiology Neurology Vascular surgery  Recommendations for Outpatient Follow-up:  1. Home health PT, OT, home health aide, RN 2. Please repeat CBC/BMET at next visit 3. Patient may be placed back on amlodipine if his BP has been elevated on the evaluations. He was noted to have bradycardia with beta blocker hence Coreg was discontinued   DIET: Heart healthy diet    Allergies:   Allergies  Allergen Reactions  . Plavix [Clopidogrel Bisulfate] Other (See Comments)    PT STATES MED DID NOT MAKE HIM FEEL WELL  . Xarelto [Rivaroxaban] Other (See Comments)    bleeding     DISCHARGE MEDICATIONS: Current Discharge Medication List    START taking these medications   Details  apixaban (ELIQUIS) 2.5 MG TABS tablet Take 1 tablet (2.5 mg total) by mouth 2 (two) times daily. Qty: 60 tablet, Refills: 1    bisacodyl (DULCOLAX) 10 MG suppository Place 1 suppository (10 mg total) rectally daily as needed for moderate constipation. Qty: 12 suppository, Refills: 0    HYDROcodone-acetaminophen (NORCO/VICODIN) 5-325 MG tablet Take 1 tablet by mouth every 6 (six) hours as needed for moderate pain. Qty: 20 tablet, Refills: 0    pantoprazole (PROTONIX) 40 MG tablet Take 1 tablet (40 mg total) by mouth daily. Qty: 30 tablet, Refills: 1      CONTINUE these medications which have CHANGED   Details  docusate sodium (COLACE) 100 MG capsule Take 1 capsule (100 mg total) by mouth daily. Qty: 30 capsule, Refills: 0      CONTINUE these medications which  have NOT CHANGED   Details  aspirin 81 MG tablet Take 81 mg by mouth daily.    atorvastatin (LIPITOR) 40 MG tablet Take 40 mg by mouth at bedtime.     fexofenadine (ALLEGRA) 180 MG tablet Take 180 mg by mouth daily.    finasteride (PROSCAR) 5 MG tablet Take 5 mg by mouth daily.    mirtazapine (REMERON) 30 MG tablet Take 30 mg by mouth at bedtime.    Multiple Vitamins-Minerals (PRESERVISION AREDS PO) Take 1 tablet by mouth every morning.    vitamin B-12 (CYANOCOBALAMIN) 1000 MCG tablet Take 2,000 mcg by mouth daily.      STOP taking these medications     amLODipine (NORVASC) 5 MG tablet      carvedilol (COREG) 12.5 MG tablet          Brief H and P: For complete details please refer to admission H and P, but in brief Dave Harris is a 82 y.o. male, History of CAD status post CABG over 10 years ago, chronic atrial fibrillation, multiple CVAs, history of carotid artery stenosis, essential hypertension, dyslipidemia, who has had multiple strokes in the past but recently had another stroke about 4-5 days ago, h/o 5-6 small strokes in the recent past. At [facility] he underwent ECHO which was unremarkable, CT angiogram showing bilateral carotid stenosis, he also had evidence of left embolic CVA on MRI at [facility], his case was discussed with interventional radiologist Dr. Estanislado Pandy who accepted the patient in transfer into the outpatient IR  department. In the IR department he underwent carotid angiogram confirming bilateral carotid artery disease with at least 80-90% stenosis on the left side which is the same side of his recent stroke, and then admitted to Floor VVS consulted plan for L CEA   Hospital Course:  Recent left embolic CVA likely due to left 80-90% carotid artery stenosis- h/o numerous strokes per pt, no obvious deficits at this time -MRI at [facility] showed small infarcts in the subcortical white matter on the left - CT head 4/7 showed no acute CVA - Carotid  duplex at [place] on 4/8 showed 70-99% left carotid shinis - CEREBRAL ANGIOGRAM: Approx 85 % stenosis of LT ICA prox, 90 % stenosis RT ICA cavernous  -reportedly Echo was unremarkable with no embolic source EF [id number], LDL was 55,  -Neurology following closely, recommending aspirin 325 mg daily, continue statin  - Vascular surgery consulted underwent left carotid endarterectomy 4/13 - Per neurology, recommended post CEA if hemodynamically stable, to start Eliquis 2.5 mg BID with aspirin 81 mg daily  CAD status post remote CABG in 1978 -stable, continue aspirin and statin -EKG without acute findings, appreciate cardiology evaluation.   Chronic atrial fibrillation. [place] vasc 2 score >5.  -Continue Coreg, not on anticoagulation at the time of presentation, he had stopped eliquis >1 month ago. - Neurology recommending anticoagulation post CEA, patient placed on aspirin 81 mg daily with eliquis 0.5 mg twice a day per nephrology recommendations.  Dyslipidemia. Continue statin.   BPH. On Proscar which will be continued.  Bilateral carotid disease with left-sided stenosis -Underwent left CEA, follow up outpatient with Dr. Scot Dock  Code Status: DNR   Procedures  CEA    Day of Discharge BP 133/64 mmHg  Pulse 51  Temp(Src) 97.5 F (36.4 C) (Oral)  Resp 18  Ht 5\' 9"  (1.753 m)  Wt 67.586 kg (149 lb)  BMI 21.99 kg/m2  SpO2 99%  Physical Exam: General: Alert and awake oriented x3 not in any acute distress. HEENT: anicteric sclera, pupils reactive to light and accommodation CVS: S1-S2 clear no murmur rubs or gallops Chest: clear to auscultation bilaterally, no wheezing rales or rhonchi Abdomen: soft nontender, nondistended, normal bowel sounds Extremities: no cyanosis, clubbing or edema noted bilaterally Neuro: Cranial nerves II-XII intact, no focal neurological deficits   The results of significant diagnostics from this hospitalization (including imaging, microbiology,  ancillary and laboratory) are listed below for reference.    LAB RESULTS: Basic Metabolic Panel:  Recent Labs Lab 05/24/15 0327 05/24/15 1114 05/25/15 0445  NA 142 140 140  K 3.9 3.9 3.9  CL 111  --  110  CO2 20*  --  20*  GLUCOSE 102*  --  121*  BUN 20  --  15  CREATININE 1.04  --  0.98  CALCIUM 8.8*  --  8.9   Liver Function Tests: No results for input(s): AST, ALT, ALKPHOS, BILITOT, PROT, ALBUMIN in the last 168 hours. No results for input(s): LIPASE, AMYLASE in the last 168 hours. No results for input(s): AMMONIA in the last 168 hours. CBC:  Recent Labs Lab 05/24/15 0332 05/24/15 1114 05/25/15 0445  WBC 10.1  --  10.4  HGB 13.6 13.6 12.2*  HCT 42.5 40.0 38.1*  MCV 88.5  --  88.2  PLT 148*  --  144*   Cardiac Enzymes: No results for input(s): CKTOTAL, CKMB, CKMBINDEX, TROPONINI in the last 168 hours. BNP: Invalid input(s): POCBNP CBG: No results for input(s): GLUCAP in the last 168 hours.  Significant Diagnostic Studies:  Dg Chest Port 1 View  05/22/2015  CLINICAL DATA:  Shortness of Breath EXAM: PORTABLE CHEST 1 VIEW COMPARISON:  None. FINDINGS: Cardiac shadow is mildly enlarged. Postsurgical changes are seen. The lungs are well aerated bilaterally. No focal infiltrate or sizable effusion is seen. IMPRESSION: No active disease. Electronically Signed   By: Inez Catalina M.D.   On: 05/22/2015 19:51       Disposition and Follow-up: Discharge Instructions    Ambulatory referral to Neurology    Complete by:  As directed   Pt will follow up with Dr. Erlinda Hong at [facility] in about 2 months. Thanks.     CAROTID Sugery: Call MD for difficulty swallowing or speaking; weakness in arms or legs that is a new symtom; severe headache.  If you have increased swelling in the neck and/or  are having difficulty breathing, CALL 911    Complete by:  As directed      Call MD for:  redness, tenderness, or signs of infection (pain, swelling, bleeding, redness, odor or green/yellow discharge  around incision site)    Complete by:  As directed      Call MD for:  severe or increased pain, loss or decreased feeling  in affected limb(s)    Complete by:  As directed      Call MD for:  temperature >100.5    Complete by:  As directed      Diet - low sodium heart healthy    Complete by:  As directed      Discharge wound care:    Complete by:  As directed   Shower daily with soap and water starting 05/26/15     Driving Restrictions    Complete by:  As directed   No driving for 2 weeks     Increase activity slowly    Complete by:  As directed      Lifting restrictions    Complete by:  As directed   No lifting for 2 weeks     Resume previous diet    Complete by:  As directed             DISPOSITION: ALF   DISCHARGE FOLLOW-UP Follow-up Information    Follow up with Xu,Jindong, MD. Schedule an appointment as soon as possible for a visit in 2 months.   Specialty:  Neurology   Why:  stroke clinic   Contact information:   [address] [place] [address] [phone]       Follow up with Deitra Mayo, MD In 2 weeks.   Specialties:  Vascular Surgery, Cardiology   Why:  Office will call you to arrange your appt (sent)   Contact information:   [place] [address] [phone]       Follow up with [facility] Angelique Blonder., MD. Schedule an appointment as soon as possible for a visit in 2 weeks.   Specialty:  Family Medicine   Why:  for hospital follow-up   Contact information:   [place]  [address] [phone]        Time spent on Discharge: 51mins   Signed:   RAI,RIPUDEEP M.D. [facility] 05/26/2015, 11:54 AM Pager: [phone]

## 2015-05-26 NOTE — Discharge Instructions (Signed)
Information on my medicine - ELIQUIS® (apixaban) ° °This medication education was reviewed with me or my healthcare representative as part of my discharge preparation.  The pharmacist that spoke with me during my hospital stay was:  Gisella Alwine Rhea, RPH ° °Why was Eliquis® prescribed for you? °Eliquis® was prescribed for you to reduce the risk of a blood clot forming that can cause a stroke if you have a medical condition called atrial fibrillation (a type of irregular heartbeat). ° °What do You need to know about Eliquis® ? °Take your Eliquis® TWICE DAILY - one tablet in the morning and one tablet in the evening with or without food. If you have difficulty swallowing the tablet whole please discuss with your pharmacist how to take the medication safely. ° °Take Eliquis® exactly as prescribed by your doctor and DO NOT stop taking Eliquis® without talking to the doctor who prescribed the medication.  Stopping may increase your risk of developing a stroke.  Refill your prescription before you run out. ° °After discharge, you should have regular check-up appointments with your healthcare provider that is prescribing your Eliquis®.  In the future your dose may need to be changed if your kidney function or weight changes by a significant amount or as you get older. ° °What do you do if you miss a dose? °If you miss a dose, take it as soon as you remember on the same day and resume taking twice daily.  Do not take more than one dose of ELIQUIS at the same time to make up a missed dose. ° °Important Safety Information °A possible side effect of Eliquis® is bleeding. You should call your healthcare provider right away if you experience any of the following: °  Bleeding from an injury or your nose that does not stop. °  Unusual colored urine (red or dark brown) or unusual colored stools (red or black). °  Unusual bruising for unknown reasons. °  A serious fall or if you hit your head (even if there is no  bleeding). ° °Some medicines may interact with Eliquis® and might increase your risk of bleeding or clotting while on Eliquis®. To help avoid this, consult your healthcare provider or pharmacist prior to using any new prescription or non-prescription medications, including herbals, vitamins, non-steroidal anti-inflammatory drugs (NSAIDs) and supplements. ° °This website has more information on Eliquis® (apixaban): http://www.eliquis.com/eliquis/home ° °

## 2015-05-26 NOTE — Progress Notes (Signed)
Per Dr. Tana Coast, patient is stable for d/c today. Physical Therapy requests SNF- however patient is adamant about returning to [facility] [facility] where he is a current resident. CSW spoke to [place] at the facility; DC summary and updated Fl2 sent to facility for review.  Spoke to patient's daughter Sinda Du. She wants father to return to facility and plans to transport her via car. She will bring patient clothes.  Discussed with patient's nurse and notified patient of d/c plan. He was pleased with dc plan.  Daughter requested that patient receive strong encouragement re: need for PT as he has declined therapies in the past. CSW discussed with patient the importance of accepting home health services due to recent medical issues.  No further CSW needs identified. CSW signing off.  Lorie Phenix. Pauline Good, [place]  (weekend coverage)

## 2015-05-26 NOTE — NC FL2 (Signed)
[place] LEVEL OF CARE SCREENING TOOL     IDENTIFICATION  Patient Name: Dave Harris Birthdate: [birth year] Sex: male Admission Date (Current Location): 05/22/2015  [facility] and [place] Number:  Herbalist and Address:  [facility] [place] [place] [address], [place], [place] [address]      Provider Number:    Attending Physician Name and Address:  Mendel Corning, MD  Relative Name and Phone Number:       Current Level of Care: Hospital Recommended Level of Care: [place] Prior Approval Number:    Date Approved/Denied:   PASRR Number:    Discharge Plan: Domiciliary (Rest home)    Current Diagnoses: Patient Active Problem List   Diagnosis Date Noted  . Carotid stenosis   . HLD (hyperlipidemia)   . CVA (cerebral infarction) 05/22/2015  . Chronic atrial fibrillation ([place])   . Myocardial infarction ([place])   . Allergy   . Stroke ([place])   . CAD (coronary artery disease)   . A-fib ([place]) 06/10/2012  . Bruit 06/10/2012  . Hyperlipidemia   . Heart disease     Orientation RESPIRATION BLADDER Height & Weight     Self, Time, Situation, Place  Normal Continent Weight: 149 lb (67.586 kg) Height:  5\' 9"  (175.3 cm)  BEHAVIORAL SYMPTOMS/MOOD NEUROLOGICAL BOWEL NUTRITION STATUS      Continent Diet (Heart Healthy)  AMBULATORY STATUS COMMUNICATION OF NEEDS Skin   Limited Assist Verbally Other (Comment), Surgical wounds (Surgical incision L neck -skin adheisive closure, Ecchymosis R and L Arm)                       Personal Care Assistance Level of Assistance  Dressing Bathing Assistance: Limited assistance   Dressing Assistance: Limited assistance     Functional Limitations Info             SPECIAL CARE FACTORS FREQUENCY  PT (By licensed PT)                    Contractures Contractures Info: Not present    Additional Factors Info  Allergies Code Status Info: DNR Allergies Info: Plavix,  Xarelto           Current Medications (05/26/2015):  This is the current hospital active medication list Current Facility-Administered Medications  Medication Dose Route Frequency Provider Last Rate Last Dose  . acetaminophen (TYLENOL) tablet 325-650 mg  325-650 mg Oral Q4H PRN Hulen Shouts Rhyne, PA-C   650 mg at 05/25/15 1924   Or  . acetaminophen (TYLENOL) suppository 325-650 mg  325-650 mg Rectal Q4H PRN Hulen Shouts Rhyne, PA-C      . alum & mag hydroxide-simeth (MAALOX/MYLANTA) 200-200-20 MG/5ML suspension 15-30 mL  15-30 mL Oral Q2H PRN Samantha J Rhyne, PA-C      . apixaban (ELIQUIS) tablet 2.5 mg  2.5 mg Oral BID Ripudeep Krystal Eaton, MD   2.5 mg at 05/26/15 0949  . aspirin EC tablet 81 mg  81 mg Oral Daily Ripudeep Krystal Eaton, MD   81 mg at 05/26/15 0949  . atorvastatin (LIPITOR) tablet 40 mg  40 mg Oral q1800 Thurnell Lose, MD   40 mg at 05/25/15 1725  . bisacodyl (DULCOLAX) suppository 10 mg  10 mg Rectal Daily PRN Samantha J Rhyne, PA-C      . docusate sodium (COLACE) capsule 100 mg  100 mg Oral Daily PRN Domenic Polite, MD      .  docusate sodium (COLACE) capsule 100 mg  100 mg Oral Daily Hulen Shouts Rhyne, PA-C   100 mg at 05/26/15 0949  . feeding supplement (ENSURE ENLIVE) (ENSURE ENLIVE) liquid 237 mL  237 mL Oral BID BM Thurnell Lose, MD   237 mL at 05/25/15 1500  . finasteride (PROSCAR) tablet 5 mg  5 mg Oral Daily Thurnell Lose, MD   5 mg at 05/26/15 0949  . guaiFENesin-dextromethorphan (ROBITUSSIN DM) 100-10 MG/5ML syrup 15 mL  15 mL Oral Q4H PRN Samantha J Rhyne, PA-C      . hydrALAZINE (APRESOLINE) injection 10 mg  10 mg Intravenous Q6H PRN Ripudeep K Rai, MD      . HYDROcodone-acetaminophen (NORCO/VICODIN) 5-325 MG per tablet 1-2 tablet  1-2 tablet Oral Q4H PRN Thurnell Lose, MD   2 tablet at 05/26/15 0805  . loratadine (CLARITIN) tablet 10 mg  10 mg Oral Daily Thurnell Lose, MD   10 mg at 05/26/15 0949  . mirtazapine (REMERON) tablet 15 mg  15 mg Oral QHS Thurnell Lose, MD   15 mg at 05/25/15 2225  . morphine 2 MG/ML injection 1-2 mg  1-2 mg Intravenous Q2H PRN Samantha J Rhyne, PA-C      . multivitamin (PROSIGHT) tablet 1 tablet  1 tablet Oral q morning - 10a Thurnell Lose, MD   1 tablet at 05/26/15 0949  . ondansetron (ZOFRAN) tablet 4 mg  4 mg Oral Q6H PRN Thurnell Lose, MD       Or  . ondansetron (ZOFRAN) injection 4 mg  4 mg Intravenous Q6H PRN Thurnell Lose, MD      . pantoprazole (PROTONIX) EC tablet 40 mg  40 mg Oral Daily Hulen Shouts Rhyne, PA-C   40 mg at 05/26/15 0949  . phenol (CHLORASEPTIC) mouth spray 1 spray  1 spray Mouth/Throat PRN Samantha J Rhyne, PA-C      . potassium chloride SA (K-DUR,KLOR-CON) CR tablet 20-40 mEq  20-40 mEq Oral Daily PRN Samantha J Rhyne, PA-C      . sodium chloride flush (NS) 0.9 % injection 3 mL  3 mL Intravenous Q12H Thurnell Lose, MD   3 mL at 05/26/15 0950     Discharge Medications:  DISCHARGE MEDICATIONS: Current Discharge Medication List    START taking these medications   Details  apixaban (ELIQUIS) 2.5 MG TABS tablet Take 1 tablet (2.5 mg total) by mouth 2 (two) times daily. Qty: 60 tablet, Refills: 1    bisacodyl (DULCOLAX) 10 MG suppository Place 1 suppository (10 mg total) rectally daily as needed for moderate constipation. Qty: 12 suppository, Refills: 0    HYDROcodone-acetaminophen (NORCO/VICODIN) 5-325 MG tablet Take 1 tablet by mouth every 6 (six) hours as needed for moderate pain. Qty: 20 tablet, Refills: 0    pantoprazole (PROTONIX) 40 MG tablet Take 1 tablet (40 mg total) by mouth daily. Qty: 30 tablet, Refills: 1      CONTINUE these medications which have CHANGED   Details  docusate sodium (COLACE) 100 MG capsule Take 1 capsule (100 mg total) by mouth daily. Qty: 30 capsule, Refills: 0      CONTINUE these medications which have NOT CHANGED   Details  aspirin 81 MG tablet Take 81 mg by mouth daily.    atorvastatin (LIPITOR) 40 MG  tablet Take 40 mg by mouth at bedtime.     fexofenadine (ALLEGRA) 180 MG tablet Take 180 mg by mouth daily.    finasteride (PROSCAR)  5 MG tablet Take 5 mg by mouth daily.    mirtazapine (REMERON) 30 MG tablet Take 30 mg by mouth at bedtime.    Multiple Vitamins-Minerals (PRESERVISION AREDS PO) Take 1 tablet by mouth every morning.    vitamin B-12 (CYANOCOBALAMIN) 1000 MCG tablet Take 2,000 mcg by mouth daily.      STOP taking these medications     amLODipine (NORVASC) 5 MG tablet      carvedilol (COREG) 12.5 MG tablet                Relevant Imaging Results:  Relevant Lab Results:   Additional Information   SSN  [phone]  Will need PT for strengthening and RN checks for incision on Neck.. P.lz see DC summary for [place] orders  Prakriti Carignan, Lorie Phenix, LCSW

## 2015-05-26 NOTE — NC FL2 (Signed)
[place] LEVEL OF CARE SCREENING TOOL     IDENTIFICATION  Patient Name: Dave Harris Birthdate: [birth year] Sex: male Admission Date (Current Location): 05/22/2015  [facility] and [place] Number:  Herbalist and Address:  [facility], [place] [address] [place], [place] [address]      Provider Number:    Attending Physician Name and Address:  Mendel Corning, MD  Relative Name and Phone Number:       Current Level of Care: Hospital Recommended Level of Care: [place] Prior Approval Number:    Date Approved/Denied:   PASRR Number:    Discharge Plan: Domiciliary (Rest home)    Current Diagnoses: Patient Active Problem List   Diagnosis Date Noted  . Carotid stenosis   . HLD (hyperlipidemia)   . CVA (cerebral infarction) 05/22/2015  . Chronic atrial fibrillation ([place])   . Myocardial infarction ([place])   . Allergy   . Stroke ([place])   . CAD (coronary artery disease)   . A-fib ([place]) 06/10/2012  . Bruit 06/10/2012  . Hyperlipidemia   . Heart disease     Orientation RESPIRATION BLADDER Height & Weight     Self, Time, Situation, Place  Normal Continent Weight: 149 lb (67.586 kg) Height:  5\' 9"  (175.3 cm)  BEHAVIORAL SYMPTOMS/MOOD NEUROLOGICAL BOWEL NUTRITION STATUS      Continent Diet (Heart Healthy)  AMBULATORY STATUS COMMUNICATION OF NEEDS Skin   Limited Assist Verbally Other (Comment), Surgical wounds (Surgical incision L neck -skin adheisive closure, Ecchymosis R and L Arm)                       Personal Care Assistance Level of Assistance  Dressing Bathing Assistance: Limited assistance   Dressing Assistance: Limited assistance     Functional Limitations Info             SPECIAL CARE FACTORS FREQUENCY  PT (By licensed PT)                    Contractures Contractures Info: Not present    Additional Factors Info  Allergies Code Status Info: DNR Allergies Info: Plavix,  Xarelto           Current Medications (05/26/2015):  This is the current hospital active medication list Current Facility-Administered Medications  Medication Dose Route Frequency Provider Last Rate Last Dose  . acetaminophen (TYLENOL) tablet 325-650 mg  325-650 mg Oral Q4H PRN Hulen Shouts Rhyne, PA-C   650 mg at 05/25/15 1924   Or  . acetaminophen (TYLENOL) suppository 325-650 mg  325-650 mg Rectal Q4H PRN Hulen Shouts Rhyne, PA-C      . alum & mag hydroxide-simeth (MAALOX/MYLANTA) 200-200-20 MG/5ML suspension 15-30 mL  15-30 mL Oral Q2H PRN Samantha J Rhyne, PA-C      . apixaban (ELIQUIS) tablet 2.5 mg  2.5 mg Oral BID Ripudeep Krystal Eaton, MD   2.5 mg at 05/26/15 0949  . aspirin EC tablet 81 mg  81 mg Oral Daily Ripudeep Krystal Eaton, MD   81 mg at 05/26/15 0949  . atorvastatin (LIPITOR) tablet 40 mg  40 mg Oral q1800 Thurnell Lose, MD   40 mg at 05/25/15 1725  . bisacodyl (DULCOLAX) suppository 10 mg  10 mg Rectal Daily PRN Samantha J Rhyne, PA-C      . docusate sodium (COLACE) capsule 100 mg  100 mg Oral Daily PRN Domenic Polite, MD      .  docusate sodium (COLACE) capsule 100 mg  100 mg Oral Daily Hulen Shouts Rhyne, PA-C   100 mg at 05/26/15 0949  . feeding supplement (ENSURE ENLIVE) (ENSURE ENLIVE) liquid 237 mL  237 mL Oral BID BM Thurnell Lose, MD   237 mL at 05/25/15 1500  . finasteride (PROSCAR) tablet 5 mg  5 mg Oral Daily Thurnell Lose, MD   5 mg at 05/26/15 0949  . guaiFENesin-dextromethorphan (ROBITUSSIN DM) 100-10 MG/5ML syrup 15 mL  15 mL Oral Q4H PRN Samantha J Rhyne, PA-C      . hydrALAZINE (APRESOLINE) injection 10 mg  10 mg Intravenous Q6H PRN Ripudeep K Rai, MD      . HYDROcodone-acetaminophen (NORCO/VICODIN) 5-325 MG per tablet 1-2 tablet  1-2 tablet Oral Q4H PRN Thurnell Lose, MD   2 tablet at 05/26/15 0805  . loratadine (CLARITIN) tablet 10 mg  10 mg Oral Daily Thurnell Lose, MD   10 mg at 05/26/15 0949  . mirtazapine (REMERON) tablet 15 mg  15 mg Oral QHS Thurnell Lose, MD   15 mg at 05/25/15 2225  . morphine 2 MG/ML injection 1-2 mg  1-2 mg Intravenous Q2H PRN Samantha J Rhyne, PA-C      . multivitamin (PROSIGHT) tablet 1 tablet  1 tablet Oral q morning - 10a Thurnell Lose, MD   1 tablet at 05/26/15 0949  . ondansetron (ZOFRAN) tablet 4 mg  4 mg Oral Q6H PRN Thurnell Lose, MD       Or  . ondansetron (ZOFRAN) injection 4 mg  4 mg Intravenous Q6H PRN Thurnell Lose, MD      . pantoprazole (PROTONIX) EC tablet 40 mg  40 mg Oral Daily Hulen Shouts Rhyne, PA-C   40 mg at 05/26/15 0949  . phenol (CHLORASEPTIC) mouth spray 1 spray  1 spray Mouth/Throat PRN Samantha J Rhyne, PA-C      . potassium chloride SA (K-DUR,KLOR-CON) CR tablet 20-40 mEq  20-40 mEq Oral Daily PRN Samantha J Rhyne, PA-C      . sodium chloride flush (NS) 0.9 % injection 3 mL  3 mL Intravenous Q12H Thurnell Lose, MD   3 mL at 05/26/15 0950     Discharge Medications: Please see discharge summary for a list of discharge medications.  Relevant Imaging Results:  Relevant Lab Results:   Additional Information   SSN  [id number]  Will need PT for strengthening and RN checks for incision on Neck.. P.lz see DC summary for [place] orders  Hiroto Saltzman, Lorie Phenix, LCSW

## 2015-05-29 ENCOUNTER — Encounter (HOSPITAL_COMMUNITY): Payer: Self-pay | Admitting: Vascular Surgery

## 2015-06-05 ENCOUNTER — Telehealth (HOSPITAL_COMMUNITY): Payer: Self-pay

## 2015-06-05 ENCOUNTER — Encounter: Payer: Self-pay | Admitting: Vascular Surgery

## 2015-06-05 NOTE — Telephone Encounter (Signed)
Called to schedule f/u with Dr. Estanislado Pandy for stenosis. Pt's daughter stated that pt lives in nursing facility and already has a couple f/u appointments set up. She says that it is hard to get to [place] and will give [place] a call when she is available to schedule. AW

## 2015-06-08 ENCOUNTER — Ambulatory Visit (INDEPENDENT_AMBULATORY_CARE_PROVIDER_SITE_OTHER): Payer: Self-pay | Admitting: Vascular Surgery

## 2015-06-08 ENCOUNTER — Encounter: Payer: Self-pay | Admitting: Vascular Surgery

## 2015-06-08 VITALS — BP 160/84 | HR 60 | Temp 97.2°F | Resp 14 | Ht 69.0 in | Wt 142.0 lb

## 2015-06-08 DIAGNOSIS — I6522 Occlusion and stenosis of left carotid artery: Secondary | ICD-10-CM

## 2015-06-08 DIAGNOSIS — Z48812 Encounter for surgical aftercare following surgery on the circulatory system: Secondary | ICD-10-CM

## 2015-06-08 NOTE — Progress Notes (Signed)
   Patient name: Dave Harris MRN: [id number] DOB: [birth year] Sex: male  REASON FOR VISIT: Follow up after left carotid endarterectomy.  HPI: Dave Harris is a 82 y.o. male who was having repeated episodes of syncope. He was found to have a tight left carotid stenosis. Left carotid endarterectomy was recommended in order to lower his risk of future stroke. Of note he had a fairly large artery and for this reason the endarterectomy site was closed primarily. His surgery was on 05/24/2015. He comes in for a 2 week follow up visit. Of note, his preoperative cerebral arteriogram showed no significant stenosis on the right.  He denies any focal weakness or paresthesias. He has no problems swallowing. He has had no further problems with syncope.  He is on aspirin and is on a statin.  Current Outpatient Prescriptions  Medication Sig Dispense Refill  . apixaban (ELIQUIS) 2.5 MG TABS tablet Take 1 tablet (2.5 mg total) by mouth 2 (two) times daily. 60 tablet 1  . aspirin 81 MG tablet Take 81 mg by mouth daily.    Marland Kitchen atorvastatin (LIPITOR) 40 MG tablet Take 40 mg by mouth at bedtime.     . bisacodyl (DULCOLAX) 10 MG suppository Place 1 suppository (10 mg total) rectally daily as needed for moderate constipation. 12 suppository 0  . docusate sodium (COLACE) 100 MG capsule Take 1 capsule (100 mg total) by mouth daily. 30 capsule 0  . fexofenadine (ALLEGRA) 180 MG tablet Take 180 mg by mouth daily.    . finasteride (PROSCAR) 5 MG tablet Take 5 mg by mouth daily.    Marland Kitchen HYDROcodone-acetaminophen (NORCO/VICODIN) 5-325 MG tablet Take 1 tablet by mouth every 6 (six) hours as needed for moderate pain. 20 tablet 0  . mirtazapine (REMERON) 30 MG tablet Take 30 mg by mouth at bedtime.    . Multiple Vitamins-Minerals (PRESERVISION AREDS PO) Take 1 tablet by mouth every morning.    . pantoprazole (PROTONIX) 40 MG tablet Take 1 tablet (40 mg total) by mouth daily. 30 tablet 1  . vitamin B-12  (CYANOCOBALAMIN) 1000 MCG tablet Take 2,000 mcg by mouth daily.     No current facility-administered medications for this visit.    REVIEW OF SYSTEMS:  [X]  denotes positive finding, [ ]  denotes negative finding Cardiac  Comments:  Chest pain or chest pressure:    Shortness of breath upon exertion:    Short of breath when lying flat:    Irregular heart rhythm:    Constitutional    Fever or chills:      PHYSICAL EXAM: There were no vitals filed for this visit.  GENERAL: The patient is a well-nourished male, in no acute distress. The vital signs are documented above. CARDIOVASCULAR: There is a regular rate and rhythm. PULMONARY: There is good air exchange bilaterally without wheezing or rales. His left neck incision is healing nicely. NEURO: He has no focal weakness or paresthesias.  MEDICAL ISSUES:  STATUS POST LEFT CAROTID ENDARTERECTOMY: The patient is doing well status post left carotid endarterectomy. He is on aspirin and is on a statin. I have ordered a carotid duplex scan in 6 months and I'll see him back at that time. He knows to call sooner if he has problems. In the meantime he knows to continue taking his aspirin and statin.  Deitra Mayo [facility]: [phone]

## 2015-06-19 ENCOUNTER — Other Ambulatory Visit: Payer: Self-pay

## 2015-06-19 DIAGNOSIS — I10 Essential (primary) hypertension: Secondary | ICD-10-CM | POA: Diagnosis not present

## 2015-06-19 DIAGNOSIS — Z9889 Other specified postprocedural states: Secondary | ICD-10-CM | POA: Insufficient documentation

## 2015-06-19 DIAGNOSIS — E7800 Pure hypercholesterolemia, unspecified: Secondary | ICD-10-CM | POA: Diagnosis not present

## 2015-06-19 DIAGNOSIS — I25119 Atherosclerotic heart disease of native coronary artery with unspecified angina pectoris: Secondary | ICD-10-CM | POA: Diagnosis not present

## 2015-06-19 HISTORY — DX: Other specified postprocedural states: Z98.890

## 2015-06-19 NOTE — Patient Outreach (Signed)
[place] [name]) Care Management  06/19/2015  Dave Harris Sep 17, 1929 [id number]   Telephone call to patient regarding health team advantage high risk referral. Person answering phone states patient is at [facility] in [place], [place] receiving his care.    PLAN:  RNCM will refer patient to [name] to close due to patient receiving care in facility. RNCM will notify patients primary MD of closure   [name] RN,BSN,CCM [facility] Telephonic  [phone]

## 2015-07-10 NOTE — Addendum Note (Signed)
Addended by: Mena Goes on: 07/10/2015 05:26 PM   Modules accepted: Orders

## 2015-07-24 DIAGNOSIS — J209 Acute bronchitis, unspecified: Secondary | ICD-10-CM | POA: Diagnosis not present

## 2015-07-24 DIAGNOSIS — D510 Vitamin B12 deficiency anemia due to intrinsic factor deficiency: Secondary | ICD-10-CM | POA: Diagnosis not present

## 2015-07-24 DIAGNOSIS — I639 Cerebral infarction, unspecified: Secondary | ICD-10-CM | POA: Diagnosis not present

## 2015-07-24 DIAGNOSIS — R51 Headache: Secondary | ICD-10-CM | POA: Diagnosis not present

## 2015-07-24 DIAGNOSIS — M1990 Unspecified osteoarthritis, unspecified site: Secondary | ICD-10-CM | POA: Diagnosis not present

## 2015-07-30 DIAGNOSIS — J069 Acute upper respiratory infection, unspecified: Secondary | ICD-10-CM | POA: Diagnosis not present

## 2015-07-30 DIAGNOSIS — R7989 Other specified abnormal findings of blood chemistry: Secondary | ICD-10-CM | POA: Diagnosis not present

## 2015-07-30 DIAGNOSIS — R51 Headache: Secondary | ICD-10-CM | POA: Diagnosis not present

## 2015-07-30 DIAGNOSIS — R05 Cough: Secondary | ICD-10-CM | POA: Diagnosis not present

## 2015-07-30 DIAGNOSIS — D649 Anemia, unspecified: Secondary | ICD-10-CM | POA: Diagnosis not present

## 2015-07-31 DIAGNOSIS — N400 Enlarged prostate without lower urinary tract symptoms: Secondary | ICD-10-CM | POA: Diagnosis not present

## 2015-07-31 DIAGNOSIS — Z8673 Personal history of transient ischemic attack (TIA), and cerebral infarction without residual deficits: Secondary | ICD-10-CM | POA: Diagnosis not present

## 2015-07-31 DIAGNOSIS — F329 Major depressive disorder, single episode, unspecified: Secondary | ICD-10-CM | POA: Diagnosis not present

## 2015-07-31 DIAGNOSIS — E785 Hyperlipidemia, unspecified: Secondary | ICD-10-CM | POA: Diagnosis not present

## 2015-08-06 DIAGNOSIS — D649 Anemia, unspecified: Secondary | ICD-10-CM | POA: Diagnosis not present

## 2015-08-14 DIAGNOSIS — R51 Headache: Secondary | ICD-10-CM | POA: Diagnosis not present

## 2015-08-14 DIAGNOSIS — G4489 Other headache syndrome: Secondary | ICD-10-CM | POA: Diagnosis not present

## 2015-08-14 DIAGNOSIS — R110 Nausea: Secondary | ICD-10-CM | POA: Diagnosis not present

## 2015-08-14 DIAGNOSIS — I4891 Unspecified atrial fibrillation: Secondary | ICD-10-CM | POA: Diagnosis not present

## 2015-08-14 DIAGNOSIS — I509 Heart failure, unspecified: Secondary | ICD-10-CM | POA: Diagnosis not present

## 2015-08-14 DIAGNOSIS — R531 Weakness: Secondary | ICD-10-CM | POA: Diagnosis not present

## 2015-08-14 DIAGNOSIS — R404 Transient alteration of awareness: Secondary | ICD-10-CM | POA: Diagnosis not present

## 2015-08-20 DIAGNOSIS — I4891 Unspecified atrial fibrillation: Secondary | ICD-10-CM | POA: Diagnosis not present

## 2015-08-20 DIAGNOSIS — D692 Other nonthrombocytopenic purpura: Secondary | ICD-10-CM | POA: Diagnosis not present

## 2015-08-20 DIAGNOSIS — L919 Hypertrophic disorder of the skin, unspecified: Secondary | ICD-10-CM | POA: Diagnosis not present

## 2015-08-22 ENCOUNTER — Ambulatory Visit (INDEPENDENT_AMBULATORY_CARE_PROVIDER_SITE_OTHER): Payer: PPO | Admitting: Neurology

## 2015-08-22 ENCOUNTER — Encounter: Payer: Self-pay | Admitting: Neurology

## 2015-08-22 VITALS — BP 154/84 | HR 81 | Ht 69.0 in | Wt 151.0 lb

## 2015-08-22 DIAGNOSIS — I482 Chronic atrial fibrillation, unspecified: Secondary | ICD-10-CM

## 2015-08-22 DIAGNOSIS — I6523 Occlusion and stenosis of bilateral carotid arteries: Secondary | ICD-10-CM

## 2015-08-22 DIAGNOSIS — E785 Hyperlipidemia, unspecified: Secondary | ICD-10-CM

## 2015-08-22 DIAGNOSIS — Z7901 Long term (current) use of anticoagulants: Secondary | ICD-10-CM | POA: Diagnosis not present

## 2015-08-22 DIAGNOSIS — I63412 Cerebral infarction due to embolism of left middle cerebral artery: Secondary | ICD-10-CM | POA: Diagnosis not present

## 2015-08-22 DIAGNOSIS — I10 Essential (primary) hypertension: Secondary | ICD-10-CM

## 2015-08-22 HISTORY — DX: Cerebral infarction due to embolism of left middle cerebral artery: I63.412

## 2015-08-22 HISTORY — DX: Long term (current) use of anticoagulants: Z79.01

## 2015-08-22 NOTE — Progress Notes (Signed)
STROKE NEUROLOGY FOLLOW UP NOTE  NAME: Dave Harris DOB: 1929/04/09  REASON FOR VISIT: stroke follow up HISTORY FROM: pt and chart and daughters  Today we had the pleasure of seeing Dave Harris at our Neurology Clinic. Pt was accompanied by 2 daughters.   History Summary Dave Harris is a 82 y.o. male with history of afib, CAD, HLD, HTN, MI, CVA transferred from [facility] for left brain stroke and left ICA stenosis on 05/22/15. Symptoms resolved. MRI found left subcortical WM infarcts. CUS left ICA 70-99% stenosis. Cerebral angio left ICA proximal 85% stenosis and right ICA cavernous 90% stenosis. TTE EF 55-60% and LDL 74 with A1C 5.9. VVS consulted and pt had left CEA done without complication. After CEA, pt was put on eliquis 2.5mg  bid because of his borderline weight, fluctuating Cre and fall hx of concurrent use of ASA. He was discharged with eliquis and ASA as well as lipitor.   Interval History During the interval time, the patient has been doing well. Followed with VVS and will repeat CUS in 6 months. Has recent episode of heart racing and was sent to ER by PCP for evaluation. The RVR episode resolved by its own. He has not seen cardiology yet.  Currently lives in ALF, no side effects from medication. Walk with walker today but at home walk without device. BP 154/84.   REVIEW OF SYSTEMS: Full 14 system review of systems performed and notable only for those listed below and in HPI above, all others are negative:  Constitutional:   Cardiovascular:  Ear/Nose/Throat:  Hearing loss Skin:  Eyes:   Respiratory:   Gastroitestinal:   Genitourinary:  Hematology/Lymphatic:   Endocrine:  Musculoskeletal:  Joint pain, cramps Allergy/Immunology:  Allergies, runny nose Neurological:  HA, weakness Psychiatric:  Sleep:   The following represents the patient's updated allergies and side effects list: Allergies  Allergen Reactions  . Plavix  [Clopidogrel Bisulfate] Other (See Comments)    PT STATES MED DID NOT MAKE HIM FEEL WELL  . Xarelto [Rivaroxaban] Other (See Comments)    bleeding    The neurologically relevant items on the patient's problem list were reviewed on today's visit.  Neurologic Examination  A problem focused neurological exam (12 or more points of the single system neurologic examination, vital signs counts as 1 point, cranial nerves count for 8 points) was performed.  Blood pressure 154/84, pulse 81, height 5\' 9"  (1.753 m), weight 151 lb (68.493 kg).  General - Well nourished, well developed, in no apparent distress.  Ophthalmologic - Fundi not visualized due to noncooperation.  Cardiovascular - irregularly irregular heart rate and rhythm.  Mental Status -  Level of arousal and orientation to time, place, and person were intact. Language including expression, naming, repetition, comprehension was assessed and found intact. Fund of Knowledge was assessed and was intact.  Cranial Nerves II - XII - II - Visual field intact OU. III, IV, VI - Extraocular movements intact. V - Facial sensation intact bilaterally. VII - Facial movement intact bilaterally. VIII - Hard of hearing & vestibular intact bilaterally. X - Palate elevates symmetrically. XI - Chin turning & shoulder shrug intact bilaterally. XII - Tongue protrusion intact.  Motor Strength - The patient's strength was normal in all extremities and pronator drift was absent.  Bulk was normal and fasciculations were absent.   Motor Tone - Muscle tone was assessed at the neck and appendages and was normal.  Reflexes - The patient's reflexes  were 1+ in all extremities and he had no pathological reflexes.  Sensory - Light touch, temperature/pinprick were assessed and were normal.    Coordination - The patient had normal movements in the hands and feet with no ataxia or dysmetria.  Tremor was absent.  Gait and Station - walk with walker, mild  stooped posturing but steady.   Functional score  mRS = 3   0 - No symptoms.   1 - No significant disability. Able to carry out all usual activities, despite some symptoms.   2 - Slight disability. Able to look after own affairs without assistance, but unable to carry out all previous activities.   3 - Moderate disability. Requires some help, but able to walk unassisted.   4 - Moderately severe disability. Unable to attend to own bodily needs without assistance, and unable to walk unassisted.   5 - Severe disability. Requires constant nursing care and attention, bedridden, incontinent.   6 - Dead.   NIH Stroke Scale = 0   Data reviewed: I was not able to reviewed the radiological images below but obtained from documentation.  MRI: MRI at [facility] showed small infarcts in the subcortical white matter on the left.  CT HEAD: His CT of the head on 05/18/15 showed no acute stroke.  CAROTID DUPLEX: His carotid duplex at [facility] on 05/19/2015 showed a 70-99% left carotid stenosis.  I personally reviewed the images and agree with the radiology interpretations.  CEREBRAL ANGIOGRAM:   1.Approx 85 % stenosis of LT ICA prox. 2.Approx 90 % stenosis RT ICA cavernous seg with post stenotic dilatation  EKG - afib  Component     Latest Ref Rng 05/22/2015 05/23/2015  Cholesterol     0 - 200 mg/dL  132  Triglycerides     <150 mg/dL  60  HDL Cholesterol     >40 mg/dL  46  Total CHOL/HDL Ratio       2.9  VLDL     0 - 40 mg/dL  12  LDL (calc)     0 - 99 mg/dL  74  Hemoglobin A1C     4.8 - 5.6 % 5.9 (H)   Mean Plasma Glucose      123       Assessment: As you may recall, he is a 82 y.o. Caucasian male with PMH of afib, CAD, HLD, HTN, MI, CVA transferred from [facility] for left brain stroke and left ICA stenosis on 05/22/15. Symptoms resolved. MRI found left subcortical WM infarcts. CUS left ICA 70-99% stenosis. Cerebral angio left ICA proximal 85% stenosis and  right ICA cavernous 90% stenosis. TTE EF 55-60% and LDL 74 with A1C 5.9. VVS consulted and pt had left CEA done without complication. After CEA, pt was put on eliquis 2.5mg  bid because of his borderline weight, fluctuating Cre and fall hx of concurrent use of ASA. He was discharged with eliquis and ASA as well as lipitor. During the interval time, the patient has been doing well. Followed with VVS and will repeat CUS in 6 months. He has not seen cardiology yet.     Plan:  - continue eliquis 2.5mg  bid and ASA 81mg  and lipitor for stroke prevention - check BP at ALF and record and BP goal 130-150 due to right ICA 90% stenosis - follow up with cardiology in [place] for afib management - follow up with vascular surgery as scheduled - Follow up with your primary care physician for stroke risk factor modification. Recommend  maintain blood pressure goal around 130-150/80-90, diabetes with hemoglobin A1c goal below 7.0% and lipids with LDL cholesterol goal below 70 mg/dL.  - follow up in 3 months.  I spent more than 25 minutes of face to face time with the patient. Greater than 50% of time was spent in counseling and coordination of care. We discussed BP management in the setting of ICA stenosis, medication compliance, follow up with Cardiology and VVS. Avoid fall.    No orders of the defined types were placed in this encounter.    Meds ordered this encounter  Medications  . atorvastatin (LIPITOR) 40 MG tablet    Sig: Take 40 mg by mouth.  . DISCONTD: bisacodyl (LAXATIVE) 10 MG suppository    Sig: Place 10 mg rectally. Reported on 08/22/2015  . docusate sodium (COLACE) 100 MG capsule    Sig: Take 100 mg by mouth.  . DISCONTD: fexofenadine (ALLEGRA) 180 MG tablet    Sig: Take 180 mg by mouth. Reported on 08/22/2015  . DISCONTD: finasteride (PROSCAR) 5 MG tablet    Sig: Take 5 mg by mouth. Reported on 08/22/2015  . HYDROcodone-acetaminophen (NORCO/VICODIN) 5-325 MG tablet    Sig: Take by mouth.  .  mirtazapine (REMERON) 30 MG tablet    Sig: Take 30 mg by mouth.  . nitroGLYCERIN (NITROSTAT) 0.4 MG SL tablet    Sig: Place 0.4 mg under the tongue.  . Vitamins/Minerals TABS    Sig: Take by mouth.  . predniSONE (STERAPRED UNI-PAK 21 TAB) 10 MG (21) TBPK tablet    Sig:   . LINZESS 145 MCG CAPS capsule    Sig:   . AMITIZA 8 MCG capsule    Sig:     Patient Instructions  - continue eliquis and baby ASA and lipitor for stroke prevention - check BP at ALF and record and BP goal 130-150 - follow up with cardiology in [facility] for left chest wall pain and afib management - follow up with vascular surgery as scheduled - Follow up with your primary care physician for stroke risk factor modification. Recommend maintain blood pressure goal around 130-150/80-90, diabetes with hemoglobin A1c goal below 7.0% and lipids with LDL cholesterol goal below 70 mg/dL.  - follow up in 3 months.    Rosalin Hawking, MD PhD [facility] [address], [place], [place] [address] [phone]

## 2015-08-22 NOTE — Patient Instructions (Addendum)
-   continue eliquis and baby ASA and lipitor for stroke prevention - check BP at ALF and record and BP goal 130-150 - follow up with cardiology in [facility] for left chest wall pain and afib management - follow up with vascular surgery as scheduled - Follow up with your primary care physician for stroke risk factor modification. Recommend maintain blood pressure goal around 130-150/80-90, diabetes with hemoglobin A1c goal below 7.0% and lipids with LDL cholesterol goal below 70 mg/dL.  - follow up in 3 months.

## 2015-09-18 ENCOUNTER — Telehealth (HOSPITAL_COMMUNITY): Payer: Self-pay

## 2015-09-18 NOTE — Telephone Encounter (Signed)
Called to schedule f/u, left message for pt's daughter to return call. AW

## 2015-09-21 ENCOUNTER — Ambulatory Visit: Payer: PPO | Admitting: Neurology

## 2015-11-19 DIAGNOSIS — K219 Gastro-esophageal reflux disease without esophagitis: Secondary | ICD-10-CM | POA: Diagnosis not present

## 2015-11-19 DIAGNOSIS — E785 Hyperlipidemia, unspecified: Secondary | ICD-10-CM | POA: Diagnosis not present

## 2015-11-19 DIAGNOSIS — I4891 Unspecified atrial fibrillation: Secondary | ICD-10-CM | POA: Diagnosis not present

## 2015-11-26 DIAGNOSIS — Z23 Encounter for immunization: Secondary | ICD-10-CM | POA: Diagnosis not present

## 2015-11-27 ENCOUNTER — Ambulatory Visit: Payer: PPO | Admitting: Neurology

## 2015-12-04 DIAGNOSIS — R51 Headache: Secondary | ICD-10-CM | POA: Diagnosis not present

## 2015-12-04 DIAGNOSIS — R55 Syncope and collapse: Secondary | ICD-10-CM | POA: Diagnosis not present

## 2015-12-04 DIAGNOSIS — H539 Unspecified visual disturbance: Secondary | ICD-10-CM | POA: Diagnosis not present

## 2015-12-06 ENCOUNTER — Other Ambulatory Visit (HOSPITAL_COMMUNITY): Payer: Self-pay | Admitting: Interventional Radiology

## 2015-12-06 DIAGNOSIS — I771 Stricture of artery: Secondary | ICD-10-CM

## 2015-12-10 DIAGNOSIS — R51 Headache: Secondary | ICD-10-CM | POA: Diagnosis not present

## 2015-12-10 DIAGNOSIS — I5022 Chronic systolic (congestive) heart failure: Secondary | ICD-10-CM | POA: Diagnosis not present

## 2015-12-10 DIAGNOSIS — I10 Essential (primary) hypertension: Secondary | ICD-10-CM | POA: Diagnosis not present

## 2015-12-11 ENCOUNTER — Ambulatory Visit (HOSPITAL_COMMUNITY)
Admission: RE | Admit: 2015-12-11 | Discharge: 2015-12-11 | Disposition: A | Discharge status: Home / Self Care | Payer: PPO | Source: Ambulatory Visit | Attending: Interventional Radiology | Admitting: Interventional Radiology

## 2015-12-11 ENCOUNTER — Ambulatory Visit (INDEPENDENT_AMBULATORY_CARE_PROVIDER_SITE_OTHER): Payer: PPO | Admitting: Neurology

## 2015-12-11 ENCOUNTER — Encounter: Payer: Self-pay | Admitting: Neurology

## 2015-12-11 VITALS — BP 126/76 | HR 68 | Ht 69.0 in | Wt 161.0 lb

## 2015-12-11 DIAGNOSIS — I2583 Coronary atherosclerosis due to lipid rich plaque: Secondary | ICD-10-CM

## 2015-12-11 DIAGNOSIS — I63412 Cerebral infarction due to embolism of left middle cerebral artery: Secondary | ICD-10-CM

## 2015-12-11 DIAGNOSIS — I2510 Atherosclerotic heart disease of native coronary artery without angina pectoris: Secondary | ICD-10-CM | POA: Diagnosis not present

## 2015-12-11 DIAGNOSIS — F4321 Adjustment disorder with depressed mood: Secondary | ICD-10-CM

## 2015-12-11 DIAGNOSIS — F4320 Adjustment disorder, unspecified: Secondary | ICD-10-CM | POA: Diagnosis not present

## 2015-12-11 DIAGNOSIS — Z7901 Long term (current) use of anticoagulants: Secondary | ICD-10-CM

## 2015-12-11 DIAGNOSIS — I6523 Occlusion and stenosis of bilateral carotid arteries: Secondary | ICD-10-CM | POA: Diagnosis not present

## 2015-12-11 DIAGNOSIS — I482 Chronic atrial fibrillation, unspecified: Secondary | ICD-10-CM

## 2015-12-11 DIAGNOSIS — I771 Stricture of artery: Secondary | ICD-10-CM

## 2015-12-11 HISTORY — DX: Adjustment disorder with depressed mood: F43.21

## 2015-12-11 HISTORY — DX: Adjustment disorder, unspecified: F43.20

## 2015-12-11 NOTE — Patient Instructions (Addendum)
-   will increase eliquis from 2.5mg  twice a day to 5mg  twice a day as your kidney function improved and you gained weight - continue ASA 81mg  and lipitor for stroke prevention - check BP and record and BP goal 130-150 due to right ICA 90% stenosis - follow up with cardiology in [place] for afib management - follow up with vascular surgery next week - Follow up with your primary care physician for stroke risk factor modification. Recommend maintain blood pressure goal around 130-150/80-90, diabetes with hemoglobin A1c goal below 7.0% and lipids with LDL cholesterol goal below 70 mg/dL.  - relaxation and avoid stress are the key - avoid fall, walk with cane or walker if needed. - follow up in 3 months.

## 2015-12-11 NOTE — Progress Notes (Signed)
STROKE NEUROLOGY FOLLOW UP NOTE  NAME: Dave Harris DOB: 08/08/1929  REASON FOR VISIT: stroke follow up HISTORY FROM: pt and chart and daughters  Today we had the pleasure of seeing Dave Harris in follow-up at our Neurology Clinic. Pt was accompanied by 2 daughters.   History Summary Dave Harris is a 82 y.o. male with history of afib, CAD, HLD, HTN, MI, CVA transferred from [facility] for left brain stroke and left ICA stenosis on 05/22/15. Symptoms resolved. MRI found left subcortical WM infarcts. CUS left ICA 70-99% stenosis. Cerebral angio left ICA proximal 85% stenosis and right ICA cavernous 90% stenosis. TTE EF 55-60% and LDL 74 with A1C 5.9. VVS consulted and pt had left CEA done without complication. After CEA, pt was put on eliquis 2.5mg  bid because of his borderline weight, fluctuating Cre and fall hx of concurrent use of ASA. He was discharged with eliquis and ASA as well as lipitor.   Follow up 08/22/15 - the patient has been doing well. Followed with VVS and will repeat CUS in 6 months. Has recent episode of heart racing and was sent to ER by PCP for evaluation. The RVR episode resolved by its own. He has not seen cardiology yet.  Currently lives in ALF, no side effects from medication. Walk with walker today but at home walk without device. BP 154/84.   Interval History During the interval time, the pt has been doing well until 2 weeks ago when his wife passed away. Although they have not been living together for some time, he attended her funeral and since then he started to have dizziness on head motion. If stay still he is OK, with head motion he feel lightheadedness. He felt not steady on walking. He was sent to ER at [facility] and was told to have high BP, and sent back to ALF after BP stabilized. at ALF his multiple orthostatic vitals did not show orthostatic hypotension. He started to have frontal HA and neck tightness. He was also not  happy with her children recently. He has VVS follow up next week and repeat CUS at that time. Today in clinic, he came in with cane, but stated that he can walk without cane. BP 126/76.   REVIEW OF SYSTEMS: Full 14 system review of systems performed and notable only for those listed below and in HPI above, all others are negative:  Constitutional:   Cardiovascular:  Ear/Nose/Throat:  Hearing loss Skin:  Eyes:   Respiratory:   Gastroitestinal:   Genitourinary:  Hematology/Lymphatic:   Endocrine:  Musculoskeletal:   Allergy/Immunology:   Neurological:  Dizziness  Psychiatric:  Sleep:   The following represents the patient's updated allergies and side effects list: Allergies  Allergen Reactions  . Plavix [Clopidogrel Bisulfate] Other (See Comments)    PT STATES MED DID NOT MAKE HIM FEEL WELL  . Xarelto [Rivaroxaban] Other (See Comments)    bleeding    The neurologically relevant items on the patient's problem list were reviewed on today's visit.  Neurologic Examination  A problem focused neurological exam (12 or more points of the single system neurologic examination, vital signs counts as 1 point, cranial nerves count for 8 points) was performed.  Blood pressure 126/76, pulse 68, height 5\' 9"  (1.753 m), weight 161 lb (73 kg).  General - Well nourished, well developed, in no apparent distress.  Ophthalmologic - Fundi not visualized due to noncooperation.  Cardiovascular - irregularly irregular heart rate and rhythm.  Mental Status -  Level of arousal and orientation to time, place, and person were intact. Language including expression, naming, repetition, comprehension was assessed and found intact. Fund of Knowledge was assessed and was intact.  Cranial Nerves II - XII - II - Visual field intact OU. III, IV, VI - Extraocular movements intact. V - Facial sensation decreased on the right. VII - Facial movement intact bilaterally. VIII - Hard of hearing & vestibular  intact bilaterally. X - Palate elevates symmetrically. XI - Chin turning & shoulder shrug intact bilaterally. XII - Tongue protrusion intact.  Motor Strength - The patient's strength was normal in all extremities and pronator drift was absent.  Bulk was normal and fasciculations were absent.   Motor Tone - Muscle tone was assessed at the neck and appendages and was normal.  Reflexes - The patient's reflexes were 1+ in all extremities and he had no pathological reflexes.  Sensory - Light touch, temperature/pinprick were assessed and were decreased on the right.    Coordination - The patient had normal movements in the hands and feet with no ataxia or dysmetria.  Tremor was absent.  Gait and Station - walk with cane, slow and small stride but steady.   Data reviewed: I was not able to reviewed the radiological images below but obtained from documentation.  MRI: MRI at [facility] showed small infarcts in the subcortical white matter on the left.  CT HEAD: His CT of the head on 05/18/15 showed no acute stroke.  CAROTID DUPLEX: His carotid duplex at [facility] on 05/19/2015 showed a 70-99% left carotid stenosis.  I personally reviewed the images and agree with the radiology interpretations.  CEREBRAL ANGIOGRAM:   1.Approx 85 % stenosis of LT ICA prox. 2.Approx 90 % stenosis RT ICA cavernous seg with post stenotic dilatation  EKG - afib  Component     Latest Ref Rng 05/22/2015 05/23/2015  Cholesterol     0 - 200 mg/dL  132  Triglycerides     <150 mg/dL  60  HDL Cholesterol     >40 mg/dL  46  Total CHOL/HDL Ratio       2.9  VLDL     0 - 40 mg/dL  12  LDL (calc)     0 - 99 mg/dL  74  Hemoglobin A1C     4.8 - 5.6 % 5.9 (H)   Mean Plasma Glucose      123       Assessment: As you may recall, he is a 82 y.o. Caucasian male with PMH of afib, CAD, HLD, HTN, MI, CVA transferred from [facility] for left brain stroke and left ICA stenosis on 05/22/15. Symptoms  resolved. MRI found left subcortical WM infarcts. CUS left ICA 70-99% stenosis. Cerebral angio left ICA proximal 85% stenosis and right ICA cavernous 90% stenosis. TTE EF 55-60% and LDL 74 with A1C 5.9. VVS consulted and pt had left CEA done without complication. After CEA, pt was put on eliquis 2.5mg  bid because of his borderline weight, fluctuating Cre and fall hx of concurrent use of ASA. He was discharged with eliquis and ASA 81mg  as well as lipitor. During the interval time, the patient has been doing well. Followed with VVS and will repeat CUS next week. Currently, renal function normal and weight > 60kg, need to adjust eliquis dose. Developed lightheadedness on motion and tension HA since wife passed away two weeks ago, BP stable and no orthostatic hypotension, likely related to grief and depression.  Plan:  - will increase eliquis from 2.5mg  bid to 5mg  bid as Cre improved and weight > 60kg - continue ASA 81mg  and lipitor for stroke prevention - check BP and record and BP goal 130-150 due to right ICA cavernous 90% stenosis - follow up with cardiology in [place] for afib management - follow up with vascular surgery next week - Follow up with your primary care physician for stroke risk factor modification. Recommend maintain blood pressure goal around 130-150/80-90, diabetes with hemoglobin A1c goal below 7.0% and lipids with LDL cholesterol goal below 70 mg/dL.  - relaxation and avoid stress are the key - avoid fall, walk with cane or walker if needed. - follow up in 3 months.  I spent more than 25 minutes of face to face time with the patient. Greater than 50% of time was spent in counseling and coordination of care. We discussed eliquis dosage change, relaxation and cope with stress, and follow up with VVS.    No orders of the defined types were placed in this encounter.   Meds ordered this encounter  Medications  . cloNIDine (CATAPRES) 0.1 MG tablet  . anti-nausea (EMETROL)  solution    Sig: Take 10 mLs by mouth as needed for nausea or vomiting.  . Butalbital-Acetaminophen 50-300 MG TABS    Sig: Take by mouth.    Patient Instructions  - will increase eliquis from 2.5mg  twice a day to 5mg  twice a day as your kidney function improved and you gained weight - continue ASA 81mg  and lipitor for stroke prevention - check BP and record and BP goal 130-150 due to right ICA 90% stenosis - follow up with cardiology in [place] for afib management - follow up with vascular surgery next week - Follow up with your primary care physician for stroke risk factor modification. Recommend maintain blood pressure goal around 130-150/80-90, diabetes with hemoglobin A1c goal below 7.0% and lipids with LDL cholesterol goal below 70 mg/dL.  - relaxation and avoid stress are the key - avoid fall, walk with cane or walker if needed. - follow up in 3 months.   Rosalin Hawking, MD PhD [facility] [address], [place], [place] [address] [phone]

## 2015-12-18 ENCOUNTER — Encounter: Payer: Self-pay | Admitting: Family

## 2015-12-19 ENCOUNTER — Ambulatory Visit (HOSPITAL_COMMUNITY)
Admission: RE | Admit: 2015-12-19 | Discharge: 2015-12-19 | Disposition: A | Discharge status: Home / Self Care | Payer: PPO | Source: Ambulatory Visit | Attending: Family | Admitting: Family

## 2015-12-19 ENCOUNTER — Ambulatory Visit (INDEPENDENT_AMBULATORY_CARE_PROVIDER_SITE_OTHER): Payer: PPO | Admitting: Family

## 2015-12-19 ENCOUNTER — Encounter: Payer: Self-pay | Admitting: Family

## 2015-12-19 VITALS — BP 138/76 | HR 53 | Temp 97.1°F | Ht 69.0 in | Wt 162.0 lb

## 2015-12-19 DIAGNOSIS — Z48812 Encounter for surgical aftercare following surgery on the circulatory system: Secondary | ICD-10-CM | POA: Diagnosis not present

## 2015-12-19 DIAGNOSIS — Z9889 Other specified postprocedural states: Secondary | ICD-10-CM | POA: Diagnosis not present

## 2015-12-19 DIAGNOSIS — I6522 Occlusion and stenosis of left carotid artery: Secondary | ICD-10-CM

## 2015-12-19 DIAGNOSIS — I6521 Occlusion and stenosis of right carotid artery: Secondary | ICD-10-CM | POA: Diagnosis not present

## 2015-12-19 DIAGNOSIS — R0989 Other specified symptoms and signs involving the circulatory and respiratory systems: Secondary | ICD-10-CM | POA: Diagnosis not present

## 2015-12-19 LAB — VAS US CAROTID
LCCADDIAS: 27 cm/s
LCCADSYS: 95 cm/s
LCCAPDIAS: 26 cm/s
LEFT ECA DIAS: -13 cm/s
LEFT VERTEBRAL DIAS: 11 cm/s
LICADDIAS: -28 cm/s
LICAPDIAS: 28 cm/s
LICAPSYS: 116 cm/s
Left CCA prox sys: 108 cm/s
Left ICA dist sys: -85 cm/s
RCCAPDIAS: 9 cm/s
RCCAPSYS: 63 cm/s
RIGHT CCA MID DIAS: 12 cm/s
RIGHT ECA DIAS: -11 cm/s
RIGHT VERTEBRAL DIAS: -10 cm/s
Right cca dist sys: -56 cm/s

## 2015-12-19 NOTE — Progress Notes (Signed)
Chief Complaint: Follow up Extracranial Carotid Artery Stenosis   History of Present Illness  Dave Harris is a 82 y.o. male who was having repeated episodes of syncope. He was found to have a tight left carotid stenosis. Left carotid endarterectomy was recommended in order to lower his risk of future stroke. Of note he had a fairly large artery and for this reason the endarterectomy site was closed primarily. His surgery was on 05/24/2015, performed by Dr. Scot Dock. Of note, his preoperative cerebral arteriogram showed no significant stenosis on the right. He returns for routine follow up today.   Daughter with pt states he had several TIA's preoperatively, none since the left CEA. The TIA's manifested as speech difficulties and transient right hemiparesis, daughter denies that pt had any vision changes.  He denies any focal weakness or paresthesias. He has no problems swallowing. He has had no further problems with syncope.  He is on aspirin and is on a statin.  Daughter states pt has c/o his legs have been weak, pt is very hard of hearing.  Daughter states pt has no problems with falling.  Pt Diabetic: no Pt smoker: former smoker, quit in 1978 when he had his CABG, smoked x 30 years  Pt meds include: Statin : yes ASA: yes Other anticoagulants/antiplatelets: Eliquis for atrial fib   Past Medical History:  Diagnosis Date  . CAD (coronary artery disease)   . CHF (congestive heart failure) ([place])   . Chronic atrial fibrillation ([place])   . Complication of anesthesia    "sometimes I'm hard to wake up (05/23/2015)  . Depression   . Heart disease   . History of blood transfusion    "w/my heart OR"  . History of stomach ulcers "sever"  . Hyperlipidemia   . Hypertension   . Myocardial infarction   . Stroke [facility] [place]) several   "6 since June 2016; last one was 05/19/2015"; denies residual  (05/23/2015)    Social History Social History  Substance Use Topics  . Smoking status:  Former Smoker    Packs/day: 2.00    Years: 30.00    Types: Cigarettes  . Smokeless tobacco: Never Used     Comment: "quit smoking cigarettes in 1978 when I had my heart surgery"  . Alcohol use No    Family History Family History  Problem Relation Age of Onset  . CAD    . Heart disease Brother     before age 67  . Heart attack Brother     Surgical History Past Surgical History:  Procedure Laterality Date  . BACK SURGERY    . CARDIAC CATHETERIZATION    . CARPAL TUNNEL RELEASE Bilateral   . CATARACT EXTRACTION W/ INTRAOCULAR LENS  IMPLANT, BILATERAL Bilateral   . COLON SURGERY    . CORONARY ARTERY BYPASS GRAFT  1978   CABG X"?3"  . ENDARTERECTOMY Left 05/24/2015   Procedure: Left ENDARTERECTOMY CAROTID;  Surgeon: Angelia Mould, MD;  Location: [facility];  Service: Vascular;  Laterality: Left;  . LUMBAR DISC SURGERY     "had 4 ruptured discs"  . MULTIPLE TOOTH EXTRACTIONS    . STOMACH SURGERY     "had 1/2 my stomach removed; had several stomach ulcers"  . TONSILLECTOMY AND ADENOIDECTOMY  ~ 1947  . TRANSURETHRAL RESECTION OF PROSTATE  X 3   "never removed it" (05/22/2015)    Allergies  Allergen Reactions  . Plavix [Clopidogrel Bisulfate] Other (See Comments)    PT STATES MED DID NOT  MAKE HIM FEEL WELL  . Xarelto [Rivaroxaban] Other (See Comments)    bleeding    Current Outpatient Prescriptions  Medication Sig Dispense Refill  . AMITIZA 8 MCG capsule     . anti-nausea (EMETROL) solution Take 10 mLs by mouth as needed for nausea or vomiting.    Marland Kitchen apixaban (ELIQUIS) 2.5 MG TABS tablet Take 1 tablet (2.5 mg total) by mouth 2 (two) times daily. 60 tablet 1  . aspirin 81 MG tablet Take 81 mg by mouth daily.    Marland Kitchen atorvastatin (LIPITOR) 40 MG tablet Take 40 mg by mouth.    . bisacodyl (DULCOLAX) 10 MG suppository Place 1 suppository (10 mg total) rectally daily as needed for moderate constipation. 12 suppository 0  . Butalbital-Acetaminophen 50-300 MG TABS Take by mouth.     . cloNIDine (CATAPRES) 0.1 MG tablet     . docusate sodium (COLACE) 100 MG capsule Take 100 mg by mouth.    . fexofenadine (ALLEGRA) 180 MG tablet Take 180 mg by mouth daily.    . finasteride (PROSCAR) 5 MG tablet Take 5 mg by mouth daily.    Marland Kitchen HYDROcodone-acetaminophen (NORCO/VICODIN) 5-325 MG tablet Take by mouth.    Marland Kitchen LINZESS 145 MCG CAPS capsule     . magnesium hydroxide (MILK OF MAGNESIA) 400 MG/5ML suspension Take by mouth daily as needed for mild constipation.    . mirtazapine (REMERON) 30 MG tablet Take 30 mg by mouth.    . Multiple Vitamins-Minerals (PRESERVISION AREDS PO) Take 1 tablet by mouth every morning.    . nitroGLYCERIN (NITROSTAT) 0.4 MG SL tablet Place 0.4 mg under the tongue.    . pantoprazole (PROTONIX) 40 MG tablet Take 1 tablet (40 mg total) by mouth daily. 30 tablet 1  . vitamin B-12 (CYANOCOBALAMIN) 1000 MCG tablet Take 2,000 mcg by mouth daily.     No current facility-administered medications for this visit.     Review of Systems : See HPI for pertinent positives and negatives.  Physical Examination  Vitals:   12/19/15 0955  BP: (!) 148/76  Pulse: 64  Temp: 97.1 F (36.2 C)  SpO2: 98%  Weight: 162 lb (73.5 kg)  Height: 5\' 9"  (1.753 m)   Body mass index is 23.92 kg/m.  General: WDWN elderly male in NAD GAIT: slow and deliberate, using cane Eyes: PERRLA Pulmonary:  Respirations are non-labored, good air movement, CTAB  Cardiac: Irregular rhythm, no detected murmur.  VASCULAR EXAM Carotid Bruits Right Left   Negative Negative    Aorta is not palpable. Radial pulses are 2+ palpable and equal.                                                                                                                            LE Pulses Right Left       FEMORAL  2+ palpable  2+ palpable        POPLITEAL  2+ palpable   1+ palpable  POSTERIOR TIBIAL   1+palpable   1+ palpable        DORSALIS PEDIS      ANTERIOR TIBIAL  2+palpable  2+ palpable      Gastrointestinal: soft, nontender, BS WNL, no r/g,  no palpable masses.  Musculoskeletal: Age approriate muscle atrophy/wasting. M/S 5/5 throughout, extremities without ischemic changes.  Neurologic: A&O X 3; Appropriate Affect, Speech is normal, edentulous, CN 2-12 intact except is very hard of hearing, pain and light touch intact in extremities, Motor exam as listed above.     Assessment: Dave Harris is a 82 y.o. male who is s/p left carotid endarterectomy on 05/24/2015. He had several TIA's preoperatively, none since the left CEA. The TIA's manifested as speech difficulties and transient right hemiparesis.  He has a prominent right popliteal pulse; will include bilateral popliteal duplex on his return.   His atherosclerotic risk factors include smoker x 30 years until 1978, CAD, and atrial fib. He takes Eliquis, ASA, and a statin. Fortunately he does not have DM.   DATA Today's carotid duplex suggests no significant stenosis of the bilateral ECA or CCA.  Right ICA with <40% stenosis. Left ICA, CEA site, with no restenosis, some hyperplasia. No prior exam at this facility since CEA performed on 05/24/15.    Plan: Follow-up in 6 months with Carotid Duplex scan and bilateral popliteal arterial duplex   I discussed in depth with the patient the nature of atherosclerosis, and emphasized the importance of maximal medical management including strict control of blood pressure, blood glucose, and lipid levels, obtaining regular exercise, and continued cessation of smoking.  The patient is aware that without maximal medical management the underlying atherosclerotic disease process will progress, limiting the benefit of any interventions. The patient was given information about stroke prevention and what symptoms should prompt the patient to seek immediate medical care. Thank you for allowing [place] to participate in this patient's care.  Clemon Chambers, RN, MSN, FNP-C [facility] Office: [phone]  Clinic Physician: Scot Dock  12/19/15 9:58 AM

## 2015-12-19 NOTE — Patient Instructions (Signed)
Stroke Prevention Some medical conditions and behaviors are associated with an increased chance of having a stroke. You may prevent a stroke by making healthy choices and managing medical conditions. HOW CAN I REDUCE MY RISK OF HAVING A STROKE?   Stay physically active. Get at least 30 minutes of activity on most or all days.  Do not smoke. It may also be helpful to avoid exposure to secondhand smoke.  Limit alcohol use. Moderate alcohol use is considered to be:  No more than 2 drinks per day for men.  No more than 1 drink per day for nonpregnant women.  Eat healthy foods. This involves:  Eating 5 or more servings of fruits and vegetables a day.  Making dietary changes that address high blood pressure (hypertension), high cholesterol, diabetes, or obesity.  Manage your cholesterol levels.  Making food choices that are high in fiber and low in saturated fat, trans fat, and cholesterol may control cholesterol levels.  Take any prescribed medicines to control cholesterol as directed by your health care provider.  Manage your diabetes.  Controlling your carbohydrate and sugar intake is recommended to manage diabetes.  Take any prescribed medicines to control diabetes as directed by your health care provider.  Control your hypertension.  Making food choices that are low in salt (sodium), saturated fat, trans fat, and cholesterol is recommended to manage hypertension.  Ask your health care provider if you need treatment to lower your blood pressure. Take any prescribed medicines to control hypertension as directed by your health care provider.  If you are 18-39 years of age, have your blood pressure checked every 3-5 years. If you are 40 years of age or older, have your blood pressure checked every year.  Maintain a healthy weight.  Reducing calorie intake and making food choices that are low in sodium, saturated fat, trans fat, and cholesterol are recommended to manage  weight.  Stop drug abuse.  Avoid taking birth control pills.  Talk to your health care provider about the risks of taking birth control pills if you are over 35 years old, smoke, get migraines, or have ever had a blood clot.  Get evaluated for sleep disorders (sleep apnea).  Talk to your health care provider about getting a sleep evaluation if you snore a lot or have excessive sleepiness.  Take medicines only as directed by your health care provider.  For some people, aspirin or blood thinners (anticoagulants) are helpful in reducing the risk of forming abnormal blood clots that can lead to stroke. If you have the irregular heart rhythm of atrial fibrillation, you should be on a blood thinner unless there is a good reason you cannot take them.  Understand all your medicine instructions.  Make sure that other conditions (such as anemia or atherosclerosis) are addressed. SEEK IMMEDIATE MEDICAL CARE IF:   You have sudden weakness or numbness of the face, arm, or leg, especially on one side of the body.  Your face or eyelid droops to one side.  You have sudden confusion.  You have trouble speaking (aphasia) or understanding.  You have sudden trouble seeing in one or both eyes.  You have sudden trouble walking.  You have dizziness.  You have a loss of balance or coordination.  You have a sudden, severe headache with no known cause.  You have new chest pain or an irregular heartbeat. Any of these symptoms may represent a serious problem that is an emergency. Do not wait to see if the symptoms will   go away. Get medical help at once. Call your local emergency services (911 in [place] Do not drive yourself to the hospital.   This information is not intended to replace advice given to you by your health care provider. Make sure you discuss any questions you have with your health care provider.   Document Released: 03/06/2004 Document Revised: 02/17/2014 Document Reviewed:  07/30/2012 Elsevier Interactive Patient Education 2016 [employer].  

## 2015-12-25 ENCOUNTER — Telehealth (HOSPITAL_COMMUNITY): Payer: Self-pay

## 2015-12-25 NOTE — Telephone Encounter (Signed)
Called to reschedule consult, left message for pt's daughter to return call. AW

## 2015-12-26 ENCOUNTER — Encounter (HOSPITAL_COMMUNITY): Payer: Self-pay

## 2015-12-26 ENCOUNTER — Ambulatory Visit (HOSPITAL_COMMUNITY): Payer: PPO | Attending: Interventional Radiology

## 2016-01-01 NOTE — Addendum Note (Signed)
Addended by: Lianne Cure A on: 01/01/2016 10:32 AM   Modules accepted: Orders

## 2016-01-14 DIAGNOSIS — I10 Essential (primary) hypertension: Secondary | ICD-10-CM | POA: Diagnosis not present

## 2016-01-30 DIAGNOSIS — I4891 Unspecified atrial fibrillation: Secondary | ICD-10-CM | POA: Diagnosis not present

## 2016-01-30 DIAGNOSIS — J189 Pneumonia, unspecified organism: Secondary | ICD-10-CM | POA: Diagnosis not present

## 2016-01-30 DIAGNOSIS — R51 Headache: Secondary | ICD-10-CM | POA: Diagnosis not present

## 2016-01-30 DIAGNOSIS — G4489 Other headache syndrome: Secondary | ICD-10-CM | POA: Diagnosis not present

## 2016-01-30 DIAGNOSIS — R109 Unspecified abdominal pain: Secondary | ICD-10-CM | POA: Diagnosis not present

## 2016-01-30 DIAGNOSIS — I509 Heart failure, unspecified: Secondary | ICD-10-CM | POA: Diagnosis not present

## 2016-01-30 DIAGNOSIS — R079 Chest pain, unspecified: Secondary | ICD-10-CM | POA: Diagnosis not present

## 2016-01-30 DIAGNOSIS — R509 Fever, unspecified: Secondary | ICD-10-CM | POA: Diagnosis not present

## 2016-02-13 DIAGNOSIS — J189 Pneumonia, unspecified organism: Secondary | ICD-10-CM | POA: Diagnosis not present

## 2016-02-27 DIAGNOSIS — J101 Influenza due to other identified influenza virus with other respiratory manifestations: Secondary | ICD-10-CM | POA: Diagnosis not present

## 2016-03-17 ENCOUNTER — Ambulatory Visit: Payer: PPO | Admitting: Neurology

## 2016-03-17 ENCOUNTER — Telehealth: Payer: Self-pay | Admitting: *Deleted

## 2016-03-17 NOTE — Telephone Encounter (Signed)
Patient canceled his appt same day due to sickness.

## 2016-03-18 ENCOUNTER — Encounter: Payer: Self-pay | Admitting: Neurology

## 2016-04-08 DIAGNOSIS — R05 Cough: Secondary | ICD-10-CM | POA: Diagnosis not present

## 2016-04-08 DIAGNOSIS — J309 Allergic rhinitis, unspecified: Secondary | ICD-10-CM | POA: Diagnosis not present

## 2016-04-08 DIAGNOSIS — Z6821 Body mass index (BMI) 21.0-21.9, adult: Secondary | ICD-10-CM | POA: Diagnosis not present

## 2016-04-08 DIAGNOSIS — I129 Hypertensive chronic kidney disease with stage 1 through stage 4 chronic kidney disease, or unspecified chronic kidney disease: Secondary | ICD-10-CM | POA: Diagnosis not present

## 2016-04-08 DIAGNOSIS — N183 Chronic kidney disease, stage 3 (moderate): Secondary | ICD-10-CM | POA: Diagnosis not present

## 2016-04-08 DIAGNOSIS — I2510 Atherosclerotic heart disease of native coronary artery without angina pectoris: Secondary | ICD-10-CM | POA: Diagnosis not present

## 2016-04-08 DIAGNOSIS — I4891 Unspecified atrial fibrillation: Secondary | ICD-10-CM | POA: Diagnosis not present

## 2016-04-10 DIAGNOSIS — I959 Hypotension, unspecified: Secondary | ICD-10-CM | POA: Diagnosis not present

## 2016-04-10 DIAGNOSIS — R54 Age-related physical debility: Secondary | ICD-10-CM | POA: Diagnosis not present

## 2016-04-10 DIAGNOSIS — R1030 Lower abdominal pain, unspecified: Secondary | ICD-10-CM | POA: Diagnosis not present

## 2016-04-10 DIAGNOSIS — R5381 Other malaise: Secondary | ICD-10-CM | POA: Diagnosis not present

## 2016-04-10 DIAGNOSIS — K5900 Constipation, unspecified: Secondary | ICD-10-CM | POA: Diagnosis not present

## 2016-04-11 DIAGNOSIS — N200 Calculus of kidney: Secondary | ICD-10-CM | POA: Diagnosis not present

## 2016-04-11 DIAGNOSIS — R300 Dysuria: Secondary | ICD-10-CM | POA: Diagnosis not present

## 2016-04-11 DIAGNOSIS — R109 Unspecified abdominal pain: Secondary | ICD-10-CM | POA: Diagnosis not present

## 2016-04-21 DIAGNOSIS — R262 Difficulty in walking, not elsewhere classified: Secondary | ICD-10-CM | POA: Diagnosis not present

## 2016-04-21 DIAGNOSIS — I739 Peripheral vascular disease, unspecified: Secondary | ICD-10-CM | POA: Diagnosis not present

## 2016-04-21 DIAGNOSIS — B351 Tinea unguium: Secondary | ICD-10-CM | POA: Diagnosis not present

## 2016-04-21 DIAGNOSIS — I70203 Unspecified atherosclerosis of native arteries of extremities, bilateral legs: Secondary | ICD-10-CM | POA: Diagnosis not present

## 2016-04-23 DIAGNOSIS — R0989 Other specified symptoms and signs involving the circulatory and respiratory systems: Secondary | ICD-10-CM | POA: Diagnosis not present

## 2016-04-23 DIAGNOSIS — D649 Anemia, unspecified: Secondary | ICD-10-CM | POA: Diagnosis not present

## 2016-04-23 DIAGNOSIS — I4891 Unspecified atrial fibrillation: Secondary | ICD-10-CM | POA: Diagnosis not present

## 2016-04-23 DIAGNOSIS — R05 Cough: Secondary | ICD-10-CM | POA: Diagnosis not present

## 2016-04-23 DIAGNOSIS — I10 Essential (primary) hypertension: Secondary | ICD-10-CM | POA: Diagnosis not present

## 2016-04-23 DIAGNOSIS — R7989 Other specified abnormal findings of blood chemistry: Secondary | ICD-10-CM | POA: Diagnosis not present

## 2016-04-23 DIAGNOSIS — I509 Heart failure, unspecified: Secondary | ICD-10-CM | POA: Diagnosis not present

## 2016-04-23 DIAGNOSIS — I252 Old myocardial infarction: Secondary | ICD-10-CM | POA: Diagnosis not present

## 2016-04-23 DIAGNOSIS — R001 Bradycardia, unspecified: Secondary | ICD-10-CM | POA: Diagnosis not present

## 2016-05-07 DIAGNOSIS — R42 Dizziness and giddiness: Secondary | ICD-10-CM | POA: Diagnosis not present

## 2016-05-07 DIAGNOSIS — I10 Essential (primary) hypertension: Secondary | ICD-10-CM | POA: Diagnosis not present

## 2016-05-07 DIAGNOSIS — I2510 Atherosclerotic heart disease of native coronary artery without angina pectoris: Secondary | ICD-10-CM | POA: Diagnosis not present

## 2016-05-07 DIAGNOSIS — I6529 Occlusion and stenosis of unspecified carotid artery: Secondary | ICD-10-CM | POA: Diagnosis not present

## 2016-06-20 ENCOUNTER — Encounter (HOSPITAL_COMMUNITY): Payer: PPO

## 2016-06-20 ENCOUNTER — Ambulatory Visit: Payer: PPO | Admitting: Family

## 2016-06-23 ENCOUNTER — Ambulatory Visit: Payer: PPO | Admitting: Neurology

## 2016-06-27 ENCOUNTER — Encounter: Payer: Self-pay | Admitting: Family

## 2016-07-02 DIAGNOSIS — R05 Cough: Secondary | ICD-10-CM | POA: Diagnosis not present

## 2016-07-02 DIAGNOSIS — R600 Localized edema: Secondary | ICD-10-CM | POA: Diagnosis not present

## 2016-07-02 DIAGNOSIS — R0981 Nasal congestion: Secondary | ICD-10-CM | POA: Diagnosis not present

## 2016-07-04 ENCOUNTER — Ambulatory Visit: Payer: PPO | Admitting: Family

## 2016-07-04 ENCOUNTER — Ambulatory Visit (HOSPITAL_COMMUNITY): Payer: PPO

## 2016-07-04 DIAGNOSIS — R05 Cough: Secondary | ICD-10-CM | POA: Diagnosis not present

## 2016-07-04 DIAGNOSIS — R0989 Other specified symptoms and signs involving the circulatory and respiratory systems: Secondary | ICD-10-CM | POA: Diagnosis not present

## 2016-07-15 DIAGNOSIS — R0989 Other specified symptoms and signs involving the circulatory and respiratory systems: Secondary | ICD-10-CM | POA: Diagnosis not present

## 2016-07-15 DIAGNOSIS — R05 Cough: Secondary | ICD-10-CM | POA: Diagnosis not present

## 2016-07-15 DIAGNOSIS — R54 Age-related physical debility: Secondary | ICD-10-CM | POA: Diagnosis not present

## 2016-07-15 DIAGNOSIS — R5381 Other malaise: Secondary | ICD-10-CM | POA: Diagnosis not present

## 2016-07-17 DIAGNOSIS — J189 Pneumonia, unspecified organism: Secondary | ICD-10-CM | POA: Diagnosis not present

## 2016-07-19 DIAGNOSIS — R0989 Other specified symptoms and signs involving the circulatory and respiratory systems: Secondary | ICD-10-CM | POA: Diagnosis not present

## 2016-07-19 DIAGNOSIS — I482 Chronic atrial fibrillation: Secondary | ICD-10-CM | POA: Diagnosis not present

## 2016-07-19 DIAGNOSIS — R531 Weakness: Secondary | ICD-10-CM | POA: Diagnosis not present

## 2016-07-19 DIAGNOSIS — R404 Transient alteration of awareness: Secondary | ICD-10-CM | POA: Diagnosis not present

## 2016-07-23 DIAGNOSIS — W19XXXA Unspecified fall, initial encounter: Secondary | ICD-10-CM | POA: Diagnosis not present

## 2016-07-23 DIAGNOSIS — M6281 Muscle weakness (generalized): Secondary | ICD-10-CM | POA: Diagnosis not present

## 2016-07-23 DIAGNOSIS — I6529 Occlusion and stenosis of unspecified carotid artery: Secondary | ICD-10-CM | POA: Diagnosis not present

## 2016-07-23 DIAGNOSIS — R55 Syncope and collapse: Secondary | ICD-10-CM | POA: Diagnosis not present

## 2016-07-29 DIAGNOSIS — I4891 Unspecified atrial fibrillation: Secondary | ICD-10-CM | POA: Diagnosis not present

## 2016-07-29 DIAGNOSIS — I129 Hypertensive chronic kidney disease with stage 1 through stage 4 chronic kidney disease, or unspecified chronic kidney disease: Secondary | ICD-10-CM | POA: Diagnosis not present

## 2016-07-29 DIAGNOSIS — N183 Chronic kidney disease, stage 3 (moderate): Secondary | ICD-10-CM | POA: Diagnosis not present

## 2016-07-29 DIAGNOSIS — M25562 Pain in left knee: Secondary | ICD-10-CM | POA: Diagnosis not present

## 2016-07-29 DIAGNOSIS — I2510 Atherosclerotic heart disease of native coronary artery without angina pectoris: Secondary | ICD-10-CM | POA: Diagnosis not present

## 2016-07-29 DIAGNOSIS — W19XXXA Unspecified fall, initial encounter: Secondary | ICD-10-CM | POA: Diagnosis not present

## 2016-08-11 ENCOUNTER — Encounter: Payer: Self-pay | Admitting: Family

## 2016-08-22 ENCOUNTER — Ambulatory Visit (INDEPENDENT_AMBULATORY_CARE_PROVIDER_SITE_OTHER): Payer: PPO | Admitting: Family

## 2016-08-22 ENCOUNTER — Ambulatory Visit (HOSPITAL_COMMUNITY)
Admission: RE | Admit: 2016-08-22 | Discharge: 2016-08-22 | Disposition: A | Discharge status: Home / Self Care | Payer: PPO | Source: Ambulatory Visit | Attending: Vascular Surgery | Admitting: Vascular Surgery

## 2016-08-22 ENCOUNTER — Encounter: Payer: Self-pay | Admitting: Family

## 2016-08-22 VITALS — BP 162/73 | HR 80 | Temp 97.1°F | Resp 20 | Ht 69.0 in | Wt 155.0 lb

## 2016-08-22 DIAGNOSIS — Z9889 Other specified postprocedural states: Secondary | ICD-10-CM

## 2016-08-22 DIAGNOSIS — I6522 Occlusion and stenosis of left carotid artery: Secondary | ICD-10-CM | POA: Diagnosis not present

## 2016-08-22 DIAGNOSIS — R0989 Other specified symptoms and signs involving the circulatory and respiratory systems: Secondary | ICD-10-CM

## 2016-08-22 DIAGNOSIS — I6523 Occlusion and stenosis of bilateral carotid arteries: Secondary | ICD-10-CM | POA: Insufficient documentation

## 2016-08-22 LAB — VAS US CAROTID
LCCADDIAS: -28 cm/s
LCCADSYS: -116 cm/s
LEFT ECA DIAS: -10 cm/s
LEFT VERTEBRAL DIAS: 16 cm/s
LICADDIAS: -40 cm/s
LICADSYS: -142 cm/s
LICAPDIAS: -36 cm/s
LICAPSYS: -127 cm/s
Left CCA prox dias: 23 cm/s
Left CCA prox sys: 99 cm/s
RCCAPDIAS: 10 cm/s
RIGHT CCA MID DIAS: 10 cm/s
RIGHT ECA DIAS: 0 cm/s
RIGHT VERTEBRAL DIAS: -16 cm/s
Right CCA prox sys: 58 cm/s
Right cca dist sys: -73 cm/s

## 2016-08-22 NOTE — Patient Instructions (Signed)
Stroke Prevention Some medical conditions and behaviors are associated with an increased chance of having a stroke. You may prevent a stroke by making healthy choices and managing medical conditions. How can I reduce my risk of having a stroke?  Stay physically active. Get at least 30 minutes of activity on most or all days.  Do not smoke. It may also be helpful to avoid exposure to secondhand smoke.  Limit alcohol use. Moderate alcohol use is considered to be: ? No more than 2 drinks per day for men. ? No more than 1 drink per day for nonpregnant women.  Eat healthy foods. This involves: ? Eating 5 or more servings of fruits and vegetables a day. ? Making dietary changes that address high blood pressure (hypertension), high cholesterol, diabetes, or obesity.  Manage your cholesterol levels. ? Making food choices that are high in fiber and low in saturated fat, trans fat, and cholesterol may control cholesterol levels. ? Take any prescribed medicines to control cholesterol as directed by your health care provider.  Manage your diabetes. ? Controlling your carbohydrate and sugar intake is recommended to manage diabetes. ? Take any prescribed medicines to control diabetes as directed by your health care provider.  Control your hypertension. ? Making food choices that are low in salt (sodium), saturated fat, trans fat, and cholesterol is recommended to manage hypertension. ? Ask your health care provider if you need treatment to lower your blood pressure. Take any prescribed medicines to control hypertension as directed by your health care provider. ? If you are 18-39 years of age, have your blood pressure checked every 3-5 years. If you are 40 years of age or older, have your blood pressure checked every year.  Maintain a healthy weight. ? Reducing calorie intake and making food choices that are low in sodium, saturated fat, trans fat, and cholesterol are recommended to manage  weight.  Stop drug abuse.  Avoid taking birth control pills. ? Talk to your health care provider about the risks of taking birth control pills if you are over 35 years old, smoke, get migraines, or have ever had a blood clot.  Get evaluated for sleep disorders (sleep apnea). ? Talk to your health care provider about getting a sleep evaluation if you snore a lot or have excessive sleepiness.  Take medicines only as directed by your health care provider. ? For some people, aspirin or blood thinners (anticoagulants) are helpful in reducing the risk of forming abnormal blood clots that can lead to stroke. If you have the irregular heart rhythm of atrial fibrillation, you should be on a blood thinner unless there is a good reason you cannot take them. ? Understand all your medicine instructions.  Make sure that other conditions (such as anemia or atherosclerosis) are addressed. Get help right away if:  You have sudden weakness or numbness of the face, arm, or leg, especially on one side of the body.  Your face or eyelid droops to one side.  You have sudden confusion.  You have trouble speaking (aphasia) or understanding.  You have sudden trouble seeing in one or both eyes.  You have sudden trouble walking.  You have dizziness.  You have a loss of balance or coordination.  You have a sudden, severe headache with no known cause.  You have new chest pain or an irregular heartbeat. Any of these symptoms may represent a serious problem that is an emergency. Do not wait to see if the symptoms will go away.   Get medical help at once. Call your local emergency services (911 in [place] Do not drive yourself to the hospital. This information is not intended to replace advice given to you by your health care provider. Make sure you discuss any questions you have with your health care provider. Document Released: 03/06/2004 Document Revised: 07/05/2015 Document Reviewed: 07/30/2012 Elsevier  Interactive Patient Education  2017 Elsevier Inc.     Preventing Cerebrovascular Disease Arteries are blood vessels that carry blood that contains oxygen from the heart to all parts of the body. Cerebrovascular disease affects arteries that supply the brain. Any condition that blocks or disrupts blood flow to the brain can cause cerebrovascular disease. Brain cells that lose blood supply start to die within minutes (stroke). Stroke is the main danger of cerebrovascular disease. Atherosclerosis and high blood pressure are common causes of cerebrovascular disease. Atherosclerosis is narrowing and hardening of an artery that results when fat, cholesterol, calcium, or other substances (plaque) build up inside an artery. Plaque reduces blood flow through the artery. High blood pressure increases the risk of bleeding inside the brain. Making diet and lifestyle changes to prevent atherosclerosis and high blood pressure lowers your risk of cerebrovascular disease. What nutrition changes can be made?  Eat more fruits, vegetables, and whole grains.  Reduce how much saturated fat you eat. To do this, eat less red meat and fewer full-fat dairy products.  Eat healthy proteins instead of red meat. Healthy proteins include: ? Fish. Eat fish that contains heart-healthy omega-3 fatty acids, twice a week. Examples include salmon, albacore tuna, mackerel, and herring. ? Chicken. ? Nuts. ? Low-fat or nonfat yogurt.  Avoid processed meats, like bacon and lunchmeat.  Avoid foods that contain: ? A lot of sugar, such as sweets and drinks with added sugar. ? A lot of salt (sodium). Avoid adding extra salt to your food, as told by your health care provider. ? Trans fats, such as margarine and baked goods. Trans fats may be listed as "partially hydrogenated oils" on food labels.  Check food labels to see how much sodium, sugar, and trans fats are in foods.  Use vegetable oils that contain low amounts of  saturated fat, such as olive oil or canola oil. What lifestyle changes can be made?  Drink alcohol in moderation. This means no more than 1 drink a day for nonpregnant women and 2 drinks a day for men. One drink equals 12 oz of beer, 5 oz of wine, or 1 oz of hard liquor.  If you are overweight, ask your health care provider to recommend a weight-loss plan for you. Losing 5-10 lb (2.2-4.5 kg) can reduce your risk of diabetes, atherosclerosis, and high blood pressure.  Exercise for 30?60 minutes on most days, or as much as told by your health care provider. ? Do moderate-intensity exercise, such as brisk walking, bicycling, and water aerobics. Ask your health care provider which activities are safe for you.  Do not use any products that contain nicotine or tobacco, such as cigarettes and e-cigarettes. If you need help quitting, ask your health care provider. Why are these changes important? Making these changes lowers your risk of many diseases that can cause cerebrovascular disease and stroke. Stroke is a leading cause of death and disability. Making these changes also improves your overall health and quality of life. What can I do to lower my risk? The following factors make you more likely to develop cerebrovascular disease:  Being overweight.  Smoking.  Being physically inactive.    Eating a high-fat diet.  Having certain health conditions, such as: ? Diabetes. ? High blood pressure. ? Heart disease. ? Atherosclerosis. ? High cholesterol. ? Sickle cell disease.  Talk with your health care provider about your risk for cerebrovascular disease. Work with your health care provider to control diseases that you have that may contribute to cerebrovascular disease. Your health care provider may prescribe medicines to help prevent major causes of cerebrovascular disease. Where to find more information: Learn more about preventing cerebrovascular disease from:  National Heart, Lung, and  Blood Institute: www.nhlbi.nih.gov/health/health-topics/topics/stroke  Centers for Disease Control and Prevention: cdc.gov/stroke/about.htm  Summary  Cerebrovascular disease can lead to a stroke.  Atherosclerosis and high blood pressure are major causes of cerebrovascular disease.  Making diet and lifestyle changes can reduce your risk of cerebrovascular disease.  Work with your health care provider to get your risk factors under control to reduce your risk of cerebrovascular disease. This information is not intended to replace advice given to you by your health care provider. Make sure you discuss any questions you have with your health care provider. Document Released: 02/11/2015 Document Revised: 08/17/2015 Document Reviewed: 02/11/2015 Elsevier Interactive Patient Education  2018 Elsevier Inc.  

## 2016-08-22 NOTE — Progress Notes (Signed)
Chief Complaint: Follow up Extracranial Carotid Artery Stenosis   History of Present Illness  Dave Harris is a 83 y.o. male who was having repeated episodes of syncope. He was found to have a tight left carotid stenosis. Left carotid endarterectomy was recommended in order to lower his risk of future stroke. Of note he had a fairly large artery and for this reason the endarterectomy site was closed primarily. His surgery was on 05/24/2015, performed by Dr. Scot Dock. Of note, his preoperative cerebral arteriogram showed no significant stenosis on the right. He returns for routine follow up today.   Daughter with pt at a previous visit stated he had several TIA's preoperatively, none since the left CEA. The TIA's manifested as speech difficulties and transient right hemiparesis, daughter denied that pt had any vision changes. Daughter stated pt has c/o his legs being weak, pt is very hard of hearing.  Pt lives in [facility].   He denies any focal weakness or paresthesias. He has no problems swallowing.   He fell 3 weeks ago, states he has had several episodes of feeling syncopal for a long time. He states he falls and feels like he is going to fall only when he is standing.   He is on aspirin and is on a statin.  Pt denies shortness of breath, he has moist sounding respirations, no cough.   He sees Dr. Erlinda Hong, neurologist.    Pt Diabetic: no Pt smoker: former smoker, quit in 1978 when he had his CABG, smoked x 30 years  Pt meds include: Statin : yes ASA: yes Other anticoagulants/antiplatelets: Eliquis for atrial fib   Past Medical History:  Diagnosis Date  . CAD (coronary artery disease)   . CHF (congestive heart failure) ([place])   . Chronic atrial fibrillation ([place])   . Complication of anesthesia    "sometimes I'm hard to wake up (05/23/2015)  . Depression   . Heart disease   . History of blood transfusion    "w/my heart OR"  . History of  stomach ulcers "sever"  . Hyperlipidemia   . Hypertension   . Myocardial infarction ([place])   . Stroke [facility]) several   "6 since June 2016; last one was 05/19/2015"; denies residual  (05/23/2015)    Social History Social History  Substance Use Topics  . Smoking status: Former Smoker    Packs/day: 2.00    Years: 30.00    Types: Cigarettes  . Smokeless tobacco: Never Used     Comment: "quit smoking cigarettes in 1978 when I had my heart surgery"  . Alcohol use No    Family History Family History  Problem Relation Age of Onset  . CAD Unknown   . Heart disease Brother        before age 9  . Heart attack Brother     Surgical History Past Surgical History:  Procedure Laterality Date  . BACK SURGERY    . CARDIAC CATHETERIZATION    . CARPAL TUNNEL RELEASE Bilateral   . CATARACT EXTRACTION W/ INTRAOCULAR LENS  IMPLANT, BILATERAL Bilateral   . COLON SURGERY    . CORONARY ARTERY BYPASS GRAFT  1978   CABG X"?3"  . ENDARTERECTOMY Left 05/24/2015   Procedure: Left ENDARTERECTOMY CAROTID;  Surgeon: Angelia Mould, MD;  Location: [place];  Service: Vascular;  Laterality: Left;  . LUMBAR DISC SURGERY     "had 4 ruptured discs"  . MULTIPLE TOOTH EXTRACTIONS    . STOMACH SURGERY     "  had 1/2 my stomach removed; had several stomach ulcers"  . TONSILLECTOMY AND ADENOIDECTOMY  ~ 1947  . TRANSURETHRAL RESECTION OF PROSTATE  X 3   "never removed it" (05/22/2015)    Allergies  Allergen Reactions  . Plavix [Clopidogrel Bisulfate] Other (See Comments)    PT STATES MED DID NOT MAKE HIM FEEL WELL  . Xarelto [Rivaroxaban] Other (See Comments)    bleeding    Current Outpatient Prescriptions  Medication Sig Dispense Refill  . Acetaminophen (APAP) 325 MG tablet Take 650 mg by mouth every 6 (six) hours as needed for pain.    Marland Kitchen anti-nausea (EMETROL) solution Take 10 mLs by mouth as needed for nausea or vomiting.    Marland Kitchen apixaban (ELIQUIS) 5 MG TABS tablet Take 5 mg by mouth 2 (two) times  daily.    Marland Kitchen aspirin 81 MG tablet Take 81 mg by mouth daily.    Marland Kitchen atorvastatin (LIPITOR) 40 MG tablet Take 40 mg by mouth.    . bisacodyl (DULCOLAX) 10 MG suppository Place 1 suppository (10 mg total) rectally daily as needed for moderate constipation. 12 suppository 0  . carvedilol (COREG) 6.25 MG tablet Take 6.25 mg by mouth 2 (two) times daily with a meal.    . cloNIDine (CATAPRES) 0.1 MG tablet     . Dextromethorphan-Guaifenesin (ROBITUSSIN DM) 10-100 MG/5ML liquid Take 5 mLs by mouth every 12 (twelve) hours.    . docusate sodium (COLACE) 100 MG capsule Take 100 mg by mouth.    . fexofenadine (ALLEGRA) 180 MG tablet Take 180 mg by mouth daily.    . finasteride (PROSCAR) 5 MG tablet Take 5 mg by mouth daily.    Marland Kitchen HYDROcodone-acetaminophen (NORCO/VICODIN) 5-325 MG tablet Take by mouth.    Marland Kitchen LINZESS 145 MCG CAPS capsule     . loperamide (IMODIUM A-D) 2 MG capsule Take by mouth as needed for diarrhea or loose stools.    . mirtazapine (REMERON) 30 MG tablet Take 30 mg by mouth.    . Multiple Vitamins-Minerals (PRESERVISION AREDS PO) Take 1 tablet by mouth every morning.    . nitroGLYCERIN (NITROSTAT) 0.4 MG SL tablet Place 0.4 mg under the tongue.    . pantoprazole (PROTONIX) 40 MG tablet Take 1 tablet (40 mg total) by mouth daily. 30 tablet 1  . tamsulosin (FLOMAX) 0.4 MG CAPS capsule Take 0.4 mg by mouth.    . vitamin B-12 (CYANOCOBALAMIN) 1000 MCG tablet Take 2,000 mcg by mouth daily.     No current facility-administered medications for this visit.     Review of Systems : See HPI for pertinent positives and negatives.  Physical Examination  Vitals:   08/22/16 1519 08/22/16 1521  BP: (!) 147/76 (!) 162/73  Pulse: 80   Resp: 20   Temp: (!) 97.1 F (36.2 C)   TempSrc: Oral   SpO2: 97%   Weight: 155 lb (70.3 kg)   Height: 5\' 9"  (1.753 m)    Body mass index is 22.89 kg/m.  General: WDWN elderly male in NAD GAIT: slow and deliberate, using walker Eyes: PERRLA Pulmonary:   Respirations are non-labored, good air movement in right posterior and anterior fields, + rales and limited air movement in left posterior lung fields, + moist sounding respirations. No cough  Cardiac: Irregular rhythm, controlled rate, no detected murmur.  VASCULAR EXAM Carotid Bruits Right Left   Negative Negative     Abdominal aortic pulse is not palpable. Radial pulses are 2+ palpable and equal.  LE Pulses Right Left       FEMORAL  2+ palpable  2+ palpable        POPLITEAL  2+ palpable   1+ palpable       POSTERIOR TIBIAL   1+palpable   1+ palpable        DORSALIS PEDIS      ANTERIOR TIBIAL  2+palpable  2+ palpable     Gastrointestinal: soft, nontender, BS WNL, no r/g,  no palpable masses.  Musculoskeletal: Age approriate muscle atrophy/wasting. M/S 5/5 throughout, extremities without ischemic changes.  Neurologic: A&O X 3; Appropriate Affect, speech is normal, edentulous, CN 2-12 intact except is very hard of hearing, pain and light touch intact in extremities, Motor exam as listed above.     Assessment: ARMAAN POND is a 83 y.o. male who is s/p left carotid endarterectomy on 05/24/2015. He had several TIA's preoperatively, none since the left CEA. The TIA's manifested as speech difficulties and transient right hemiparesis.  He has a prominent right popliteal pulse; no popliteal artery aneurysms on duplex today (08-22-16).   His atherosclerotic risk factors include smoker x 30 years until 1978, CAD, and atrial fib. He takes Eliquis, ASA, and a statin. Fortunately he does not have DM.  I recommend that his PCP evaluate the rales and limited air movement in his left posterior lung fields; He denies dyspnea or cough. 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    DATA Carotid Duplex (08/22/16) >50% RECA stenosis 1-39% RICA stenosis 45-80% LICA stenosis No significant change compared to the EDV on 12-19-15  Bilateral Popliteal Artery Duplex (08/22/16): No aneurysmal dilation.    Plan: Follow-up in 6 months with Carotid Duplex scan and bilateral popliteal arterial duplex.   I discussed in depth with the patient the nature of atherosclerosis, and emphasized the importance of maximal medical management including strict control of blood pressure, blood glucose, and lipid levels, obtaining regular exercise, and continued cessation of smoking.  The patient is aware that without maximal medical management the underlying atherosclerotic disease process will progress, limiting the benefit of any interventions. The patient was given information about stroke prevention and what symptoms should prompt the patient to seek immediate medical care. Thank you for allowing [place] to participate in this patient's care.  Clemon Chambers, RN, MSN, FNP-C [facility] Office: [phone]  Clinic Physician: Donzetta Matters on call  08/22/16 3:50 PM

## 2016-08-25 NOTE — Addendum Note (Signed)
Addended by: Lianne Cure A on: 08/25/2016 09:15 AM   Modules accepted: Orders

## 2016-08-26 DIAGNOSIS — R0989 Other specified symptoms and signs involving the circulatory and respiratory systems: Secondary | ICD-10-CM | POA: Diagnosis not present

## 2016-08-26 DIAGNOSIS — R54 Age-related physical debility: Secondary | ICD-10-CM | POA: Diagnosis not present

## 2016-09-02 ENCOUNTER — Ambulatory Visit (INDEPENDENT_AMBULATORY_CARE_PROVIDER_SITE_OTHER): Payer: PPO | Admitting: Neurology

## 2016-09-02 ENCOUNTER — Encounter: Payer: Self-pay | Admitting: Neurology

## 2016-09-02 VITALS — BP 159/89 | HR 55 | Ht 69.0 in | Wt 155.2 lb

## 2016-09-02 DIAGNOSIS — I63412 Cerebral infarction due to embolism of left middle cerebral artery: Secondary | ICD-10-CM | POA: Diagnosis not present

## 2016-09-02 DIAGNOSIS — Z7901 Long term (current) use of anticoagulants: Secondary | ICD-10-CM | POA: Diagnosis not present

## 2016-09-02 DIAGNOSIS — I482 Chronic atrial fibrillation, unspecified: Secondary | ICD-10-CM

## 2016-09-02 DIAGNOSIS — E785 Hyperlipidemia, unspecified: Secondary | ICD-10-CM

## 2016-09-02 DIAGNOSIS — I6523 Occlusion and stenosis of bilateral carotid arteries: Secondary | ICD-10-CM

## 2016-09-02 NOTE — Progress Notes (Signed)
STROKE NEUROLOGY FOLLOW UP NOTE  NAME: HALDON CARLEY DOB: 08-06-1929  REASON FOR VISIT: stroke follow up HISTORY FROM: pt and chart and daughters  Today we had the pleasure of seeing LAYTEN AIKEN in follow-up at our Neurology Clinic. Pt was accompanied by daughter.   History Summary Mr. ZANNIE RUNKLE is a 83 y.o. male with history of afib, CAD, HLD, HTN, MI, CVA transferred from [facility] for left brain stroke and left ICA stenosis on 05/22/15. Symptoms resolved. MRI found left subcortical WM infarcts. CUS left ICA 70-99% stenosis. Cerebral angio left ICA proximal 85% stenosis and right ICA cavernous 90% stenosis. TTE EF 55-60% and LDL 74 with A1C 5.9. VVS consulted and pt had left CEA done without complication. After CEA, pt was put on eliquis 2.5mg  bid because of his borderline weight, fluctuating Cre and fall hx of concurrent use of ASA. He was discharged with eliquis and ASA as well as lipitor.   Follow up 08/22/15 - the patient has been doing well. Followed with VVS and will repeat CUS in 6 months. Has recent episode of heart racing and was sent to ER by PCP for evaluation. The RVR episode resolved by its own. He has not seen cardiology yet.  Currently lives in ALF, no side effects from medication. Walk with walker today but at home walk without device. BP 154/84.   Follow up 12/11/15 - the pt has been doing well until 2 weeks ago when his wife passed away. Although they have not been living together for some time, he attended her funeral and since then he started to have dizziness on head motion. If stay still he is OK, with head motion he feel lightheadedness. He felt not steady on walking. He was sent to ER at [facility] and was told to have high BP, and sent back to ALF after BP stabilized. at ALF his multiple orthostatic vitals did not show orthostatic hypotension. He started to have frontal HA and neck tightness. He was also not happy with her children  recently. He has VVS follow up next week and repeat CUS at that time. Today in clinic, he came in with cane, but stated that he can walk without cane. BP 126/76.   Interval History During the interval time, pt has been doing well from stroke standpoint. Continued on eliquis and ASA without bleeding. Had repeat CUS 08/2016 showed left ICA 40-59% post CEA but right ICA patent. However, he did have other medical issues during the interval time including pneumonia, diarrhea, falling. He contributed his fall to left knee pain, getting up and turning too fast. For the last 6 months, he had 2 falls as per daughter. He is living in ALF now.   REVIEW OF SYSTEMS: Full 14 system review of systems performed and notable only for those listed below and in HPI above, all others are negative:  Constitutional:   Cardiovascular:  Ear/Nose/Throat:  Hearing loss Skin:  Eyes:   Respiratory:   Gastroitestinal:   Genitourinary:  Hematology/Lymphatic:   Endocrine:  Musculoskeletal:   Allergy/Immunology:   Neurological:  Dizziness  Psychiatric:  Sleep:   The following represents the patient's updated allergies and side effects list: Allergies  Allergen Reactions  . Plavix [Clopidogrel Bisulfate] Other (See Comments)    PT STATES MED DID NOT MAKE HIM FEEL WELL  . Xarelto [Rivaroxaban] Other (See Comments)    bleeding    The neurologically relevant items on the patient's problem list were reviewed on  today's visit.  Neurologic Examination  A problem focused neurological exam (12 or more points of the single system neurologic examination, vital signs counts as 1 point, cranial nerves count for 8 points) was performed.  Blood pressure (!) 159/89, pulse (!) 55, height 5\' 9"  (1.753 m), weight 155 lb 3.2 oz (70.4 kg).  General - Well nourished, well developed, in no apparent distress.  Ophthalmologic - Fundi not visualized due to noncooperation.  Cardiovascular - irregularly irregular heart rate and  rhythm.  Mental Status -  Level of arousal and orientation to time, place, and person were intact. Language including expression, naming, repetition, comprehension was assessed and found intact. Fund of Knowledge was assessed and was intact.  Cranial Nerves II - XII - II - Visual field intact OU. III, IV, VI - Extraocular movements intact. V - Facial sensation symmetrical. VII - Facial movement intact bilaterally. VIII - Hard of hearing & vestibular intact bilaterally. X - Palate elevates symmetrically. XI - Chin turning & shoulder shrug intact bilaterally. XII - Tongue protrusion intact.  Motor Strength - The patient's strength was normal in all extremities and pronator drift was absent.  Bulk was normal and fasciculations were absent.   Motor Tone - Muscle tone was assessed at the neck and appendages and was normal.  Reflexes - The patient's reflexes were 1+ in all extremities and he had no pathological reflexes.  Sensory - Light touch, temperature/pinprick were assessed and were intact.    Coordination - The patient had normal movements in the hands and feet with no ataxia or dysmetria.  Tremor was absent.  Gait and Station - walk with cane, slow and small stride but steady.   Data reviewed: I was not able to reviewed the radiological images below but obtained from documentation.  MRI: MRI at [facility] showed small infarcts in the subcortical white matter on the left.  CT HEAD: His CT of the head on 05/18/15 showed no acute stroke.  CAROTID DUPLEX: His carotid duplex at [facility] on 05/19/2015 showed a 70-99% left carotid stenosis.  I personally reviewed the images and agree with the radiology interpretations.  CEREBRAL ANGIOGRAM:   1.Approx 85 % stenosis of LT ICA prox. 2.Approx 90 % stenosis RT ICA cavernous seg with post stenotic dilatation  EKG - afib  CUS 08/22/16 - left ICA 40-59% stenosis post CEA. Right ICA 1-39% stenosis.   Component     Latest Ref  Rng 05/22/2015 05/23/2015  Cholesterol     0 - 200 mg/dL  132  Triglycerides     <150 mg/dL  60  HDL Cholesterol     >40 mg/dL  46  Total CHOL/HDL Ratio       2.9  VLDL     0 - 40 mg/dL  12  LDL (calc)     0 - 99 mg/dL  74  Hemoglobin A1C     4.8 - 5.6 % 5.9 (H)   Mean Plasma Glucose      123       Assessment: As you may recall, he is a 83 y.o. Caucasian male with PMH of afib, CAD, HLD, HTN, MI, CVA transferred from [facility] for left brain stroke and left ICA stenosis on 05/22/15. Symptoms resolved. MRI found left subcortical WM infarcts. CUS left ICA 70-99% stenosis. Cerebral angio left ICA proximal 85% stenosis and right ICA cavernous 90% stenosis. TTE EF 55-60% and LDL 74 with A1C 5.9. VVS consulted and pt had left CEA done without complication.  After CEA, pt was put on eliquis 2.5mg  bid because of his borderline weight, fluctuating Cre and fall hx of concurrent use of ASA. He was discharged with eliquis and ASA 81mg  as well as lipitor. During the interval time, the patient has been doing well. Followed with VVS and repeat CUS showed left ICA 40-59% stenosis post CEA.   Plan:  - continue eliquis 5mg  bid and ASA 81mg  and lipitor for stroke prevention - will request recent labs from PCP - check BP and record and BP goal 130-150 due to right ICA cavernous 90% stenosis - follow up with cardiology and vascular surgery as scheduled - Follow up with your primary care physician for stroke risk factor modification. Recommend maintain blood pressure goal around 130-150/80-90, diabetes with hemoglobin A1c goal below 7.0% and lipids with LDL cholesterol goal below 70 mg/dL.  - avoid fall, walk with walker if needed. - follow up in 6 months with Hoyle Sauer. If stable, can be discharged from clinic.   I spent more than 25 minutes of face to face time with the patient. Greater than 50% of time was spent in counseling and coordination of care. We discussed eliquis dosage, request lab from PCP,  and follow up with VVS.    No orders of the defined types were placed in this encounter.   No orders of the defined types were placed in this encounter.   Patient Instructions  - continue eliquis 5mg  bid and ASA 81mg  and lipitor for stroke prevention - will request recent labs from PCP - check BP and record and BP goal 130-150 due to right ICA cavernous 90% stenosis - follow up with cardiology in [place] for afib management - follow up with vascular surgery as scheduled - Follow up with your primary care physician for stroke risk factor modification. Recommend maintain blood pressure goal around 130-150/80-90, diabetes with hemoglobin A1c goal below 7.0% and lipids with LDL cholesterol goal below 70 mg/dL.  - avoid fall, walk with walker if needed. - follow up in 6 months.   Rosalin Hawking, MD PhD [facility] [address], [place], [place] [address] [phone]

## 2016-09-02 NOTE — Patient Instructions (Addendum)
-   continue eliquis 5mg  bid and ASA 81mg  and lipitor for stroke prevention - will request recent labs from PCP - check BP and record and BP goal 130-150 due to right ICA cavernous 90% stenosis - follow up with cardiology in [place] for afib management - follow up with vascular surgery as scheduled - Follow up with your primary care physician for stroke risk factor modification. Recommend maintain blood pressure goal around 130-150/80-90, diabetes with hemoglobin A1c goal below 7.0% and lipids with LDL cholesterol goal below 70 mg/dL.  - avoid fall, walk with walker if needed. - follow up in 6 months.

## 2016-09-03 DIAGNOSIS — I6523 Occlusion and stenosis of bilateral carotid arteries: Secondary | ICD-10-CM | POA: Insufficient documentation

## 2016-09-03 HISTORY — DX: Occlusion and stenosis of bilateral carotid arteries: I65.23

## 2016-09-05 ENCOUNTER — Ambulatory Visit: Payer: PPO | Admitting: Family

## 2016-09-05 ENCOUNTER — Encounter (HOSPITAL_COMMUNITY): Payer: PPO

## 2016-11-06 DIAGNOSIS — Z23 Encounter for immunization: Secondary | ICD-10-CM | POA: Diagnosis not present

## 2016-12-01 DIAGNOSIS — N183 Chronic kidney disease, stage 3 (moderate): Secondary | ICD-10-CM | POA: Diagnosis not present

## 2016-12-01 DIAGNOSIS — I129 Hypertensive chronic kidney disease with stage 1 through stage 4 chronic kidney disease, or unspecified chronic kidney disease: Secondary | ICD-10-CM | POA: Diagnosis not present

## 2016-12-01 DIAGNOSIS — I509 Heart failure, unspecified: Secondary | ICD-10-CM | POA: Diagnosis not present

## 2016-12-01 DIAGNOSIS — I4891 Unspecified atrial fibrillation: Secondary | ICD-10-CM | POA: Diagnosis not present

## 2016-12-03 DIAGNOSIS — I10 Essential (primary) hypertension: Secondary | ICD-10-CM | POA: Diagnosis not present

## 2016-12-03 DIAGNOSIS — I509 Heart failure, unspecified: Secondary | ICD-10-CM | POA: Diagnosis not present

## 2016-12-03 DIAGNOSIS — D649 Anemia, unspecified: Secondary | ICD-10-CM | POA: Diagnosis not present

## 2017-02-04 DIAGNOSIS — R05 Cough: Secondary | ICD-10-CM | POA: Diagnosis not present

## 2017-02-04 DIAGNOSIS — J029 Acute pharyngitis, unspecified: Secondary | ICD-10-CM | POA: Diagnosis not present

## 2017-02-04 DIAGNOSIS — I482 Chronic atrial fibrillation: Secondary | ICD-10-CM | POA: Diagnosis not present

## 2017-02-04 DIAGNOSIS — I10 Essential (primary) hypertension: Secondary | ICD-10-CM | POA: Diagnosis not present

## 2017-02-04 DIAGNOSIS — J111 Influenza due to unidentified influenza virus with other respiratory manifestations: Secondary | ICD-10-CM | POA: Diagnosis not present

## 2017-02-25 ENCOUNTER — Ambulatory Visit: Payer: PPO | Admitting: Family

## 2017-02-25 ENCOUNTER — Encounter (HOSPITAL_COMMUNITY): Payer: PPO

## 2017-02-26 ENCOUNTER — Ambulatory Visit: Payer: PPO | Admitting: Family

## 2017-02-26 ENCOUNTER — Encounter (HOSPITAL_COMMUNITY): Payer: PPO

## 2017-02-27 DIAGNOSIS — H9201 Otalgia, right ear: Secondary | ICD-10-CM | POA: Diagnosis not present

## 2017-02-27 DIAGNOSIS — R42 Dizziness and giddiness: Secondary | ICD-10-CM | POA: Diagnosis not present

## 2017-03-04 NOTE — Progress Notes (Deleted)
[facility]  PATIENT: Dave Harris DOB: [birth year]   REASON FOR VISIT: *** HISTORY FROM:    HISTORY OF PRESENT ILLNESS:Dave Harris is a 84 y.o. male with history of afib, CAD, HLD, HTN, MI, CVA transferred from [facility] for left brain stroke and left ICA stenosis on 05/22/15. Symptoms resolved. MRI found left subcortical WM infarcts. CUS left ICA 70-99% stenosis. Cerebral angio left ICA proximal 85% stenosis and right ICA cavernous 90% stenosis. TTE EF 55-60% and LDL 74 with A1C 5.9. VVS consulted and pt had left CEA done without complication. After CEA, pt was put on eliquis 2.5mg  bid because of his borderline weight, fluctuating Cre and fall hx of concurrent use of ASA. He was discharged with eliquis and ASA as well as lipitor.   Follow up 08/22/15 - the patient has been doing well. Followed with VVS and will repeat CUS in 6 months. Has recent episode of heart racing and was sent to ER by PCP for evaluation. The RVR episode resolved by its own. He has not seen cardiology yet.  Currently lives in ALF, no side effects from medication. Walk with walker today but at home walk without device. BP 154/84.   Follow up 12/11/15 - the pt has been doing well until 2 weeks ago when his wife passed away. Although they have not been living together for some time, he attended her funeral and since then he started to have dizziness on head motion. If stay still he is OK, with head motion he feel lightheadedness. He felt not steady on walking. He was sent to ER at [facility] and was told to have high BP, and sent back to ALF after BP stabilized. at ALF his multiple orthostatic vitals did not show orthostatic hypotension. He started to have frontal HA and neck tightness. He was also not happy with her children recently. He has VVS follow up next week and repeat CUS at that time. Today in clinic, he came in with cane, but stated that he can walk without cane. BP  126/76.   Interval History During the interval time, pt has been doing well from stroke standpoint. Continued on eliquis and ASA without bleeding. Had repeat CUS 08/2016 showed left ICA 40-59% post CEA but right ICA patent. However, he did have other medical issues during the interval time including pneumonia, diarrhea, falling. He contributed his fall to left knee pain, getting up and turning too fast. For the last 6 months, he had 2 falls as per daughter. He is living in ALF now.     REVIEW OF SYSTEMS: Full 14 system review of systems performed and notable only for those listed, all others are neg:  Constitutional: neg  Cardiovascular: neg Ear/Nose/Throat: neg  Skin: neg Eyes: neg Respiratory: neg Gastroitestinal: neg  Hematology/Lymphatic: neg  Endocrine: neg Musculoskeletal:neg Allergy/Immunology: neg Neurological: neg Psychiatric: neg Sleep : neg   ALLERGIES: Allergies  Allergen Reactions  . Plavix [Clopidogrel Bisulfate] Other (See Comments)    PT STATES MED DID NOT MAKE HIM FEEL WELL  . Xarelto [Rivaroxaban] Other (See Comments)    bleeding    HOME MEDICATIONS: Outpatient Medications Prior to Visit  Medication Sig Dispense Refill  . Acetaminophen (APAP) 325 MG tablet Take 650 mg by mouth every 6 (six) hours as needed for pain.    Marland Kitchen anti-nausea (EMETROL) solution Take 10 mLs by mouth as needed for nausea or vomiting.    Marland Kitchen apixaban (ELIQUIS) 5 MG TABS tablet Take  5 mg by mouth 2 (two) times daily.    Marland Kitchen aspirin 81 MG tablet Take 81 mg by mouth daily.    Marland Kitchen atorvastatin (LIPITOR) 40 MG tablet Take 40 mg by mouth.    . bisacodyl (DULCOLAX) 10 MG suppository Place 1 suppository (10 mg total) rectally daily as needed for moderate constipation. 12 suppository 0  . carvedilol (COREG) 6.25 MG tablet Take 6.25 mg by mouth 2 (two) times daily with a meal.    . cloNIDine (CATAPRES) 0.1 MG tablet     . Dextromethorphan-Guaifenesin (ROBITUSSIN DM) 10-100 MG/5ML liquid Take 5 mLs  by mouth every 12 (twelve) hours.    . docusate sodium (COLACE) 100 MG capsule Take 100 mg by mouth.    . fexofenadine (ALLEGRA) 180 MG tablet Take 180 mg by mouth daily.    . finasteride (PROSCAR) 5 MG tablet Take 5 mg by mouth daily.    Marland Kitchen HYDROcodone-acetaminophen (NORCO/VICODIN) 5-325 MG tablet Take by mouth.    Marland Kitchen LINZESS 145 MCG CAPS capsule     . loperamide (IMODIUM A-D) 2 MG capsule Take by mouth as needed for diarrhea or loose stools.    . mirtazapine (REMERON) 30 MG tablet Take 30 mg by mouth.    . Multiple Vitamins-Minerals (PRESERVISION AREDS PO) Take 1 tablet by mouth every morning.    . nitroGLYCERIN (NITROSTAT) 0.4 MG SL tablet Place 0.4 mg under the tongue.    . pantoprazole (PROTONIX) 40 MG tablet Take 1 tablet (40 mg total) by mouth daily. 30 tablet 1  . tamsulosin (FLOMAX) 0.4 MG CAPS capsule Take 0.4 mg by mouth.    . vitamin B-12 (CYANOCOBALAMIN) 1000 MCG tablet Take 2,000 mcg by mouth daily.     No facility-administered medications prior to visit.     PAST MEDICAL HISTORY: Past Medical History:  Diagnosis Date  . CAD (coronary artery disease)   . CHF (congestive heart failure) ([place])   . Chronic atrial fibrillation ([place])   . Complication of anesthesia    "sometimes I'm hard to wake up (05/23/2015)  . Depression   . Heart disease   . History of blood transfusion    "w/my heart OR"  . History of stomach ulcers "sever"  . Hyperlipidemia   . Hypertension   . Myocardial infarction ([place])   . Stroke [facility]) several   "6 since June 2016; last one was 05/19/2015"; denies residual  (05/23/2015)    PAST SURGICAL HISTORY: Past Surgical History:  Procedure Laterality Date  . BACK SURGERY    . CARDIAC CATHETERIZATION    . CARPAL TUNNEL RELEASE Bilateral   . CATARACT EXTRACTION W/ INTRAOCULAR LENS  IMPLANT, BILATERAL Bilateral   . COLON SURGERY    . CORONARY ARTERY BYPASS GRAFT  1978   CABG X"?3"  . ENDARTERECTOMY Left 05/24/2015   Procedure: Left ENDARTERECTOMY CAROTID;   Surgeon: Angelia Mould, MD;  Location: [place];  Service: Vascular;  Laterality: Left;  . LUMBAR DISC SURGERY     "had 4 ruptured discs"  . MULTIPLE TOOTH EXTRACTIONS    . STOMACH SURGERY     "had 1/2 my stomach removed; had several stomach ulcers"  . TONSILLECTOMY AND ADENOIDECTOMY  ~ 1947  . TRANSURETHRAL RESECTION OF PROSTATE  X 3   "never removed it" (05/22/2015)    FAMILY HISTORY: Family History  Problem Relation Age of Onset  . CAD Unknown   . Heart disease Brother        before age 7  . Heart  attack Brother     SOCIAL HISTORY: Social History   Socioeconomic History  . Marital status: Widowed    Spouse name: Not on file  . Number of children: Not on file  . Years of education: Not on file  . Highest education level: Not on file  Social Needs  . Financial resource strain: Not on file  . Food insecurity - worry: Not on file  . Food insecurity - inability: Not on file  . Transportation needs - medical: Not on file  . Transportation needs - non-medical: Not on file  Occupational History  . Not on file  Tobacco Use  . Smoking status: Former Smoker    Packs/day: 2.00    Years: 30.00    Pack years: 60.00    Types: Cigarettes  . Smokeless tobacco: Never Used  . Tobacco comment: "quit smoking cigarettes in 1978 when I had my heart surgery"  Substance and Sexual Activity  . Alcohol use: No    Alcohol/week: 0.0 oz  . Drug use: No  . Sexual activity: No  Other Topics Concern  . Not on file  Social History Narrative  . Not on file     PHYSICAL EXAM  There were no vitals filed for this visit. There is no height or weight on file to calculate BMI.  Generalized: Well developed, in no acute distress  Head: normocephalic and atraumatic,. Oropharynx benign  Neck: Supple, no carotid bruits  Cardiac: Regular rate rhythm, no murmur  Musculoskeletal: No deformity   Neurological examination   Mentation: Alert oriented to time, place, history taking. Attention  span and concentration appropriate. Recent and remote memory intact.  Follows all commands speech and language fluent.   Cranial nerve II-XII: Fundoscopic exam reveals sharp disc margins.Pupils were equal round reactive to light extraocular movements were full, visual field were full on confrontational test. Facial sensation and strength were normal. hearing was intact to finger rubbing bilaterally. Uvula tongue midline. head turning and shoulder shrug were normal and symmetric.Tongue protrusion into cheek strength was normal. Motor: normal bulk and tone, full strength in the BUE, BLE, fine finger movements normal, no pronator drift. No focal weakness Sensory: normal and symmetric to light touch, pinprick, and  Vibration, proprioception  Coordination: finger-nose-finger, heel-to-shin bilaterally, no dysmetria Reflexes: Brachioradialis 2/2, biceps 2/2, triceps 2/2, patellar 2/2, Achilles 2/2, plantar responses were flexor bilaterally. Gait and Station: Rising up from seated position without assistance, normal stance,  moderate stride, good arm swing, smooth turning, able to perform tiptoe, and heel walking without difficulty. Tandem gait is steady  DIAGNOSTIC DATA (LABS, IMAGING, TESTING) - I reviewed patient records, labs, notes, testing and imaging myself where available.  Lab Results  Component Value Date   WBC 10.4 05/25/2015   HGB 12.2 (L) 05/25/2015   HCT 38.1 (L) 05/25/2015   MCV 88.2 05/25/2015   PLT 144 (L) 05/25/2015      Component Value Date/Time   NA 140 05/25/2015 0445   K 3.9 05/25/2015 0445   CL 110 05/25/2015 0445   CO2 20 (L) 05/25/2015 0445   GLUCOSE 121 (H) 05/25/2015 0445   BUN 15 05/25/2015 0445   CREATININE 0.98 05/25/2015 0445   CALCIUM 8.9 05/25/2015 0445   GFRNONAA >60 05/25/2015 0445   GFRAA >60 05/25/2015 0445   Lab Results  Component Value Date   CHOL 132 05/23/2015   HDL 46 05/23/2015   LDLCALC 74 05/23/2015   TRIG 60 05/23/2015   CHOLHDL 2.9  05/23/2015  Lab Results  Component Value Date   HGBA1C 5.9 (H) 05/22/2015    ASSESSMENT AND PLAN     84 y.o. Caucasian male with PMH of afib, CAD, HLD, HTN, MI, CVA transferred from [facility] for left brain stroke and left ICA stenosis on 05/22/15. Symptoms resolved. MRI found left subcortical WM infarcts. CUS left ICA 70-99% stenosis. Cerebral angio left ICA proximal 85% stenosis and right ICA cavernous 90% stenosis. TTE EF 55-60% and LDL 74 with A1C 5.9. VVS consulted and pt had left CEA done without complication. After CEA, pt was put on eliquis 2.5mg  bid because of his borderline weight, fluctuating Cre and fall hx of concurrent use of ASA. He was discharged with eliquis and ASA 81mg  as well as lipitor. During the interval time, the patient has been doing well. Followed with VVS and repeat CUS showed left ICA 40-59% stenosis post CEA.   Plan:  - continue eliquis 5mg  bid and ASA 81mg  and lipitor for stroke prevention - will request recent labs from PCP - check BP and record and BP goal 130-150 due to right ICA cavernous 90% stenosis - follow up with cardiology and vascular surgery as scheduled - Follow up with your primary care physician for stroke risk factor modification. Recommend maintain blood pressure goal around 130-150/80-90, diabetes with hemoglobin A1c goal below 7.0% and lipids with LDL cholesterol goal below 70 mg/dL.  - avoid fall, walk with walker if needed. - follow up in 6 months with Hoyle Sauer. If stable, can be discharged from clinic.   Dennie Bible, [facility], [facility], APRN  [facility] [address], [place], [place] [address] [phone]

## 2017-03-05 ENCOUNTER — Telehealth: Payer: Self-pay | Admitting: *Deleted

## 2017-03-05 ENCOUNTER — Ambulatory Visit: Payer: PPO | Admitting: Nurse Practitioner

## 2017-03-05 NOTE — Telephone Encounter (Signed)
Patient was no show for FU with NP today. 

## 2017-03-06 ENCOUNTER — Encounter: Payer: Self-pay | Admitting: Nurse Practitioner

## 2017-03-09 DIAGNOSIS — R05 Cough: Secondary | ICD-10-CM | POA: Diagnosis not present

## 2017-03-09 DIAGNOSIS — I2510 Atherosclerotic heart disease of native coronary artery without angina pectoris: Secondary | ICD-10-CM | POA: Diagnosis not present

## 2017-03-09 DIAGNOSIS — I129 Hypertensive chronic kidney disease with stage 1 through stage 4 chronic kidney disease, or unspecified chronic kidney disease: Secondary | ICD-10-CM | POA: Diagnosis not present

## 2017-03-09 DIAGNOSIS — R0602 Shortness of breath: Secondary | ICD-10-CM | POA: Diagnosis not present

## 2017-03-09 DIAGNOSIS — R600 Localized edema: Secondary | ICD-10-CM | POA: Diagnosis not present

## 2017-03-10 DIAGNOSIS — D649 Anemia, unspecified: Secondary | ICD-10-CM | POA: Diagnosis not present

## 2017-03-10 DIAGNOSIS — I509 Heart failure, unspecified: Secondary | ICD-10-CM | POA: Diagnosis not present

## 2017-03-10 DIAGNOSIS — I10 Essential (primary) hypertension: Secondary | ICD-10-CM | POA: Diagnosis not present

## 2017-03-10 DIAGNOSIS — E509 Vitamin A deficiency, unspecified: Secondary | ICD-10-CM | POA: Diagnosis not present

## 2017-03-18 DIAGNOSIS — I509 Heart failure, unspecified: Secondary | ICD-10-CM | POA: Diagnosis not present

## 2017-04-02 DIAGNOSIS — I4891 Unspecified atrial fibrillation: Secondary | ICD-10-CM | POA: Diagnosis not present

## 2017-04-02 DIAGNOSIS — I129 Hypertensive chronic kidney disease with stage 1 through stage 4 chronic kidney disease, or unspecified chronic kidney disease: Secondary | ICD-10-CM | POA: Diagnosis not present

## 2017-04-02 DIAGNOSIS — I509 Heart failure, unspecified: Secondary | ICD-10-CM | POA: Diagnosis not present

## 2017-04-02 DIAGNOSIS — I2510 Atherosclerotic heart disease of native coronary artery without angina pectoris: Secondary | ICD-10-CM | POA: Diagnosis not present

## 2017-04-08 DIAGNOSIS — E039 Hypothyroidism, unspecified: Secondary | ICD-10-CM | POA: Diagnosis not present

## 2017-04-08 DIAGNOSIS — R7309 Other abnormal glucose: Secondary | ICD-10-CM | POA: Diagnosis not present

## 2017-04-08 DIAGNOSIS — E559 Vitamin D deficiency, unspecified: Secondary | ICD-10-CM | POA: Diagnosis not present

## 2017-04-10 DIAGNOSIS — R799 Abnormal finding of blood chemistry, unspecified: Secondary | ICD-10-CM | POA: Diagnosis not present

## 2017-04-10 DIAGNOSIS — K5900 Constipation, unspecified: Secondary | ICD-10-CM | POA: Diagnosis not present

## 2017-04-10 DIAGNOSIS — E559 Vitamin D deficiency, unspecified: Secondary | ICD-10-CM | POA: Diagnosis not present

## 2017-04-17 DIAGNOSIS — R0989 Other specified symptoms and signs involving the circulatory and respiratory systems: Secondary | ICD-10-CM | POA: Diagnosis not present

## 2017-04-17 DIAGNOSIS — I509 Heart failure, unspecified: Secondary | ICD-10-CM | POA: Diagnosis not present

## 2017-04-17 DIAGNOSIS — R0602 Shortness of breath: Secondary | ICD-10-CM | POA: Diagnosis not present

## 2017-04-17 DIAGNOSIS — K5900 Constipation, unspecified: Secondary | ICD-10-CM | POA: Diagnosis not present

## 2017-04-20 DIAGNOSIS — R0989 Other specified symptoms and signs involving the circulatory and respiratory systems: Secondary | ICD-10-CM | POA: Diagnosis not present

## 2017-04-20 DIAGNOSIS — I509 Heart failure, unspecified: Secondary | ICD-10-CM | POA: Diagnosis not present

## 2017-04-20 DIAGNOSIS — R05 Cough: Secondary | ICD-10-CM | POA: Diagnosis not present

## 2017-04-21 DIAGNOSIS — Z8673 Personal history of transient ischemic attack (TIA), and cerebral infarction without residual deficits: Secondary | ICD-10-CM | POA: Diagnosis not present

## 2017-04-21 DIAGNOSIS — N400 Enlarged prostate without lower urinary tract symptoms: Secondary | ICD-10-CM | POA: Diagnosis not present

## 2017-04-21 DIAGNOSIS — K219 Gastro-esophageal reflux disease without esophagitis: Secondary | ICD-10-CM | POA: Diagnosis not present

## 2017-04-21 DIAGNOSIS — I252 Old myocardial infarction: Secondary | ICD-10-CM | POA: Diagnosis not present

## 2017-04-21 DIAGNOSIS — R079 Chest pain, unspecified: Secondary | ICD-10-CM | POA: Diagnosis not present

## 2017-04-21 DIAGNOSIS — I2510 Atherosclerotic heart disease of native coronary artery without angina pectoris: Secondary | ICD-10-CM | POA: Diagnosis not present

## 2017-04-21 DIAGNOSIS — Z7982 Long term (current) use of aspirin: Secondary | ICD-10-CM | POA: Diagnosis not present

## 2017-04-21 DIAGNOSIS — J90 Pleural effusion, not elsewhere classified: Secondary | ICD-10-CM | POA: Diagnosis not present

## 2017-04-21 DIAGNOSIS — J811 Chronic pulmonary edema: Secondary | ICD-10-CM | POA: Diagnosis not present

## 2017-04-21 DIAGNOSIS — R001 Bradycardia, unspecified: Secondary | ICD-10-CM | POA: Diagnosis not present

## 2017-04-21 DIAGNOSIS — F329 Major depressive disorder, single episode, unspecified: Secondary | ICD-10-CM | POA: Diagnosis not present

## 2017-04-21 DIAGNOSIS — E7800 Pure hypercholesterolemia, unspecified: Secondary | ICD-10-CM | POA: Diagnosis not present

## 2017-04-21 DIAGNOSIS — Z66 Do not resuscitate: Secondary | ICD-10-CM | POA: Diagnosis not present

## 2017-04-21 DIAGNOSIS — I482 Chronic atrial fibrillation: Secondary | ICD-10-CM | POA: Diagnosis not present

## 2017-04-21 DIAGNOSIS — I509 Heart failure, unspecified: Secondary | ICD-10-CM | POA: Diagnosis not present

## 2017-04-21 DIAGNOSIS — Z888 Allergy status to other drugs, medicaments and biological substances status: Secondary | ICD-10-CM | POA: Diagnosis not present

## 2017-04-21 DIAGNOSIS — R42 Dizziness and giddiness: Secondary | ICD-10-CM | POA: Diagnosis not present

## 2017-04-21 DIAGNOSIS — Z79899 Other long term (current) drug therapy: Secondary | ICD-10-CM | POA: Diagnosis not present

## 2017-04-21 DIAGNOSIS — Z951 Presence of aortocoronary bypass graft: Secondary | ICD-10-CM | POA: Diagnosis not present

## 2017-04-21 DIAGNOSIS — I110 Hypertensive heart disease with heart failure: Secondary | ICD-10-CM | POA: Diagnosis not present

## 2017-04-22 DIAGNOSIS — R42 Dizziness and giddiness: Secondary | ICD-10-CM | POA: Diagnosis not present

## 2017-04-22 DIAGNOSIS — R079 Chest pain, unspecified: Secondary | ICD-10-CM | POA: Diagnosis not present

## 2017-04-23 DIAGNOSIS — I6529 Occlusion and stenosis of unspecified carotid artery: Secondary | ICD-10-CM | POA: Diagnosis not present

## 2017-04-23 DIAGNOSIS — R634 Abnormal weight loss: Secondary | ICD-10-CM | POA: Diagnosis not present

## 2017-04-23 DIAGNOSIS — I4891 Unspecified atrial fibrillation: Secondary | ICD-10-CM | POA: Diagnosis not present

## 2017-04-23 DIAGNOSIS — R42 Dizziness and giddiness: Secondary | ICD-10-CM | POA: Diagnosis not present

## 2017-05-21 DIAGNOSIS — R05 Cough: Secondary | ICD-10-CM | POA: Diagnosis not present

## 2017-05-21 DIAGNOSIS — R5381 Other malaise: Secondary | ICD-10-CM | POA: Diagnosis not present

## 2017-05-21 DIAGNOSIS — R0989 Other specified symptoms and signs involving the circulatory and respiratory systems: Secondary | ICD-10-CM | POA: Diagnosis not present

## 2017-05-21 DIAGNOSIS — R509 Fever, unspecified: Secondary | ICD-10-CM | POA: Diagnosis not present

## 2017-05-22 DIAGNOSIS — R0989 Other specified symptoms and signs involving the circulatory and respiratory systems: Secondary | ICD-10-CM | POA: Diagnosis not present

## 2017-05-22 DIAGNOSIS — R54 Age-related physical debility: Secondary | ICD-10-CM | POA: Diagnosis not present

## 2017-05-22 DIAGNOSIS — R05 Cough: Secondary | ICD-10-CM | POA: Diagnosis not present

## 2017-05-22 DIAGNOSIS — J209 Acute bronchitis, unspecified: Secondary | ICD-10-CM | POA: Diagnosis not present

## 2017-06-01 ENCOUNTER — Encounter: Payer: Self-pay | Admitting: Family

## 2017-06-01 ENCOUNTER — Other Ambulatory Visit: Payer: Self-pay

## 2017-06-01 ENCOUNTER — Ambulatory Visit (HOSPITAL_COMMUNITY)
Admission: RE | Admit: 2017-06-01 | Discharge: 2017-06-01 | Disposition: A | Discharge status: Home / Self Care | Payer: PPO | Source: Ambulatory Visit | Attending: Family | Admitting: Family

## 2017-06-01 ENCOUNTER — Ambulatory Visit (INDEPENDENT_AMBULATORY_CARE_PROVIDER_SITE_OTHER): Payer: PPO | Admitting: Family

## 2017-06-01 VITALS — BP 134/80 | HR 73 | Temp 97.2°F | Resp 20 | Ht 69.0 in | Wt 150.0 lb

## 2017-06-01 DIAGNOSIS — Z9889 Other specified postprocedural states: Secondary | ICD-10-CM | POA: Diagnosis not present

## 2017-06-01 DIAGNOSIS — I2510 Atherosclerotic heart disease of native coronary artery without angina pectoris: Secondary | ICD-10-CM | POA: Insufficient documentation

## 2017-06-01 DIAGNOSIS — I6523 Occlusion and stenosis of bilateral carotid arteries: Secondary | ICD-10-CM | POA: Insufficient documentation

## 2017-06-01 DIAGNOSIS — I6522 Occlusion and stenosis of left carotid artery: Secondary | ICD-10-CM

## 2017-06-01 DIAGNOSIS — E785 Hyperlipidemia, unspecified: Secondary | ICD-10-CM | POA: Insufficient documentation

## 2017-06-01 DIAGNOSIS — Z8673 Personal history of transient ischemic attack (TIA), and cerebral infarction without residual deficits: Secondary | ICD-10-CM | POA: Diagnosis not present

## 2017-06-01 DIAGNOSIS — I10 Essential (primary) hypertension: Secondary | ICD-10-CM | POA: Insufficient documentation

## 2017-06-01 NOTE — Patient Instructions (Signed)

## 2017-06-01 NOTE — Progress Notes (Signed)
Chief Complaint: Follow up Extracranial Carotid Artery Stenosis   History of Present Illness  Dave Harris is a 84 y.o. male who was having repeated episodes of syncope. He was found to have a tight left carotid stenosis. Left carotid endarterectomy was recommended in order to lower his risk of future stroke. Of note he had a fairly large artery and for this reason the endarterectomy site was closed primarily. His surgery was on 05/24/2015, performed by Dr. Scot Dock. Of note, his preoperative cerebral arteriogram showed no significant stenosis on the right. He returns for routine follow up today.   Daughter with pt at a previous visit stated he had several TIA's preoperatively, nonesince the left CEA. The TIA's manifested as speech difficulties and transient right hemiparesis, daughter denied that pt had any vision changes. Daughter stated pt has c/o his legs being weak, pt is very hard of hearing.  Pt lives in [facility]   He denies any focal weakness or paresthesias. He has no problems swallowing.   He has issues with falling only when he stands up too fast, states he has had several episodes of feeling dizzy for a long time. He states he falls and feels like he is going to fall only when he is standing.   He is on aspirin and is on a statin.  Pt denies shortness of breath, he has moist sounding respirations, no cough.   He sees Dr. Erlinda Hong, neurologist.    Pt Diabetic: no Pt smoker: former smoker, quit in 1978 when he had his CABG, smoked x 30 years  Pt meds include: Statin : yes ASA: yes Other anticoagulants/antiplatelets: Eliquis for atrial fib   Past Medical History:  Diagnosis Date  . CAD (coronary artery disease)   . CHF (congestive heart failure) ([place])   . Chronic atrial fibrillation ([place])   . Complication of anesthesia    "sometimes I'm hard to wake up (05/23/2015)  . Depression   . Heart disease   . History of blood  transfusion    "w/my heart OR"  . History of stomach ulcers "sever"  . Hyperlipidemia   . Hypertension   . Myocardial infarction ([place])   . Stroke [facility]) several   "6 since June 2016; last one was 05/19/2015"; denies residual  (05/23/2015)    Social History Social History   Tobacco Use  . Smoking status: Former Smoker    Packs/day: 2.00    Years: 30.00    Pack years: 60.00    Types: Cigarettes  . Smokeless tobacco: Never Used  . Tobacco comment: "quit smoking cigarettes in 1978 when I had my heart surgery"  Substance Use Topics  . Alcohol use: No    Alcohol/week: 0.0 oz  . Drug use: No    Family History Family History  Problem Relation Age of Onset  . CAD Unknown   . Heart disease Brother        before age 38  . Heart attack Brother     Surgical History Past Surgical History:  Procedure Laterality Date  . BACK SURGERY    . CARDIAC CATHETERIZATION    . CARPAL TUNNEL RELEASE Bilateral   . CATARACT EXTRACTION W/ INTRAOCULAR LENS  IMPLANT, BILATERAL Bilateral   . COLON SURGERY    . CORONARY ARTERY BYPASS GRAFT  1978   CABG X"?3"  . ENDARTERECTOMY Left 05/24/2015   Procedure: Left ENDARTERECTOMY CAROTID;  Surgeon: Angelia Mould, MD;  Location: [place];  Service: Vascular;  Laterality:  Left;  . LUMBAR DISC SURGERY     "had 4 ruptured discs"  . MULTIPLE TOOTH EXTRACTIONS    . STOMACH SURGERY     "had 1/2 my stomach removed; had several stomach ulcers"  . TONSILLECTOMY AND ADENOIDECTOMY  ~ 1947  . TRANSURETHRAL RESECTION OF PROSTATE  X 3   "never removed it" (05/22/2015)    Allergies  Allergen Reactions  . Plavix [Clopidogrel Bisulfate] Other (See Comments)    PT STATES MED DID NOT MAKE HIM FEEL WELL  . Xarelto [Rivaroxaban] Other (See Comments)    bleeding    Current Outpatient Medications  Medication Sig Dispense Refill  . apixaban (ELIQUIS) 5 MG TABS tablet Take 5 mg by mouth 2 (two) times daily.    Marland Kitchen aspirin 81 MG tablet Take 81 mg by mouth daily.    Marland Kitchen  atorvastatin (LIPITOR) 40 MG tablet Take 40 mg by mouth.    . bisacodyl (DULCOLAX) 10 MG suppository Place 10 mg rectally as needed for moderate constipation.    . carvedilol (COREG) 6.25 MG tablet Take 6.25 mg by mouth 2 (two) times daily with a meal.    . Cholecalciferol (VITAMIN D3) 2000 units TABS Take by mouth.    . Dextromethorphan-Guaifenesin (ROBITUSSIN DM) 10-100 MG/5ML liquid Take 5 mLs by mouth every 12 (twelve) hours.    . docusate sodium (COLACE) 100 MG capsule Take 100 mg by mouth.    . fexofenadine (ALLEGRA) 180 MG tablet Take 180 mg by mouth daily.    . finasteride (PROSCAR) 5 MG tablet Take 5 mg by mouth daily.    . furosemide (LASIX) 20 MG tablet Take 20 mg by mouth.    Marland Kitchen guaiFENesin (MUCINEX) 600 MG 12 hr tablet Take by mouth 2 (two) times daily.    Marland Kitchen HYDROcodone-acetaminophen (NORCO/VICODIN) 5-325 MG tablet Take by mouth.    . levofloxacin (LEVAQUIN) 500 MG tablet Take 500 mg by mouth daily.    Marland Kitchen LINZESS 145 MCG CAPS capsule     . meclizine (ANTIVERT) 25 MG tablet Take 25 mg by mouth 3 (three) times daily as needed for dizziness.    . mirtazapine (REMERON) 30 MG tablet Take 30 mg by mouth.    . Multiple Vitamins-Minerals (PRESERVISION AREDS PO) Take 1 tablet by mouth every morning.    . nitroGLYCERIN (NITROSTAT) 0.4 MG SL tablet Place 0.4 mg under the tongue.    . pantoprazole (PROTONIX) 40 MG tablet Take 1 tablet (40 mg total) by mouth daily. 30 tablet 1  . senna-docusate (SENNA-PLUS) 8.6-50 MG tablet Take 1 tablet by mouth daily.    . tamsulosin (FLOMAX) 0.4 MG CAPS capsule Take 0.4 mg by mouth.    . vitamin B-12 (CYANOCOBALAMIN) 1000 MCG tablet Take 2,000 mcg by mouth daily.    . cloNIDine (CATAPRES) 0.1 MG tablet     . loperamide (IMODIUM A-D) 2 MG capsule Take by mouth as needed for diarrhea or loose stools.     No current facility-administered medications for this visit.     Review of Systems : See HPI for pertinent positives and negatives.  Physical  Examination  Vitals:   06/01/17 1201 06/01/17 1202  BP: 124/71 134/80  Pulse: 73   Resp: 20   Temp: (!) 97.2 F (36.2 C)   TempSrc: Oral   SpO2: 95%   Weight: 150 lb (68 kg)   Height: 5\' 9"  (1.753 m)    Body mass index is 22.15 kg/m.  General: WDWN elderly malein NAD GAIT:slow and  deliberate, using cane Eyes: PERRLA Pulmonary: Respirations are non-labored, limited air movement in all fields, + rales in both bases, + moist sounding respirations. No cough  Cardiac: Irregularrhythm, controlled rate, nodetected murmur.  VASCULAR EXAM Carotid Bruits Right Left   Negative Negative   Abdominal aortic pulse is notpalpable. Radial pulses are 2+palpable and equal.   LE Pulses Right Left  FEMORAL 2+palpable 2+palpable   POPLITEAL 2+palpable  1+palpable  POSTERIOR TIBIAL 1+palpable  1+palpable   DORSALIS PEDIS ANTERIOR TIBIAL 2+palpable  2+ palpable     Gastrointestinal: soft, nontender, BS WNL, no r/g, nopalpable masses. Musculoskeletal: Age approriatemuscle atrophy/wasting. M/S 5/5 throughout, extremities without ischemic changes. Neurologic: A&O X 3; Appropriate Affect, speech is normal, edentulous, CN 2-12 intact except is very hard of hearing, pain and light touch intact in extremities, Motor exam as listed above Skin: No rashes, no ulcers, no cellulitis.   Psychiatric: Normal thought content, mood appropriate to clinical situation.    Assessment: HAMP MORELAND is a 84 y.o. male who is s/p left carotid endarterectomy on 05/24/2015. He had several TIA's preoperatively, nonesince the left CEA. The TIA's manifested as speech difficulties and transient right hemiparesis.  He has a prominent right popliteal pulse; no popliteal artery aneurysms on duplex (08-22-16).   His atherosclerotic risk factors include smoker x 30 years until 1978, CAD, and atrial fib. He takes Eliquis,  ASA, and a statin. Fortunately he does not have DM.    DATA Carotid Duplex (06/01/17): Bilateral ICA with 1-39% stenosis Bilateral vertebral artery flow is antegrade.  Bilateral subclavian artery waveforms are normal.  Decreased category of stenosis in the left ICA and no significant stenosis in the right ECA compared to the exam on 08-22-16.  Bilateral Popliteal Artery Duplex (08/22/16): No aneurysmal dilation.    Plan: Follow-up in 1 yearwith Carotid Duplex scan.  I discussed in depth with the patient the nature of atherosclerosis, and emphasized the importance of maximal medical management including strict control of blood pressure, blood glucose, and lipid levels, obtaining regular exercise, and continued cessation of smoking.  The patient is aware that without maximal medical management the underlying atherosclerotic disease process will progress, limiting the benefit of any interventions. The patient was given information about stroke prevention and what symptoms should prompt the patient to seek immediate medical care. Thank you for allowing [place] to participate in this patient's care.  Clemon Chambers, RN, MSN, FNP-C [facility] Office: [phone]  Clinic Physician: Early on call  06/01/17 12:23 PM

## 2017-06-02 DIAGNOSIS — E559 Vitamin D deficiency, unspecified: Secondary | ICD-10-CM | POA: Diagnosis not present

## 2017-06-03 DIAGNOSIS — R194 Change in bowel habit: Secondary | ICD-10-CM | POA: Diagnosis not present

## 2017-06-03 DIAGNOSIS — I509 Heart failure, unspecified: Secondary | ICD-10-CM | POA: Diagnosis not present

## 2017-06-03 DIAGNOSIS — K5900 Constipation, unspecified: Secondary | ICD-10-CM | POA: Diagnosis not present

## 2017-06-03 DIAGNOSIS — R635 Abnormal weight gain: Secondary | ICD-10-CM | POA: Diagnosis not present

## 2017-06-10 DIAGNOSIS — R0602 Shortness of breath: Secondary | ICD-10-CM | POA: Diagnosis not present

## 2017-06-10 DIAGNOSIS — D649 Anemia, unspecified: Secondary | ICD-10-CM | POA: Diagnosis not present

## 2017-06-10 DIAGNOSIS — R5383 Other fatigue: Secondary | ICD-10-CM | POA: Diagnosis not present

## 2017-06-10 DIAGNOSIS — I10 Essential (primary) hypertension: Secondary | ICD-10-CM | POA: Diagnosis not present

## 2017-06-25 DIAGNOSIS — R0989 Other specified symptoms and signs involving the circulatory and respiratory systems: Secondary | ICD-10-CM | POA: Diagnosis not present

## 2017-06-25 DIAGNOSIS — R05 Cough: Secondary | ICD-10-CM | POA: Diagnosis not present

## 2017-06-25 DIAGNOSIS — R5381 Other malaise: Secondary | ICD-10-CM | POA: Diagnosis not present

## 2017-06-30 DIAGNOSIS — R0981 Nasal congestion: Secondary | ICD-10-CM | POA: Diagnosis not present

## 2017-06-30 DIAGNOSIS — R51 Headache: Secondary | ICD-10-CM | POA: Diagnosis not present

## 2017-06-30 DIAGNOSIS — J019 Acute sinusitis, unspecified: Secondary | ICD-10-CM | POA: Diagnosis not present

## 2017-07-04 DIAGNOSIS — K562 Volvulus: Secondary | ICD-10-CM | POA: Diagnosis not present

## 2017-07-08 DIAGNOSIS — J4540 Moderate persistent asthma, uncomplicated: Secondary | ICD-10-CM | POA: Diagnosis not present

## 2017-07-08 DIAGNOSIS — G4733 Obstructive sleep apnea (adult) (pediatric): Secondary | ICD-10-CM | POA: Diagnosis not present

## 2017-07-08 DIAGNOSIS — J84112 Idiopathic pulmonary fibrosis: Secondary | ICD-10-CM | POA: Diagnosis not present

## 2017-07-14 DIAGNOSIS — R54 Age-related physical debility: Secondary | ICD-10-CM | POA: Diagnosis not present

## 2017-07-14 DIAGNOSIS — I509 Heart failure, unspecified: Secondary | ICD-10-CM | POA: Diagnosis not present

## 2017-07-14 DIAGNOSIS — I2510 Atherosclerotic heart disease of native coronary artery without angina pectoris: Secondary | ICD-10-CM | POA: Diagnosis not present

## 2017-07-14 DIAGNOSIS — R0902 Hypoxemia: Secondary | ICD-10-CM | POA: Diagnosis not present

## 2017-07-28 DIAGNOSIS — J8410 Pulmonary fibrosis, unspecified: Secondary | ICD-10-CM | POA: Diagnosis not present

## 2017-07-28 DIAGNOSIS — J4540 Moderate persistent asthma, uncomplicated: Secondary | ICD-10-CM | POA: Diagnosis not present

## 2017-07-28 DIAGNOSIS — G4733 Obstructive sleep apnea (adult) (pediatric): Secondary | ICD-10-CM | POA: Diagnosis not present

## 2017-08-02 DIAGNOSIS — Z8673 Personal history of transient ischemic attack (TIA), and cerebral infarction without residual deficits: Secondary | ICD-10-CM | POA: Diagnosis not present

## 2017-08-02 DIAGNOSIS — I252 Old myocardial infarction: Secondary | ICD-10-CM | POA: Diagnosis not present

## 2017-08-02 DIAGNOSIS — J189 Pneumonia, unspecified organism: Secondary | ICD-10-CM | POA: Diagnosis not present

## 2017-08-02 DIAGNOSIS — I4891 Unspecified atrial fibrillation: Secondary | ICD-10-CM | POA: Diagnosis not present

## 2017-08-02 DIAGNOSIS — R112 Nausea with vomiting, unspecified: Secondary | ICD-10-CM | POA: Diagnosis not present

## 2017-08-02 DIAGNOSIS — R531 Weakness: Secondary | ICD-10-CM | POA: Diagnosis not present

## 2017-08-02 DIAGNOSIS — Z888 Allergy status to other drugs, medicaments and biological substances status: Secondary | ICD-10-CM | POA: Diagnosis not present

## 2017-08-02 DIAGNOSIS — E7800 Pure hypercholesterolemia, unspecified: Secondary | ICD-10-CM | POA: Diagnosis not present

## 2017-08-02 DIAGNOSIS — Z79899 Other long term (current) drug therapy: Secondary | ICD-10-CM | POA: Diagnosis not present

## 2017-08-02 DIAGNOSIS — K219 Gastro-esophageal reflux disease without esophagitis: Secondary | ICD-10-CM | POA: Diagnosis not present

## 2017-08-02 DIAGNOSIS — J181 Lobar pneumonia, unspecified organism: Secondary | ICD-10-CM | POA: Diagnosis not present

## 2017-08-02 DIAGNOSIS — Z7982 Long term (current) use of aspirin: Secondary | ICD-10-CM | POA: Diagnosis not present

## 2017-08-02 DIAGNOSIS — I2510 Atherosclerotic heart disease of native coronary artery without angina pectoris: Secondary | ICD-10-CM | POA: Diagnosis not present

## 2017-08-02 DIAGNOSIS — I130 Hypertensive heart and chronic kidney disease with heart failure and stage 1 through stage 4 chronic kidney disease, or unspecified chronic kidney disease: Secondary | ICD-10-CM | POA: Diagnosis not present

## 2017-08-02 DIAGNOSIS — R05 Cough: Secondary | ICD-10-CM | POA: Diagnosis not present

## 2017-08-02 DIAGNOSIS — N183 Chronic kidney disease, stage 3 (moderate): Secondary | ICD-10-CM | POA: Diagnosis not present

## 2017-08-02 DIAGNOSIS — I509 Heart failure, unspecified: Secondary | ICD-10-CM | POA: Diagnosis not present

## 2017-08-02 DIAGNOSIS — R110 Nausea: Secondary | ICD-10-CM | POA: Diagnosis not present

## 2017-08-02 DIAGNOSIS — R42 Dizziness and giddiness: Secondary | ICD-10-CM | POA: Diagnosis not present

## 2017-08-02 DIAGNOSIS — Z951 Presence of aortocoronary bypass graft: Secondary | ICD-10-CM | POA: Diagnosis not present

## 2017-08-04 DIAGNOSIS — R05 Cough: Secondary | ICD-10-CM | POA: Diagnosis not present

## 2017-08-04 DIAGNOSIS — J189 Pneumonia, unspecified organism: Secondary | ICD-10-CM | POA: Diagnosis not present

## 2017-08-04 DIAGNOSIS — R112 Nausea with vomiting, unspecified: Secondary | ICD-10-CM | POA: Diagnosis not present

## 2017-08-04 DIAGNOSIS — R42 Dizziness and giddiness: Secondary | ICD-10-CM | POA: Diagnosis not present

## 2017-08-17 DIAGNOSIS — R918 Other nonspecific abnormal finding of lung field: Secondary | ICD-10-CM | POA: Diagnosis not present

## 2017-08-17 DIAGNOSIS — I700 Atherosclerosis of aorta: Secondary | ICD-10-CM | POA: Diagnosis not present

## 2017-08-17 DIAGNOSIS — J432 Centrilobular emphysema: Secondary | ICD-10-CM | POA: Diagnosis not present

## 2017-08-17 DIAGNOSIS — I517 Cardiomegaly: Secondary | ICD-10-CM | POA: Diagnosis not present

## 2017-08-17 DIAGNOSIS — J8410 Pulmonary fibrosis, unspecified: Secondary | ICD-10-CM | POA: Diagnosis not present

## 2017-08-17 DIAGNOSIS — I25810 Atherosclerosis of coronary artery bypass graft(s) without angina pectoris: Secondary | ICD-10-CM | POA: Diagnosis not present

## 2017-08-17 DIAGNOSIS — N200 Calculus of kidney: Secondary | ICD-10-CM | POA: Diagnosis not present

## 2017-08-24 DIAGNOSIS — R05 Cough: Secondary | ICD-10-CM | POA: Diagnosis not present

## 2017-08-24 DIAGNOSIS — I4891 Unspecified atrial fibrillation: Secondary | ICD-10-CM | POA: Diagnosis not present

## 2017-08-24 DIAGNOSIS — R0989 Other specified symptoms and signs involving the circulatory and respiratory systems: Secondary | ICD-10-CM | POA: Diagnosis not present

## 2017-08-24 DIAGNOSIS — I2510 Atherosclerotic heart disease of native coronary artery without angina pectoris: Secondary | ICD-10-CM | POA: Diagnosis not present

## 2017-08-28 ENCOUNTER — Ambulatory Visit: Payer: PPO | Admitting: Cardiology

## 2017-08-28 NOTE — Progress Notes (Deleted)
Cardiology Office Note:    Date:  08/28/2017   ID:  Dave Harris, DOB 12/20/1929, MRN [id number]  PCP:  Garwin Brothers, MD  Cardiologist:  Shirlee More, MD   Referring MD: Garwin Brothers, MD  ASSESSMENT:    No diagnosis found. PLAN:    In order of problems listed above:  1. ***  Next appointment   Medication Adjustments/Labs and Tests Ordered: Current medicines are reviewed at length with the patient today.  Concerns regarding medicines are outlined above.  No orders of the defined types were placed in this encounter.  No orders of the defined types were placed in this encounter.    No chief complaint on file. ***  History of Present Illness:    Dave Harris is a 84 y.o. male with a history of CAD coronary artery bypass surgery carotid stenosis of left carotid endarterectomy hypertension hyperlipidemia atrial fibrillation and stroke anticoagulated when seen at [facility] cardiology 2017 who is being seen today for the evaluation of *** at the request of Garwin Brothers, MD.   Past Medical History:  Diagnosis Date  . A-fib ([place]) 06/10/2012  . Allergy   . Bilateral carotid artery stenosis 09/03/2016  . Bruit 06/10/2012  . CAD (coronary artery disease)   . Carotid stenosis   . Cerebrovascular accident (CVA) due to embolism of left middle cerebral artery ([place]) 08/22/2015  . CHF (congestive heart failure) ([place])   . Chronic anticoagulation 08/22/2015  . Chronic atrial fibrillation ([place])   . Complication of anesthesia    "sometimes I'm hard to wake up (05/23/2015)  . Coronary artery disease involving native coronary artery of native heart with angina pectoris ([place]) 07/21/2014  . CVA (cerebral infarction) 05/22/2015  . Depression   . Essential hypertension 07/21/2014  . Grief reaction 12/11/2015  . Heart disease   . Heart disease   . History of blood transfusion    "w/my heart OR"  . History of stomach ulcers "sever"  . HLD (hyperlipidemia)   . Hyperlipidemia   .  Hypertension   . Myocardial infarction ([place])   . Pure hypercholesterolemia 07/21/2014  . S/P carotid endarterectomy 06/19/2015  . Stroke [facility]) several   "6 since June 2016; last one was 05/19/2015"; denies residual  (05/23/2015)    Past Surgical History:  Procedure Laterality Date  . BACK SURGERY    . CARDIAC CATHETERIZATION    . CARPAL TUNNEL RELEASE Bilateral   . CATARACT EXTRACTION W/ INTRAOCULAR LENS  IMPLANT, BILATERAL Bilateral   . COLON SURGERY    . CORONARY ARTERY BYPASS GRAFT  1978   CABG X"?3"  . ENDARTERECTOMY Left 05/24/2015   Procedure: Left ENDARTERECTOMY CAROTID;  Surgeon: Angelia Mould, MD;  Location: [facility];  Service: Vascular;  Laterality: Left;  . LUMBAR DISC SURGERY     "had 4 ruptured discs"  . MULTIPLE TOOTH EXTRACTIONS    . STOMACH SURGERY     "had 1/2 my stomach removed; had several stomach ulcers"  . TONSILLECTOMY AND ADENOIDECTOMY  ~ 1947  . TRANSURETHRAL RESECTION OF PROSTATE  X 3   "never removed it" (05/22/2015)    Current Medications: No outpatient medications have been marked as taking for the 08/28/17 encounter (Appointment) with Richardo Priest, MD.     Allergies:   Plavix [clopidogrel bisulfate] and Xarelto [rivaroxaban]   Social History   Socioeconomic History  . Marital status: Widowed    Spouse name: Not on file  . Number of children: Not on file  .  Years of education: Not on file  . Highest education level: Not on file  Occupational History  . Not on file  Social Needs  . Financial resource strain: Not on file  . Food insecurity:    Worry: Not on file    Inability: Not on file  . Transportation needs:    Medical: Not on file    Non-medical: Not on file  Tobacco Use  . Smoking status: Former Smoker    Packs/day: 2.00    Years: 30.00    Pack years: 60.00    Types: Cigarettes  . Smokeless tobacco: Never Used  . Tobacco comment: "quit smoking cigarettes in 1978 when I had my heart surgery"  Substance and Sexual Activity  .  Alcohol use: No    Alcohol/week: 0.0 oz  . Drug use: No  . Sexual activity: Not Currently  Lifestyle  . Physical activity:    Days per week: Not on file    Minutes per session: Not on file  . Stress: Not on file  Relationships  . Social connections:    Talks on phone: Not on file    Gets together: Not on file    Attends religious service: Not on file    Active member of club or organization: Not on file    Attends meetings of clubs or organizations: Not on file    Relationship status: Not on file  Other Topics Concern  . Not on file  Social History Narrative  . Not on file     Family History: The patient's ***family history includes CAD in his unknown relative; Heart attack in his brother; Heart disease in his brother.  ROS:   ROS Please see the history of present illness.    *** All other systems reviewed and are negative.  EKGs/Labs/Other Studies Reviewed:    The following studies were reviewed today: ***  EKG:  EKG is *** ordered today.  The ekg ordered today demonstrates ***  Recent Labs: No results found for requested labs within last 8760 hours.  Recent Lipid Panel    Component Value Date/Time   CHOL 132 05/23/2015 0449   TRIG 60 05/23/2015 0449   HDL 46 05/23/2015 0449   CHOLHDL 2.9 05/23/2015 0449   VLDL 12 05/23/2015 0449   LDLCALC 74 05/23/2015 0449    Physical Exam:    VS:  There were no vitals taken for this visit.    Wt Readings from Last 3 Encounters:  06/01/17 150 lb (68 kg)  09/02/16 155 lb 3.2 oz (70.4 kg)  08/22/16 155 lb (70.3 kg)     GEN: *** Well nourished, well developed in no acute distress HEENT: Normal NECK: No JVD; No carotid bruits LYMPHATICS: No lymphadenopathy CARDIAC: ***RRR, no murmurs, rubs, gallops RESPIRATORY:  Clear to auscultation without rales, wheezing or rhonchi  ABDOMEN: Soft, non-tender, non-distended MUSCULOSKELETAL:  No edema; No deformity  SKIN: Warm and dry NEUROLOGIC:  Alert and oriented x  3 PSYCHIATRIC:  Normal affect     Signed, Shirlee More, MD  08/28/2017 6:50 AM    Dave Harris

## 2017-09-09 DIAGNOSIS — I252 Old myocardial infarction: Secondary | ICD-10-CM | POA: Diagnosis not present

## 2017-09-09 DIAGNOSIS — I10 Essential (primary) hypertension: Secondary | ICD-10-CM | POA: Diagnosis not present

## 2017-09-09 DIAGNOSIS — I2510 Atherosclerotic heart disease of native coronary artery without angina pectoris: Secondary | ICD-10-CM | POA: Diagnosis not present

## 2017-09-09 DIAGNOSIS — J449 Chronic obstructive pulmonary disease, unspecified: Secondary | ICD-10-CM | POA: Diagnosis not present

## 2017-10-01 NOTE — Progress Notes (Deleted)
Cardiology Office Note:    Date:  10/01/2017   ID:  Dave Harris, DOB 1929/04/02, MRN [id number]  PCP:  Garwin Brothers, MD  Cardiologist:  Shirlee More, MD    Referring MD: Garwin Brothers, MD    ASSESSMENT:    No diagnosis found. PLAN:    In order of problems listed above:  1. ***   Next appointment: ***   Medication Adjustments/Labs and Tests Ordered: Current medicines are reviewed at length with the patient today.  Concerns regarding medicines are outlined above.  No orders of the defined types were placed in this encounter.  No orders of the defined types were placed in this encounter.   No chief complaint on file.   History of Present Illness:    Dave Harris is a 84 y.o. male with a hx of coronary artery disease hypertension hyperlipidemia and a history of carotid endarterectomy last seen 06/19/15 by Dr Geraldo Pitter.  Record review shows that in June 2016 he was admitted to the hospital non-ST segment elevation MI left heart cath showed patent grafts after bypass surgery was treated medically, at that time left ventricular ejection fraction was normal 60 to 65%.  He had chronic atrial fibrillation low to intermediate risk score for stroke chads vascular of 2 and was not anticoagulated because of a history of GI bleed and patient's choice.  He has a history of stroke left brain with left internal carotid artery stenosis 05/22/2015 and underwent left carotid endarterectomy at that time he was placed on low-dose Eliquis for stroke prophylaxis with atrial fibrillation Compliance with diet, lifestyle and medications: *** Past Medical History:  Diagnosis Date  . A-fib ([place]) 06/10/2012  . Allergy   . Bilateral carotid artery stenosis 09/03/2016  . Bruit 06/10/2012  . CAD (coronary artery disease)   . Carotid stenosis   . Cerebrovascular accident (CVA) due to embolism of left middle cerebral artery ([place]) 08/22/2015  . CHF (congestive heart failure) ([place])   . Chronic  anticoagulation 08/22/2015  . Chronic atrial fibrillation ([place])   . Complication of anesthesia    "sometimes I'm hard to wake up (05/23/2015)  . Coronary artery disease involving native coronary artery of native heart with angina pectoris ([place]) 07/21/2014  . CVA (cerebral infarction) 05/22/2015  . Depression   . Essential hypertension 07/21/2014  . Grief reaction 12/11/2015  . Heart disease   . Heart disease   . History of blood transfusion    "w/my heart OR"  . History of stomach ulcers "sever"  . HLD (hyperlipidemia)   . Hyperlipidemia   . Hypertension   . Myocardial infarction ([place])   . Pure hypercholesterolemia 07/21/2014  . S/P carotid endarterectomy 06/19/2015  . Stroke [facility]) several   "6 since June 2016; last one was 05/19/2015"; denies residual  (05/23/2015)    Past Surgical History:  Procedure Laterality Date  . BACK SURGERY    . CARDIAC CATHETERIZATION    . CARPAL TUNNEL RELEASE Bilateral   . CATARACT EXTRACTION W/ INTRAOCULAR LENS  IMPLANT, BILATERAL Bilateral   . COLON SURGERY    . CORONARY ARTERY BYPASS GRAFT  1978   CABG X"?3"  . ENDARTERECTOMY Left 05/24/2015   Procedure: Left ENDARTERECTOMY CAROTID;  Surgeon: Angelia Mould, MD;  Location: [place];  Service: Vascular;  Laterality: Left;  . LUMBAR DISC SURGERY     "had 4 ruptured discs"  . MULTIPLE TOOTH EXTRACTIONS    . STOMACH SURGERY     "had 1/2 my stomach removed;  had several stomach ulcers"  . TONSILLECTOMY AND ADENOIDECTOMY  ~ 1947  . TRANSURETHRAL RESECTION OF PROSTATE  X 3   "never removed it" (05/22/2015)    Current Medications: No outpatient medications have been marked as taking for the 10/02/17 encounter (Appointment) with Richardo Priest, MD.     Allergies:   Plavix [clopidogrel bisulfate] and Xarelto [rivaroxaban]   Social History   Socioeconomic History  . Marital status: Widowed    Spouse name: Not on file  . Number of children: Not on file  . Years of education: Not on file  . Highest  education level: Not on file  Occupational History  . Not on file  Social Needs  . Financial resource strain: Not on file  . Food insecurity:    Worry: Not on file    Inability: Not on file  . Transportation needs:    Medical: Not on file    Non-medical: Not on file  Tobacco Use  . Smoking status: Former Smoker    Packs/day: 2.00    Years: 30.00    Pack years: 60.00    Types: Cigarettes  . Smokeless tobacco: Never Used  . Tobacco comment: "quit smoking cigarettes in 1978 when I had my heart surgery"  Substance and Sexual Activity  . Alcohol use: No    Alcohol/week: 0.0 standard drinks  . Drug use: No  . Sexual activity: Not Currently  Lifestyle  . Physical activity:    Days per week: Not on file    Minutes per session: Not on file  . Stress: Not on file  Relationships  . Social connections:    Talks on phone: Not on file    Gets together: Not on file    Attends religious service: Not on file    Active member of club or organization: Not on file    Attends meetings of clubs or organizations: Not on file    Relationship status: Not on file  Other Topics Concern  . Not on file  Social History Narrative  . Not on file     Family History: The patient's ***family history includes CAD in his unknown relative; Heart attack in his brother; Heart disease in his brother. ROS:   Please see the history of present illness.    All other systems reviewed and are negative.  EKGs/Labs/Other Studies Reviewed:    The following studies were reviewed today:  EKG:  EKG ordered today.  The ekg ordered today demonstrates ***  Recent Labs: No results found for requested labs within last 8760 hours.  Recent Lipid Panel    Component Value Date/Time   CHOL 132 05/23/2015 0449   TRIG 60 05/23/2015 0449   HDL 46 05/23/2015 0449   CHOLHDL 2.9 05/23/2015 0449   VLDL 12 05/23/2015 0449   LDLCALC 74 05/23/2015 0449    Physical Exam:    VS:  There were no vitals taken for this  visit.    Wt Readings from Last 3 Encounters:  06/01/17 150 lb (68 kg)  09/02/16 155 lb 3.2 oz (70.4 kg)  08/22/16 155 lb (70.3 kg)     GEN: *** Well nourished, well developed in no acute distress HEENT: Normal NECK: No JVD; No carotid bruits LYMPHATICS: No lymphadenopathy CARDIAC: ***RRR, no murmurs, rubs, gallops RESPIRATORY:  Clear to auscultation without rales, wheezing or rhonchi  ABDOMEN: Soft, non-tender, non-distended MUSCULOSKELETAL:  No edema; No deformity  SKIN: Warm and dry NEUROLOGIC:  Alert and oriented x 3 PSYCHIATRIC:  Normal affect    Signed, Shirlee More, MD  10/01/2017 2:49 PM    [facility]

## 2017-10-02 ENCOUNTER — Ambulatory Visit: Payer: PPO | Admitting: Cardiology

## 2017-10-19 DIAGNOSIS — R404 Transient alteration of awareness: Secondary | ICD-10-CM | POA: Diagnosis not present

## 2017-10-19 DIAGNOSIS — J690 Pneumonitis due to inhalation of food and vomit: Secondary | ICD-10-CM | POA: Diagnosis not present

## 2017-10-19 DIAGNOSIS — R6521 Severe sepsis with septic shock: Secondary | ICD-10-CM | POA: Diagnosis not present

## 2017-10-19 DIAGNOSIS — Z87891 Personal history of nicotine dependence: Secondary | ICD-10-CM | POA: Diagnosis not present

## 2017-10-19 DIAGNOSIS — Z888 Allergy status to other drugs, medicaments and biological substances status: Secondary | ICD-10-CM | POA: Diagnosis not present

## 2017-10-19 DIAGNOSIS — K219 Gastro-esophageal reflux disease without esophagitis: Secondary | ICD-10-CM | POA: Diagnosis not present

## 2017-10-19 DIAGNOSIS — N400 Enlarged prostate without lower urinary tract symptoms: Secondary | ICD-10-CM | POA: Diagnosis not present

## 2017-10-19 DIAGNOSIS — Z79899 Other long term (current) drug therapy: Secondary | ICD-10-CM | POA: Diagnosis not present

## 2017-10-19 DIAGNOSIS — I639 Cerebral infarction, unspecified: Secondary | ICD-10-CM | POA: Diagnosis not present

## 2017-10-19 DIAGNOSIS — Z8673 Personal history of transient ischemic attack (TIA), and cerebral infarction without residual deficits: Secondary | ICD-10-CM | POA: Diagnosis not present

## 2017-10-19 DIAGNOSIS — Z66 Do not resuscitate: Secondary | ICD-10-CM | POA: Diagnosis not present

## 2017-10-19 DIAGNOSIS — J189 Pneumonia, unspecified organism: Secondary | ICD-10-CM | POA: Diagnosis not present

## 2017-10-19 DIAGNOSIS — R0689 Other abnormalities of breathing: Secondary | ICD-10-CM | POA: Diagnosis not present

## 2017-10-19 DIAGNOSIS — R627 Adult failure to thrive: Secondary | ICD-10-CM | POA: Diagnosis not present

## 2017-10-19 DIAGNOSIS — I482 Chronic atrial fibrillation: Secondary | ICD-10-CM | POA: Diagnosis not present

## 2017-10-19 DIAGNOSIS — R0902 Hypoxemia: Secondary | ICD-10-CM | POA: Diagnosis not present

## 2017-10-19 DIAGNOSIS — Z8679 Personal history of other diseases of the circulatory system: Secondary | ICD-10-CM | POA: Diagnosis not present

## 2017-10-19 DIAGNOSIS — I2510 Atherosclerotic heart disease of native coronary artery without angina pectoris: Secondary | ICD-10-CM | POA: Diagnosis not present

## 2017-10-19 DIAGNOSIS — A419 Sepsis, unspecified organism: Secondary | ICD-10-CM | POA: Diagnosis not present

## 2017-10-19 DIAGNOSIS — I5032 Chronic diastolic (congestive) heart failure: Secondary | ICD-10-CM | POA: Diagnosis not present

## 2017-10-19 DIAGNOSIS — N183 Chronic kidney disease, stage 3 (moderate): Secondary | ICD-10-CM | POA: Diagnosis not present

## 2017-10-19 DIAGNOSIS — R112 Nausea with vomiting, unspecified: Secondary | ICD-10-CM | POA: Diagnosis not present

## 2017-10-19 DIAGNOSIS — I110 Hypertensive heart disease with heart failure: Secondary | ICD-10-CM | POA: Diagnosis not present

## 2017-10-19 DIAGNOSIS — E7800 Pure hypercholesterolemia, unspecified: Secondary | ICD-10-CM | POA: Diagnosis not present

## 2017-10-19 DIAGNOSIS — I491 Atrial premature depolarization: Secondary | ICD-10-CM | POA: Diagnosis not present

## 2017-10-19 DIAGNOSIS — I10 Essential (primary) hypertension: Secondary | ICD-10-CM | POA: Diagnosis not present

## 2017-10-19 DIAGNOSIS — R1084 Generalized abdominal pain: Secondary | ICD-10-CM | POA: Diagnosis not present

## 2017-10-19 DIAGNOSIS — I130 Hypertensive heart and chronic kidney disease with heart failure and stage 1 through stage 4 chronic kidney disease, or unspecified chronic kidney disease: Secondary | ICD-10-CM | POA: Diagnosis not present

## 2017-10-19 DIAGNOSIS — R0602 Shortness of breath: Secondary | ICD-10-CM | POA: Diagnosis not present

## 2017-10-19 DIAGNOSIS — Z7982 Long term (current) use of aspirin: Secondary | ICD-10-CM | POA: Diagnosis not present

## 2017-10-19 DIAGNOSIS — J9601 Acute respiratory failure with hypoxia: Secondary | ICD-10-CM | POA: Diagnosis not present

## 2017-10-19 DIAGNOSIS — Z951 Presence of aortocoronary bypass graft: Secondary | ICD-10-CM | POA: Diagnosis not present

## 2017-10-19 DIAGNOSIS — I252 Old myocardial infarction: Secondary | ICD-10-CM | POA: Diagnosis not present

## 2017-10-20 DIAGNOSIS — A419 Sepsis, unspecified organism: Secondary | ICD-10-CM

## 2017-10-21 DIAGNOSIS — J189 Pneumonia, unspecified organism: Secondary | ICD-10-CM | POA: Diagnosis not present

## 2017-10-21 DIAGNOSIS — A419 Sepsis, unspecified organism: Secondary | ICD-10-CM | POA: Diagnosis not present

## 2017-10-22 ENCOUNTER — Other Ambulatory Visit: Payer: Self-pay

## 2017-10-22 DIAGNOSIS — J9601 Acute respiratory failure with hypoxia: Secondary | ICD-10-CM | POA: Diagnosis not present

## 2017-10-22 DIAGNOSIS — A419 Sepsis, unspecified organism: Secondary | ICD-10-CM | POA: Diagnosis not present

## 2017-10-22 DIAGNOSIS — J189 Pneumonia, unspecified organism: Secondary | ICD-10-CM | POA: Diagnosis not present

## 2017-10-22 DIAGNOSIS — I480 Paroxysmal atrial fibrillation: Secondary | ICD-10-CM | POA: Diagnosis not present

## 2017-10-22 NOTE — Patient Outreach (Signed)
[facility]) Care Management  10/22/2017  DANYL DEEMS Jan 30, 1930 [id number]     Transition of Care Referral  Referral Date: 10/22/17 Referral Source: [facility]TA Discharge Report Date of Admission: unknown Diagnosis: unknown Date of Discharge: 10/21/17 Facility: [place]: HTA    Outreach attempt # 1 to patient. Main number listed for patient is number for retirement community. RN CM attempted alternate number and voicemail message left.     Plan: RN CM will make outreach attempt to patient within 3-4 business days. RN CM will send unsuccessful outreach letter to patient.   Enzo Montgomery, RN,BSN,CCM [place] Management Telephonic Care Management Coordinator Direct Phone: [phone] Toll Free: [phone] Fax: [phone]

## 2017-10-23 ENCOUNTER — Other Ambulatory Visit: Payer: Self-pay

## 2017-10-23 DIAGNOSIS — Z23 Encounter for immunization: Secondary | ICD-10-CM | POA: Diagnosis not present

## 2017-10-23 NOTE — Patient Outreach (Signed)
[facility]) Care Management  10/23/2017  RAHM MINIX 1929-10-28 [id number]     Transition of Care Referral  Referral Date: 10/22/17 Referral Source: HTA Discharge Report Date of Admission: unknown Diagnosis: unknown Date of Discharge: 10/21/17 Facility: [facility]: HTA   Outreach attempt #2 to patient. No answer at present.     Plan: RN CM will make outreach attempt to patient within 3-4 business days.  Enzo Montgomery, RN,BSN,CCM [place] Management Telephonic Care Management Coordinator Direct Phone: [phone] Toll Free: [phone] Fax: [phone]

## 2017-10-26 ENCOUNTER — Other Ambulatory Visit: Payer: Self-pay

## 2017-10-26 NOTE — Patient Outreach (Signed)
[facility]) Care Management  10/26/2017  Dave Harris 11/03/1929 [id number]    Transition of Care Referral  Referral Date:10/22/17 Referral Source:HTA Discharge Report Date of Admission:unknown Diagnosis:unknown Date of Discharge:10/21/17 Facility:[facility] Insurance:HTA   Outreach attempt # to patient. No answer at present.      Plan: RN CM will close case if no response from letter mailed to patient.    Enzo Montgomery, RN,BSN,CCM [place] Management Telephonic Care Management Coordinator Direct Phone: [phone] Toll Free: [phone] Fax: [phone]

## 2017-11-04 ENCOUNTER — Other Ambulatory Visit: Payer: Self-pay

## 2017-11-04 NOTE — Patient Outreach (Signed)
[facility]) Care Management  11/04/2017  SHANT HENCE [birth year] [id number]     Transition of Care Referral  Referral Date:10/22/17 Referral Source:HTA Discharge Report Date of Admission:unknown Diagnosis:unknown Date of Discharge:10/21/17 Facility:[facility] Insurance:HTA   Multiple attempts to establish contact with patient without success. No response from letter mailed to patient. Case is being closed at this time.    Plan: RN CM will close case at this time.   Enzo Montgomery, RN,BSN,CCM [facility] Telephonic Care Management Coordinator Direct Phone: [phone] Toll Free: [phone] Fax: [phone]

## 2017-11-05 ENCOUNTER — Ambulatory Visit (INDEPENDENT_AMBULATORY_CARE_PROVIDER_SITE_OTHER): Payer: PPO | Admitting: Cardiology

## 2017-11-05 VITALS — BP 132/80 | HR 73 | Ht 68.0 in | Wt 146.0 lb

## 2017-11-05 DIAGNOSIS — I482 Chronic atrial fibrillation, unspecified: Secondary | ICD-10-CM

## 2017-11-05 DIAGNOSIS — I110 Hypertensive heart disease with heart failure: Secondary | ICD-10-CM

## 2017-11-05 DIAGNOSIS — Z951 Presence of aortocoronary bypass graft: Secondary | ICD-10-CM | POA: Diagnosis not present

## 2017-11-05 DIAGNOSIS — Z7901 Long term (current) use of anticoagulants: Secondary | ICD-10-CM | POA: Diagnosis not present

## 2017-11-05 DIAGNOSIS — E785 Hyperlipidemia, unspecified: Secondary | ICD-10-CM | POA: Diagnosis not present

## 2017-11-05 DIAGNOSIS — I25119 Atherosclerotic heart disease of native coronary artery with unspecified angina pectoris: Secondary | ICD-10-CM | POA: Diagnosis not present

## 2017-11-05 NOTE — Patient Instructions (Signed)
Medication Instructions:  Your physician recommends that you continue on your current medications as directed. Please refer to the Current Medication list given to you today.   Labwork: Your physician recommends that you return for lab work today: CMP, lipid panel.   Testing/Procedures: You had an EKG today.   Follow-Up: Your physician wants you to follow-up in: 6 months. You will receive a reminder letter in the mail two months in advance. If you don't receive a letter, please call our office to schedule the follow-up appointment.   If you need a refill on your cardiac medications before your next appointment, please call your pharmacy.   Thank you for choosing [facility]! Robyne Peers, RN [phone]

## 2017-11-05 NOTE — Progress Notes (Signed)
Cardiology Office Note:    Date:  11/05/2017   ID:  Dave Harris, DOB [birth year], MRN [id number]  PCP:  Garwin Brothers, MD  Cardiologist:  Shirlee More, MD   Referring MD: Garwin Brothers, MD  ASSESSMENT:    1. Coronary artery disease involving native coronary artery of native heart with angina pectoris ([place])   2. Chronic atrial fibrillation (HCC)   3. Hypertensive heart disease with heart failure ([place])   4. Chronic anticoagulation   5. Hyperlipidemia, unspecified hyperlipidemia type   6. Hx of CABG    PLAN:    In order of problems listed above:  1. CAD is stable chronic angina presently having no chest pain on his medical therapy at this time I do not think he requires an ischemia evaluation and will continue his current medical treatment.  Note he has had bypass surgery remotely 2. Stable atrial fibrillation is rate controlled with the beta-blocker and continue his anticoagulants 3. Stable compensators New York Heart Association class I he has no edema continue his current dose of loop diuretic 4. Continue his anticoagulant he has had no bleeding complication of medications are supervised 5. 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  I reviewed labs available and he has not had a lipid profile since 2017 at which point his LDL was 74. 6. He had an excellent course he said no recurrent coronary ischemia at this time we will continue medical treatment  Next appointment 6 months, minimum as well people of acute renal failure with ACEs and arms I think you are most obligated to do it   Medication Adjustments/Labs and Tests Ordered: Current medicines are reviewed at length with the patient today.  Concerns regarding medicines are outlined above.  Orders Placed This Encounter  Procedures  . Comp Met (CMET)  . Lipid Profile  . EKG 12-Lead   No orders of the defined  types were placed in this encounter.    Chief Complaint  Patient presents with  . Follow-up    to re establish cardiology care  . Coronary Artery Disease  . Atrial Fibrillation  . Congestive Heart Failure  . Hypertension  . Hyperlipidemia  6 months  History of Present Illness:    Dave Harris is a 84 y.o. male who is being seen today to reestablish cardiology care at the request of Garwin Brothers, MD.  He was last seen at [facility] cardiology 06/19/2015 his diagnoses included CAD native coronary artery native heart with angina pectoris, CABG,  Hypertension, chronic diastolic heart failure, hyperlipidemia and carotid disease with previous left carotid endarterectomy.  His last visit details atrial fibrillation and anticoagulant therapy He was admitted to [facility] 10/19/2017 with pneumonia elevated lactic acid and diagnosis of septic shock but he was not hypotensive and during that hospitalization he had no cardiology complications.  His rhythm was rate controlled atrial fibrillation he has a history of heart failure which was not decompensated and he had stable stage III CKD prior to discharge his white count remained elevated 16,800 creatinine 1.40 I reviewed the records there is a single EKG showing rate controlled atrial fibrillation cardiac enzymes and BNP was not assessed chest x-ray showed a left-sided infiltrate and the EKG from 10/19/2017 showed atrial fibrillation with a rapid ventricular response nonspecific ST-T change.  He is struggling since his 2 admissions to the hospital with pneumonia he still finds  himself weak and has a productive cough and wheezing and is improved with his bronchodilator fortunately is had no angina edema orthopnea shortness of breath palpitations syncope or bleeding complication of his anticoagulant.  Past Medical History:  Diagnosis Date  . A-fib ([place]) 06/10/2012  . Allergy   . Bilateral carotid artery stenosis 09/03/2016  . Bruit 06/10/2012    . CAD (coronary artery disease)   . Carotid stenosis   . Cerebrovascular accident (CVA) due to embolism of left middle cerebral artery ([place]) 08/22/2015  . CHF (congestive heart failure) ([place])   . Chronic anticoagulation 08/22/2015  . Chronic atrial fibrillation ([place])   . Complication of anesthesia    "sometimes I'm hard to wake up (05/23/2015)  . Coronary artery disease involving native coronary artery of native heart with angina pectoris ([place]) 07/21/2014  . CVA (cerebral infarction) 05/22/2015  . Depression   . Essential hypertension 07/21/2014  . Grief reaction 12/11/2015  . Heart disease   . Heart disease   . History of blood transfusion    "w/my heart OR"  . History of stomach ulcers "sever"  . HLD (hyperlipidemia)   . Hyperlipidemia   . Hypertension   . Myocardial infarction ([place])   . Pure hypercholesterolemia 07/21/2014  . S/P carotid endarterectomy 06/19/2015  . Stroke [facility]) several   "6 since June 2016; last one was 05/19/2015"; denies residual  (05/23/2015)    Past Surgical History:  Procedure Laterality Date  . BACK SURGERY    . CARDIAC CATHETERIZATION    . CARPAL TUNNEL RELEASE Bilateral   . CATARACT EXTRACTION W/ INTRAOCULAR LENS  IMPLANT, BILATERAL Bilateral   . COLON SURGERY    . CORONARY ARTERY BYPASS GRAFT  1978   CABG X"?3"  . ENDARTERECTOMY Left 05/24/2015   Procedure: Left ENDARTERECTOMY CAROTID;  Surgeon: Angelia Mould, MD;  Location: [place];  Service: Vascular;  Laterality: Left;  . LUMBAR DISC SURGERY     "had 4 ruptured discs"  . MULTIPLE TOOTH EXTRACTIONS    . STOMACH SURGERY     "had 1/2 my stomach removed; had several stomach ulcers"  . TONSILLECTOMY AND ADENOIDECTOMY  ~ 1947  . TRANSURETHRAL RESECTION OF PROSTATE  X 3   "never removed it" (05/22/2015)    Current Medications: Current Meds  Medication Sig  . acetaminophen (TYLENOL) 325 MG tablet Take 650 mg by mouth every 6 (six) hours as needed.  Marland Kitchen apixaban (ELIQUIS) 5 MG TABS tablet Take 5 mg  by mouth 2 (two) times daily.  Marland Kitchen aspirin 81 MG tablet Take 81 mg by mouth daily.  Marland Kitchen atorvastatin (LIPITOR) 40 MG tablet Take 40 mg by mouth daily.   . bisacodyl (DULCOLAX) 10 MG suppository Place 10 mg rectally as needed for moderate constipation.  Marland Kitchen BREO ELLIPTA 100-25 MCG/INH AEPB Take 1 puff by mouth daily.  . carvedilol (COREG) 6.25 MG tablet Take 6.25 mg by mouth 2 (two) times daily with a meal.  . Cholecalciferol (VITAMIN D3) 2000 units TABS Take 1 tablet by mouth daily.   Marland Kitchen Dextromethorphan-Guaifenesin (ROBITUSSIN DM) 10-100 MG/5ML liquid Take 5 mLs by mouth as needed.   . docusate sodium (COLACE) 100 MG capsule Take 100 mg by mouth 2 (two) times daily.   . fexofenadine (ALLEGRA) 180 MG tablet Take 180 mg by mouth daily.  . finasteride (PROSCAR) 5 MG tablet Take 5 mg by mouth daily.  . furosemide (LASIX) 20 MG tablet Take 20 mg by mouth daily.   Marland Kitchen guaiFENesin (MUCINEX) 600 MG  12 hr tablet Take by mouth 2 (two) times daily.  Marland Kitchen HYDROcodone-acetaminophen (NORCO/VICODIN) 5-325 MG tablet Take 1 tablet by mouth daily.   Marland Kitchen ipratropium-albuterol (DUONEB) 0.5-2.5 (3) MG/3ML SOLN Inhale 3 mLs into the lungs QID.  Marland Kitchen LINZESS 145 MCG CAPS capsule Take 145 mcg by mouth once a week.   . loperamide (IMODIUM A-D) 2 MG capsule Take by mouth as needed for diarrhea or loose stools.  . mirtazapine (REMERON) 30 MG tablet Take 30 mg by mouth at bedtime.   . Multiple Vitamins-Minerals (PRESERVISION AREDS PO) Take 1 tablet by mouth every morning.  . nitroGLYCERIN (NITROSTAT) 0.4 MG SL tablet Place 0.4 mg under the tongue every 5 (five) minutes as needed for chest pain.   . pantoprazole (PROTONIX) 40 MG tablet Take 1 tablet (40 mg total) by mouth daily.  Marland Kitchen senna-docusate (SENNA-PLUS) 8.6-50 MG tablet Take 1 tablet by mouth 2 (two) times daily.   . tamsulosin (FLOMAX) 0.4 MG CAPS capsule Take 0.4 mg by mouth daily.   . vitamin B-12 (CYANOCOBALAMIN) 1000 MCG tablet Take 1,000 mcg by mouth daily.      Allergies:    Plavix [clopidogrel bisulfate] and Xarelto [rivaroxaban]   Social History   Socioeconomic History  . Marital status: Widowed    Spouse name: Not on file  . Number of children: Not on file  . Years of education: Not on file  . Highest education level: Not on file  Occupational History  . Not on file  Social Needs  . Financial resource strain: Not on file  . Food insecurity:    Worry: Not on file    Inability: Not on file  . Transportation needs:    Medical: Not on file    Non-medical: Not on file  Tobacco Use  . Smoking status: Former Smoker    Packs/day: 2.00    Years: 30.00    Pack years: 60.00    Types: Cigarettes  . Smokeless tobacco: Never Used  . Tobacco comment: "quit smoking cigarettes in 1978 when I had my heart surgery"  Substance and Sexual Activity  . Alcohol use: No    Alcohol/week: 0.0 standard drinks  . Drug use: No  . Sexual activity: Not Currently  Lifestyle  . Physical activity:    Days per week: Not on file    Minutes per session: Not on file  . Stress: Not on file  Relationships  . Social connections:    Talks on phone: Not on file    Gets together: Not on file    Attends religious service: Not on file    Active member of club or organization: Not on file    Attends meetings of clubs or organizations: Not on file    Relationship status: Not on file  Other Topics Concern  . Not on file  Social History Narrative  . Not on file     Family History: The patient's family history includes CAD in his unknown relative; Heart attack in his brother; Heart disease in his brother.  ROS:   ROS Please see the history of present illness.     All other systems reviewed and are negative.  EKGs/Labs/Other Studies Reviewed:    The following studies were reviewed today:   EKG:  EKG is  ordered today.  The ekg ordered today demonstrates rate controlled atrial fibrillation Carotid artery duplex 06/01/2017 minimal 1 to 39% bilateral stenosis From care  everywhere coronary angiography 0 16 2016 showed a patent left thoracic artery  to LAD patent vein graft to obtuse marginal and ejection fraction of 60 to 70% the indication is non-ST elevation MI and no percutaneous intervention was performed Recent Labs: No results found for requested labs within last 8760 hours.  Recent Lipid Panel    Component Value Date/Time   CHOL 132 05/23/2015 0449   TRIG 60 05/23/2015 0449   HDL 46 05/23/2015 0449   CHOLHDL 2.9 05/23/2015 0449   VLDL 12 05/23/2015 0449   LDLCALC 74 05/23/2015 0449    Physical Exam:    VS:  BP 132/80 (BP Location: Right Arm, Patient Position: Sitting, Cuff Size: Normal)   Pulse 73   Ht _0  (1.727 m)   Wt 146 lb (66.2 kg)   SpO2 94%   BMI 22.20 kg/m     Wt Readings from Last 3 Encounters:  11/05/17 146 lb (66.2 kg)  06/01/17 150 lb (68 kg)  09/02/16 155 lb 3.2 oz (70.4 kg)     GEN:  Well nourished, well developed in no acute distress HEENT: Normal NECK: No JVD; No carotid bruits LYMPHATICS: No lymphadenopathy CARDIAC: IrrIrr no S3 variable s1  no murmurs, rubs, gallops RESPIRATORY:  Clear to auscultation without rales, wheezing or rhonchi  ABDOMEN: Soft, non-tender, non-distended MUSCULOSKELETAL:  No edema; No deformity  SKIN: Warm and dry NEUROLOGIC:  Alert and oriented x 3 PSYCHIATRIC:  Normal affect     Signed, Shirlee More, MD  11/05/2017 2:51 PM    [facility]

## 2017-11-06 ENCOUNTER — Telehealth: Payer: Self-pay

## 2017-11-06 LAB — COMPREHENSIVE METABOLIC PANEL
A/G RATIO: 1.6 (ref 1.2–2.2)
ALT: 20 IU/L (ref 0–44)
AST: 31 IU/L (ref 0–40)
Albumin: 4.7 g/dL (ref 3.5–4.7)
Alkaline Phosphatase: 107 IU/L (ref 39–117)
BUN/Creatinine Ratio: 14 (ref 10–24)
BUN: 21 mg/dL (ref 8–27)
Bilirubin Total: 0.4 mg/dL (ref 0.0–1.2)
CALCIUM: 10 mg/dL (ref 8.6–10.2)
CO2: 22 mmol/L (ref 20–29)
Chloride: 100 mmol/L (ref 96–106)
Creatinine, Ser: 1.49 mg/dL — ABNORMAL HIGH (ref 0.76–1.27)
GFR calc Af Amer: 49 mL/min/{1.73_m2} — ABNORMAL LOW (ref >59)
GFR, EST NON AFRICAN AMERICAN: 42 mL/min/{1.73_m2} — AB (ref >59)
GLOBULIN, TOTAL: 2.9 g/dL (ref 1.5–4.5)
Glucose: 103 mg/dL — ABNORMAL HIGH (ref 65–99)
POTASSIUM: 4.7 mmol/L (ref 3.5–5.2)
Sodium: 143 mmol/L (ref 134–144)
Total Protein: 7.6 g/dL (ref 6.0–8.5)

## 2017-11-06 LAB — LIPID PANEL
CHOL/HDL RATIO: 2.6 ratio (ref 0.0–5.0)
Cholesterol, Total: 184 mg/dL (ref 100–199)
HDL: 71 mg/dL (ref >39)
LDL Calculated: 100 mg/dL — ABNORMAL HIGH (ref 0–99)
TRIGLYCERIDES: 67 mg/dL (ref 0–149)
VLDL Cholesterol Cal: 13 mg/dL (ref 5–40)

## 2017-11-06 NOTE — Telephone Encounter (Signed)
-----   Message from Richardo Priest, MD sent at 11/06/2017  6:51 AM EDT ----- good result no changes in treatment

## 2017-11-06 NOTE — Telephone Encounter (Signed)
Claiborne Billings at [facility] facility notified of results.

## 2017-11-06 NOTE — Telephone Encounter (Signed)
Attempted to reach facility [facility]) but there was no answer.  Will continue efforts.

## 2017-12-03 DIAGNOSIS — Z8673 Personal history of transient ischemic attack (TIA), and cerebral infarction without residual deficits: Secondary | ICD-10-CM | POA: Diagnosis not present

## 2017-12-03 DIAGNOSIS — I4891 Unspecified atrial fibrillation: Secondary | ICD-10-CM | POA: Diagnosis not present

## 2017-12-03 DIAGNOSIS — R54 Age-related physical debility: Secondary | ICD-10-CM | POA: Diagnosis not present

## 2017-12-03 DIAGNOSIS — S40811A Abrasion of right upper arm, initial encounter: Secondary | ICD-10-CM | POA: Diagnosis not present

## 2017-12-31 DIAGNOSIS — M545 Low back pain: Secondary | ICD-10-CM | POA: Diagnosis not present

## 2017-12-31 DIAGNOSIS — G894 Chronic pain syndrome: Secondary | ICD-10-CM | POA: Diagnosis not present

## 2017-12-31 DIAGNOSIS — R54 Age-related physical debility: Secondary | ICD-10-CM | POA: Diagnosis not present

## 2017-12-31 DIAGNOSIS — K5900 Constipation, unspecified: Secondary | ICD-10-CM | POA: Diagnosis not present

## 2018-01-20 DIAGNOSIS — R54 Age-related physical debility: Secondary | ICD-10-CM | POA: Diagnosis not present

## 2018-01-20 DIAGNOSIS — N401 Enlarged prostate with lower urinary tract symptoms: Secondary | ICD-10-CM | POA: Diagnosis not present

## 2018-01-20 DIAGNOSIS — R109 Unspecified abdominal pain: Secondary | ICD-10-CM | POA: Diagnosis not present

## 2018-01-20 DIAGNOSIS — K5900 Constipation, unspecified: Secondary | ICD-10-CM | POA: Diagnosis not present

## 2018-02-12 DIAGNOSIS — N183 Chronic kidney disease, stage 3 (moderate): Secondary | ICD-10-CM | POA: Diagnosis not present

## 2018-02-12 DIAGNOSIS — I129 Hypertensive chronic kidney disease with stage 1 through stage 4 chronic kidney disease, or unspecified chronic kidney disease: Secondary | ICD-10-CM | POA: Diagnosis not present

## 2018-02-12 DIAGNOSIS — I480 Paroxysmal atrial fibrillation: Secondary | ICD-10-CM | POA: Diagnosis not present

## 2018-02-12 DIAGNOSIS — I5032 Chronic diastolic (congestive) heart failure: Secondary | ICD-10-CM | POA: Diagnosis not present

## 2018-02-15 DIAGNOSIS — D649 Anemia, unspecified: Secondary | ICD-10-CM | POA: Diagnosis not present

## 2018-02-15 DIAGNOSIS — E119 Type 2 diabetes mellitus without complications: Secondary | ICD-10-CM | POA: Diagnosis not present

## 2018-02-15 DIAGNOSIS — E785 Hyperlipidemia, unspecified: Secondary | ICD-10-CM | POA: Diagnosis not present

## 2018-02-15 DIAGNOSIS — D510 Vitamin B12 deficiency anemia due to intrinsic factor deficiency: Secondary | ICD-10-CM | POA: Diagnosis not present

## 2018-02-15 DIAGNOSIS — E039 Hypothyroidism, unspecified: Secondary | ICD-10-CM | POA: Diagnosis not present

## 2018-02-17 DIAGNOSIS — N200 Calculus of kidney: Secondary | ICD-10-CM | POA: Diagnosis not present

## 2018-02-18 DIAGNOSIS — R1031 Right lower quadrant pain: Secondary | ICD-10-CM | POA: Diagnosis not present

## 2018-02-18 DIAGNOSIS — R54 Age-related physical debility: Secondary | ICD-10-CM | POA: Diagnosis not present

## 2018-02-18 DIAGNOSIS — R10813 Right lower quadrant abdominal tenderness: Secondary | ICD-10-CM | POA: Diagnosis not present

## 2018-02-18 DIAGNOSIS — N200 Calculus of kidney: Secondary | ICD-10-CM | POA: Diagnosis not present

## 2018-02-24 DIAGNOSIS — N400 Enlarged prostate without lower urinary tract symptoms: Secondary | ICD-10-CM | POA: Diagnosis not present

## 2018-02-24 DIAGNOSIS — R100 Acute abdomen: Secondary | ICD-10-CM | POA: Diagnosis not present

## 2018-02-24 DIAGNOSIS — N3000 Acute cystitis without hematuria: Secondary | ICD-10-CM | POA: Diagnosis not present

## 2018-02-24 DIAGNOSIS — N318 Other neuromuscular dysfunction of bladder: Secondary | ICD-10-CM | POA: Diagnosis not present

## 2018-02-24 DIAGNOSIS — N401 Enlarged prostate with lower urinary tract symptoms: Secondary | ICD-10-CM | POA: Diagnosis not present

## 2018-02-26 DIAGNOSIS — R100 Acute abdomen: Secondary | ICD-10-CM | POA: Diagnosis not present

## 2018-02-26 DIAGNOSIS — R34 Anuria and oliguria: Secondary | ICD-10-CM | POA: Diagnosis not present

## 2018-02-26 DIAGNOSIS — R1084 Generalized abdominal pain: Secondary | ICD-10-CM | POA: Diagnosis not present

## 2018-02-26 DIAGNOSIS — N318 Other neuromuscular dysfunction of bladder: Secondary | ICD-10-CM | POA: Diagnosis not present

## 2018-02-26 DIAGNOSIS — N200 Calculus of kidney: Secondary | ICD-10-CM | POA: Diagnosis not present

## 2018-02-26 DIAGNOSIS — R52 Pain, unspecified: Secondary | ICD-10-CM | POA: Diagnosis not present

## 2018-02-26 DIAGNOSIS — R5381 Other malaise: Secondary | ICD-10-CM | POA: Diagnosis not present

## 2018-03-02 DIAGNOSIS — I5020 Unspecified systolic (congestive) heart failure: Secondary | ICD-10-CM | POA: Diagnosis not present

## 2018-03-02 DIAGNOSIS — N401 Enlarged prostate with lower urinary tract symptoms: Secondary | ICD-10-CM | POA: Diagnosis not present

## 2018-03-02 DIAGNOSIS — I4891 Unspecified atrial fibrillation: Secondary | ICD-10-CM | POA: Diagnosis not present

## 2018-03-02 DIAGNOSIS — I252 Old myocardial infarction: Secondary | ICD-10-CM | POA: Diagnosis not present

## 2018-03-05 DIAGNOSIS — N312 Flaccid neuropathic bladder, not elsewhere classified: Secondary | ICD-10-CM | POA: Diagnosis not present

## 2018-03-05 DIAGNOSIS — N3020 Other chronic cystitis without hematuria: Secondary | ICD-10-CM | POA: Diagnosis not present

## 2018-03-05 DIAGNOSIS — N401 Enlarged prostate with lower urinary tract symptoms: Secondary | ICD-10-CM | POA: Diagnosis not present

## 2018-04-05 DIAGNOSIS — R112 Nausea with vomiting, unspecified: Secondary | ICD-10-CM | POA: Diagnosis not present

## 2018-04-05 DIAGNOSIS — I509 Heart failure, unspecified: Secondary | ICD-10-CM | POA: Diagnosis not present

## 2018-04-05 DIAGNOSIS — I4891 Unspecified atrial fibrillation: Secondary | ICD-10-CM | POA: Diagnosis not present

## 2018-04-05 DIAGNOSIS — R1084 Generalized abdominal pain: Secondary | ICD-10-CM | POA: Diagnosis not present

## 2018-04-05 DIAGNOSIS — K219 Gastro-esophageal reflux disease without esophagitis: Secondary | ICD-10-CM | POA: Diagnosis not present

## 2018-04-05 DIAGNOSIS — I6503 Occlusion and stenosis of bilateral vertebral arteries: Secondary | ICD-10-CM | POA: Diagnosis not present

## 2018-04-05 DIAGNOSIS — D649 Anemia, unspecified: Secondary | ICD-10-CM | POA: Diagnosis not present

## 2018-04-05 DIAGNOSIS — I371 Nonrheumatic pulmonary valve insufficiency: Secondary | ICD-10-CM | POA: Diagnosis not present

## 2018-04-05 DIAGNOSIS — R404 Transient alteration of awareness: Secondary | ICD-10-CM | POA: Diagnosis not present

## 2018-04-05 DIAGNOSIS — Z888 Allergy status to other drugs, medicaments and biological substances status: Secondary | ICD-10-CM | POA: Diagnosis not present

## 2018-04-05 DIAGNOSIS — I6523 Occlusion and stenosis of bilateral carotid arteries: Secondary | ICD-10-CM | POA: Diagnosis not present

## 2018-04-05 DIAGNOSIS — N184 Chronic kidney disease, stage 4 (severe): Secondary | ICD-10-CM | POA: Diagnosis not present

## 2018-04-05 DIAGNOSIS — R1310 Dysphagia, unspecified: Secondary | ICD-10-CM | POA: Diagnosis not present

## 2018-04-05 DIAGNOSIS — I361 Nonrheumatic tricuspid (valve) insufficiency: Secondary | ICD-10-CM | POA: Diagnosis not present

## 2018-04-05 DIAGNOSIS — I340 Nonrheumatic mitral (valve) insufficiency: Secondary | ICD-10-CM | POA: Diagnosis not present

## 2018-04-05 DIAGNOSIS — Z951 Presence of aortocoronary bypass graft: Secondary | ICD-10-CM | POA: Diagnosis not present

## 2018-04-05 DIAGNOSIS — Z8673 Personal history of transient ischemic attack (TIA), and cerebral infarction without residual deficits: Secondary | ICD-10-CM | POA: Diagnosis not present

## 2018-04-05 DIAGNOSIS — I2510 Atherosclerotic heart disease of native coronary artery without angina pectoris: Secondary | ICD-10-CM | POA: Diagnosis not present

## 2018-04-05 DIAGNOSIS — R410 Disorientation, unspecified: Secondary | ICD-10-CM | POA: Diagnosis not present

## 2018-04-05 DIAGNOSIS — Z79899 Other long term (current) drug therapy: Secondary | ICD-10-CM | POA: Diagnosis not present

## 2018-04-05 DIAGNOSIS — R197 Diarrhea, unspecified: Secondary | ICD-10-CM | POA: Diagnosis not present

## 2018-04-05 DIAGNOSIS — Z7982 Long term (current) use of aspirin: Secondary | ICD-10-CM | POA: Diagnosis not present

## 2018-04-05 DIAGNOSIS — E785 Hyperlipidemia, unspecified: Secondary | ICD-10-CM | POA: Diagnosis not present

## 2018-04-05 DIAGNOSIS — G459 Transient cerebral ischemic attack, unspecified: Secondary | ICD-10-CM | POA: Diagnosis not present

## 2018-04-05 DIAGNOSIS — I252 Old myocardial infarction: Secondary | ICD-10-CM | POA: Diagnosis not present

## 2018-04-05 DIAGNOSIS — I130 Hypertensive heart and chronic kidney disease with heart failure and stage 1 through stage 4 chronic kidney disease, or unspecified chronic kidney disease: Secondary | ICD-10-CM | POA: Diagnosis not present

## 2018-04-05 DIAGNOSIS — R4781 Slurred speech: Secondary | ICD-10-CM | POA: Diagnosis not present

## 2018-04-05 DIAGNOSIS — Z87891 Personal history of nicotine dependence: Secondary | ICD-10-CM | POA: Diagnosis not present

## 2018-04-05 DIAGNOSIS — Z7902 Long term (current) use of antithrombotics/antiplatelets: Secondary | ICD-10-CM | POA: Diagnosis not present

## 2018-04-05 DIAGNOSIS — R531 Weakness: Secondary | ICD-10-CM | POA: Diagnosis not present

## 2018-04-05 DIAGNOSIS — R471 Dysarthria and anarthria: Secondary | ICD-10-CM | POA: Diagnosis not present

## 2018-04-05 DIAGNOSIS — R4701 Aphasia: Secondary | ICD-10-CM | POA: Diagnosis not present

## 2018-04-07 DIAGNOSIS — G459 Transient cerebral ischemic attack, unspecified: Secondary | ICD-10-CM | POA: Diagnosis not present

## 2018-04-07 DIAGNOSIS — I482 Chronic atrial fibrillation, unspecified: Secondary | ICD-10-CM | POA: Diagnosis not present

## 2018-04-07 DIAGNOSIS — R1310 Dysphagia, unspecified: Secondary | ICD-10-CM | POA: Diagnosis not present

## 2018-04-07 DIAGNOSIS — R4701 Aphasia: Secondary | ICD-10-CM | POA: Diagnosis not present

## 2018-04-13 DIAGNOSIS — S51011A Laceration without foreign body of right elbow, initial encounter: Secondary | ICD-10-CM | POA: Diagnosis not present

## 2018-04-13 DIAGNOSIS — R54 Age-related physical debility: Secondary | ICD-10-CM | POA: Diagnosis not present

## 2018-04-13 DIAGNOSIS — M6281 Muscle weakness (generalized): Secondary | ICD-10-CM | POA: Diagnosis not present

## 2018-04-13 DIAGNOSIS — I4891 Unspecified atrial fibrillation: Secondary | ICD-10-CM | POA: Diagnosis not present

## 2018-04-16 DIAGNOSIS — N3020 Other chronic cystitis without hematuria: Secondary | ICD-10-CM | POA: Diagnosis not present

## 2018-04-16 DIAGNOSIS — N401 Enlarged prostate with lower urinary tract symptoms: Secondary | ICD-10-CM | POA: Diagnosis not present

## 2018-04-16 DIAGNOSIS — N318 Other neuromuscular dysfunction of bladder: Secondary | ICD-10-CM | POA: Diagnosis not present

## 2018-05-05 DIAGNOSIS — M50823 Other cervical disc disorders at C6-C7 level: Secondary | ICD-10-CM | POA: Diagnosis not present

## 2018-05-05 DIAGNOSIS — M50822 Other cervical disc disorders at C5-C6 level: Secondary | ICD-10-CM | POA: Diagnosis not present

## 2018-05-05 DIAGNOSIS — T68XXXA Hypothermia, initial encounter: Secondary | ICD-10-CM | POA: Diagnosis not present

## 2018-05-05 DIAGNOSIS — R531 Weakness: Secondary | ICD-10-CM | POA: Diagnosis not present

## 2018-05-05 DIAGNOSIS — R410 Disorientation, unspecified: Secondary | ICD-10-CM | POA: Diagnosis not present

## 2018-05-05 DIAGNOSIS — R404 Transient alteration of awareness: Secondary | ICD-10-CM | POA: Diagnosis not present

## 2018-05-05 DIAGNOSIS — E161 Other hypoglycemia: Secondary | ICD-10-CM | POA: Diagnosis not present

## 2018-05-05 DIAGNOSIS — E162 Hypoglycemia, unspecified: Secondary | ICD-10-CM | POA: Diagnosis not present

## 2018-05-05 DIAGNOSIS — M2550 Pain in unspecified joint: Secondary | ICD-10-CM | POA: Diagnosis not present

## 2018-05-05 DIAGNOSIS — I10 Essential (primary) hypertension: Secondary | ICD-10-CM | POA: Diagnosis not present

## 2018-05-05 DIAGNOSIS — Z7401 Bed confinement status: Secondary | ICD-10-CM | POA: Diagnosis not present

## 2018-05-05 DIAGNOSIS — S0990XA Unspecified injury of head, initial encounter: Secondary | ICD-10-CM | POA: Diagnosis not present

## 2018-05-05 DIAGNOSIS — R0602 Shortness of breath: Secondary | ICD-10-CM | POA: Diagnosis not present

## 2018-05-05 DIAGNOSIS — M50821 Other cervical disc disorders at C4-C5 level: Secondary | ICD-10-CM | POA: Diagnosis not present

## 2018-05-05 DIAGNOSIS — R4020 Unspecified coma: Secondary | ICD-10-CM | POA: Diagnosis not present

## 2018-05-06 DIAGNOSIS — E1122 Type 2 diabetes mellitus with diabetic chronic kidney disease: Secondary | ICD-10-CM | POA: Diagnosis not present

## 2018-05-06 DIAGNOSIS — I129 Hypertensive chronic kidney disease with stage 1 through stage 4 chronic kidney disease, or unspecified chronic kidney disease: Secondary | ICD-10-CM | POA: Diagnosis not present

## 2018-05-06 DIAGNOSIS — N183 Chronic kidney disease, stage 3 (moderate): Secondary | ICD-10-CM | POA: Diagnosis not present

## 2018-05-06 DIAGNOSIS — I4891 Unspecified atrial fibrillation: Secondary | ICD-10-CM | POA: Diagnosis not present

## 2018-05-07 DIAGNOSIS — E119 Type 2 diabetes mellitus without complications: Secondary | ICD-10-CM | POA: Diagnosis not present

## 2018-05-19 DIAGNOSIS — F410 Panic disorder [episodic paroxysmal anxiety] without agoraphobia: Secondary | ICD-10-CM | POA: Diagnosis not present

## 2018-05-19 DIAGNOSIS — F419 Anxiety disorder, unspecified: Secondary | ICD-10-CM | POA: Diagnosis not present

## 2018-06-14 DIAGNOSIS — J449 Chronic obstructive pulmonary disease, unspecified: Secondary | ICD-10-CM | POA: Diagnosis not present

## 2018-06-14 DIAGNOSIS — I5020 Unspecified systolic (congestive) heart failure: Secondary | ICD-10-CM | POA: Diagnosis not present

## 2018-06-14 DIAGNOSIS — R635 Abnormal weight gain: Secondary | ICD-10-CM | POA: Diagnosis not present

## 2018-06-14 DIAGNOSIS — R0602 Shortness of breath: Secondary | ICD-10-CM | POA: Diagnosis not present

## 2018-06-14 DIAGNOSIS — I5032 Chronic diastolic (congestive) heart failure: Secondary | ICD-10-CM | POA: Diagnosis not present

## 2018-06-15 DIAGNOSIS — J449 Chronic obstructive pulmonary disease, unspecified: Secondary | ICD-10-CM | POA: Diagnosis not present

## 2018-06-15 DIAGNOSIS — I5030 Unspecified diastolic (congestive) heart failure: Secondary | ICD-10-CM | POA: Diagnosis not present

## 2018-06-15 DIAGNOSIS — I509 Heart failure, unspecified: Secondary | ICD-10-CM | POA: Diagnosis not present

## 2018-06-15 DIAGNOSIS — I5020 Unspecified systolic (congestive) heart failure: Secondary | ICD-10-CM | POA: Diagnosis not present

## 2018-06-30 DIAGNOSIS — M1990 Unspecified osteoarthritis, unspecified site: Secondary | ICD-10-CM | POA: Diagnosis not present

## 2018-06-30 DIAGNOSIS — I2510 Atherosclerotic heart disease of native coronary artery without angina pectoris: Secondary | ICD-10-CM | POA: Diagnosis not present

## 2018-06-30 DIAGNOSIS — I10 Essential (primary) hypertension: Secondary | ICD-10-CM | POA: Diagnosis not present

## 2018-06-30 DIAGNOSIS — I4811 Longstanding persistent atrial fibrillation: Secondary | ICD-10-CM | POA: Diagnosis not present

## 2018-07-01 DIAGNOSIS — E559 Vitamin D deficiency, unspecified: Secondary | ICD-10-CM | POA: Diagnosis not present

## 2018-07-01 DIAGNOSIS — D510 Vitamin B12 deficiency anemia due to intrinsic factor deficiency: Secondary | ICD-10-CM | POA: Diagnosis not present

## 2018-07-01 DIAGNOSIS — I110 Hypertensive heart disease with heart failure: Secondary | ICD-10-CM | POA: Diagnosis not present

## 2018-07-01 DIAGNOSIS — N184 Chronic kidney disease, stage 4 (severe): Secondary | ICD-10-CM | POA: Diagnosis not present

## 2018-07-02 DIAGNOSIS — J309 Allergic rhinitis, unspecified: Secondary | ICD-10-CM | POA: Diagnosis not present

## 2018-07-02 DIAGNOSIS — R799 Abnormal finding of blood chemistry, unspecified: Secondary | ICD-10-CM | POA: Diagnosis not present

## 2018-07-02 DIAGNOSIS — E538 Deficiency of other specified B group vitamins: Secondary | ICD-10-CM | POA: Diagnosis not present

## 2018-07-02 DIAGNOSIS — R54 Age-related physical debility: Secondary | ICD-10-CM | POA: Diagnosis not present

## 2018-07-03 DIAGNOSIS — I2510 Atherosclerotic heart disease of native coronary artery without angina pectoris: Secondary | ICD-10-CM | POA: Diagnosis not present

## 2018-07-03 DIAGNOSIS — I6522 Occlusion and stenosis of left carotid artery: Secondary | ICD-10-CM | POA: Diagnosis not present

## 2018-07-03 DIAGNOSIS — J90 Pleural effusion, not elsewhere classified: Secondary | ICD-10-CM | POA: Diagnosis not present

## 2018-07-03 DIAGNOSIS — R55 Syncope and collapse: Secondary | ICD-10-CM | POA: Diagnosis not present

## 2018-07-03 DIAGNOSIS — I4891 Unspecified atrial fibrillation: Secondary | ICD-10-CM | POA: Diagnosis not present

## 2018-07-03 DIAGNOSIS — Z8673 Personal history of transient ischemic attack (TIA), and cerebral infarction without residual deficits: Secondary | ICD-10-CM | POA: Diagnosis not present

## 2018-07-03 DIAGNOSIS — I491 Atrial premature depolarization: Secondary | ICD-10-CM | POA: Diagnosis not present

## 2018-07-03 DIAGNOSIS — R1110 Vomiting, unspecified: Secondary | ICD-10-CM | POA: Diagnosis not present

## 2018-07-03 DIAGNOSIS — R112 Nausea with vomiting, unspecified: Secondary | ICD-10-CM | POA: Diagnosis not present

## 2018-07-03 DIAGNOSIS — I10 Essential (primary) hypertension: Secondary | ICD-10-CM | POA: Diagnosis not present

## 2018-07-03 DIAGNOSIS — I4821 Permanent atrial fibrillation: Secondary | ICD-10-CM | POA: Diagnosis not present

## 2018-07-06 DIAGNOSIS — R112 Nausea with vomiting, unspecified: Secondary | ICD-10-CM | POA: Diagnosis not present

## 2018-07-06 DIAGNOSIS — I4891 Unspecified atrial fibrillation: Secondary | ICD-10-CM | POA: Diagnosis not present

## 2018-07-06 DIAGNOSIS — J918 Pleural effusion in other conditions classified elsewhere: Secondary | ICD-10-CM | POA: Diagnosis not present

## 2018-07-06 DIAGNOSIS — J90 Pleural effusion, not elsewhere classified: Secondary | ICD-10-CM | POA: Diagnosis not present

## 2018-07-06 DIAGNOSIS — I6522 Occlusion and stenosis of left carotid artery: Secondary | ICD-10-CM | POA: Diagnosis not present

## 2018-07-06 DIAGNOSIS — R0989 Other specified symptoms and signs involving the circulatory and respiratory systems: Secondary | ICD-10-CM | POA: Diagnosis not present

## 2018-07-12 DIAGNOSIS — S5002XA Contusion of left elbow, initial encounter: Secondary | ICD-10-CM | POA: Diagnosis not present

## 2018-07-12 DIAGNOSIS — S40022A Contusion of left upper arm, initial encounter: Secondary | ICD-10-CM | POA: Diagnosis not present

## 2018-07-12 DIAGNOSIS — R600 Localized edema: Secondary | ICD-10-CM | POA: Diagnosis not present

## 2018-07-12 DIAGNOSIS — R54 Age-related physical debility: Secondary | ICD-10-CM | POA: Diagnosis not present

## 2018-07-16 DIAGNOSIS — I5020 Unspecified systolic (congestive) heart failure: Secondary | ICD-10-CM | POA: Diagnosis not present

## 2018-07-16 DIAGNOSIS — I5030 Unspecified diastolic (congestive) heart failure: Secondary | ICD-10-CM | POA: Diagnosis not present

## 2018-07-16 DIAGNOSIS — J449 Chronic obstructive pulmonary disease, unspecified: Secondary | ICD-10-CM | POA: Diagnosis not present

## 2018-08-15 DIAGNOSIS — I5030 Unspecified diastolic (congestive) heart failure: Secondary | ICD-10-CM | POA: Diagnosis not present

## 2018-08-15 DIAGNOSIS — J449 Chronic obstructive pulmonary disease, unspecified: Secondary | ICD-10-CM | POA: Diagnosis not present

## 2018-08-15 DIAGNOSIS — I5020 Unspecified systolic (congestive) heart failure: Secondary | ICD-10-CM | POA: Diagnosis not present

## 2018-09-10 ENCOUNTER — Other Ambulatory Visit: Payer: Self-pay

## 2018-09-15 DIAGNOSIS — J449 Chronic obstructive pulmonary disease, unspecified: Secondary | ICD-10-CM | POA: Diagnosis not present

## 2018-09-15 DIAGNOSIS — I5020 Unspecified systolic (congestive) heart failure: Secondary | ICD-10-CM | POA: Diagnosis not present

## 2018-09-15 DIAGNOSIS — I5030 Unspecified diastolic (congestive) heart failure: Secondary | ICD-10-CM | POA: Diagnosis not present

## 2018-09-22 DIAGNOSIS — I129 Hypertensive chronic kidney disease with stage 1 through stage 4 chronic kidney disease, or unspecified chronic kidney disease: Secondary | ICD-10-CM | POA: Diagnosis not present

## 2018-09-22 DIAGNOSIS — J449 Chronic obstructive pulmonary disease, unspecified: Secondary | ICD-10-CM | POA: Diagnosis not present

## 2018-09-22 DIAGNOSIS — I2510 Atherosclerotic heart disease of native coronary artery without angina pectoris: Secondary | ICD-10-CM | POA: Diagnosis not present

## 2018-09-22 DIAGNOSIS — I252 Old myocardial infarction: Secondary | ICD-10-CM | POA: Diagnosis not present

## 2018-09-30 DIAGNOSIS — D649 Anemia, unspecified: Secondary | ICD-10-CM | POA: Diagnosis not present

## 2018-09-30 DIAGNOSIS — I10 Essential (primary) hypertension: Secondary | ICD-10-CM | POA: Diagnosis not present

## 2018-10-16 DIAGNOSIS — I5030 Unspecified diastolic (congestive) heart failure: Secondary | ICD-10-CM | POA: Diagnosis not present

## 2018-10-16 DIAGNOSIS — J449 Chronic obstructive pulmonary disease, unspecified: Secondary | ICD-10-CM | POA: Diagnosis not present

## 2018-10-16 DIAGNOSIS — I5020 Unspecified systolic (congestive) heart failure: Secondary | ICD-10-CM | POA: Diagnosis not present

## 2018-10-22 DIAGNOSIS — K30 Functional dyspepsia: Secondary | ICD-10-CM | POA: Diagnosis not present

## 2018-10-22 DIAGNOSIS — R54 Age-related physical debility: Secondary | ICD-10-CM | POA: Diagnosis not present

## 2018-10-22 DIAGNOSIS — K4090 Unilateral inguinal hernia, without obstruction or gangrene, not specified as recurrent: Secondary | ICD-10-CM | POA: Diagnosis not present

## 2018-10-28 DIAGNOSIS — Z23 Encounter for immunization: Secondary | ICD-10-CM | POA: Diagnosis not present

## 2018-11-03 DIAGNOSIS — K5900 Constipation, unspecified: Secondary | ICD-10-CM | POA: Diagnosis not present

## 2018-11-15 DIAGNOSIS — J449 Chronic obstructive pulmonary disease, unspecified: Secondary | ICD-10-CM | POA: Diagnosis not present

## 2018-11-15 DIAGNOSIS — I5020 Unspecified systolic (congestive) heart failure: Secondary | ICD-10-CM | POA: Diagnosis not present

## 2018-11-15 DIAGNOSIS — I5030 Unspecified diastolic (congestive) heart failure: Secondary | ICD-10-CM | POA: Diagnosis not present

## 2018-11-16 ENCOUNTER — Other Ambulatory Visit: Payer: Self-pay

## 2018-11-16 DIAGNOSIS — I6522 Occlusion and stenosis of left carotid artery: Secondary | ICD-10-CM

## 2018-11-22 ENCOUNTER — Ambulatory Visit (INDEPENDENT_AMBULATORY_CARE_PROVIDER_SITE_OTHER): Payer: PPO | Admitting: Family

## 2018-11-22 ENCOUNTER — Encounter: Payer: Self-pay | Admitting: Family

## 2018-11-22 ENCOUNTER — Other Ambulatory Visit: Payer: Self-pay

## 2018-11-22 ENCOUNTER — Ambulatory Visit (HOSPITAL_COMMUNITY)
Admission: RE | Admit: 2018-11-22 | Discharge: 2018-11-22 | Disposition: A | Discharge status: Home / Self Care | Payer: PPO | Source: Ambulatory Visit | Attending: Family | Admitting: Family

## 2018-11-22 ENCOUNTER — Telehealth: Payer: Self-pay | Admitting: *Deleted

## 2018-11-22 VITALS — BP 134/71 | HR 47 | Temp 97.1°F | Resp 16 | Wt 150.0 lb

## 2018-11-22 DIAGNOSIS — I6522 Occlusion and stenosis of left carotid artery: Secondary | ICD-10-CM | POA: Insufficient documentation

## 2018-11-22 DIAGNOSIS — Z87891 Personal history of nicotine dependence: Secondary | ICD-10-CM | POA: Diagnosis not present

## 2018-11-22 DIAGNOSIS — Z9889 Other specified postprocedural states: Secondary | ICD-10-CM | POA: Diagnosis not present

## 2018-11-22 NOTE — Patient Instructions (Signed)

## 2018-11-22 NOTE — Progress Notes (Signed)
Virtual Visit via Telephone Note  I connected with Dave Harris,, daughter of Kay Khan, on 11/22/2018 using the [email or web address] by telephone and verified that I was speaking with the correct person using two identifiers. Patient's daughter was located at her home and accompanied by herself. I am located at the [facility] office/clinic.   The limitations of evaluation and management by telemedicine and the availability of in person appointments have been previously discussed with the patient and are documented in the patients chart. The patient expressed understanding and consented to proceed.  PCP: Garwin Brothers, MD  Chief Complaint: Follow up extracranial carotid artery stenosis   History of Present Illness: Dave Harris is a 85 y.o. male who was having repeated episodes of syncope. He was found to have a tight left carotid stenosis. Left carotid endarterectomy was recommended in order to lower his risk of future stroke. Of note he had a fairly large artery and for this reason the endarterectomy site was closed primarily. His surgery was on 05/24/2015, performed by Dr. Scot Dock. Of note, his preoperative cerebral arteriogram showed no significant stenosis on the right.  Daughter statedhe had several TIA's preoperatively, nonesince the left CEA. The TIA's manifested as speech difficulties and transient right hemiparesis, daughter deniedthat pt had any vision changes. Daughter statedpt has c/o his legsbeingweak, pt is very hard of hearing.  Pt lives in [facility].  At his last visit pt denied any focal weakness or paresthesias. He has no problems swallowing.  He has issues with falling only when he stands up too fast,  he has had several episodes of feeling dizzy for a long time. At his last visit he stated he falls and feels like he is going to fall only when he is standing. He had a ED visit in May 2020 with c/o syncope and vomiting.   He is on aspirin  and is on a statin.  He sees Dr. Erlinda Hong, neurologist.   Diabetic: no Tobacco use: former smoker, quit in 1978 when he had his CABG, smoked x 30 years  Pt meds include: Statin : yes ASA: yes Other anticoagulants/antiplatelets: Eliquis for atrial fib    Past Medical History:  Diagnosis Date  . A-fib ([place]) 06/10/2012  . Allergy   . Bilateral carotid artery stenosis 09/03/2016  . Bruit 06/10/2012  . CAD (coronary artery disease)   . Carotid stenosis   . Cerebrovascular accident (CVA) due to embolism of left middle cerebral artery ([place]) 08/22/2015  . CHF (congestive heart failure) ([place])   . Chronic anticoagulation 08/22/2015  . Chronic atrial fibrillation ([place])   . Complication of anesthesia    "sometimes I'm hard to wake up (05/23/2015)  . 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 CVA (cerebral infarction) 05/22/2015  . Depression   . Essential hypertension 07/21/2014  . Grief reaction 12/11/2015  . Heart disease   . Heart disease   . History of blood transfusion    "w/my heart OR"  . History of stomach ulcers "sever"  . HLD (hyperlipidemia)   . Hyperlipidemia   . Hypertension   . Myocardial infarction ([place])   . Pure hypercholesterolemia 07/21/2014  . S/P carotid endarterectomy 06/19/2015  . Stroke [facility]) several   "6 since June 2016; last one was 05/19/2015"; denies residual  (05/23/2015)    Past Surgical History:  Procedure Laterality Date  . BACK SURGERY    . CARDIAC CATHETERIZATION    .  CARPAL TUNNEL RELEASE Bilateral   . CATARACT EXTRACTION W/ INTRAOCULAR LENS  IMPLANT, BILATERAL Bilateral   . COLON SURGERY    . CORONARY ARTERY BYPASS GRAFT  1978   CABG X"?3"  . ENDARTERECTOMY Left 05/24/2015   Procedure: Left ENDARTERECTOMY CAROTID;  Surgeon: Angelia Mould, MD;  Location: [place];  Service: Vascular;  Laterality: Left;  . LUMBAR DISC SURGERY     "had 4 ruptured discs"  . MULTIPLE TOOTH  EXTRACTIONS    . STOMACH SURGERY     "had 1/2 my stomach removed; had several stomach ulcers"  . TONSILLECTOMY AND ADENOIDECTOMY  ~ 1947  . TRANSURETHRAL RESECTION OF PROSTATE  X 3   "never removed it" (05/22/2015)    Current Meds  Medication Sig  . acetaminophen (TYLENOL) 325 MG tablet Take 650 mg by mouth every 6 (six) hours as needed.  Marland Kitchen apixaban (ELIQUIS) 5 MG TABS tablet Take 5 mg by mouth 2 (two) times daily.  Marland Kitchen aspirin 81 MG tablet Take 81 mg by mouth daily.  Marland Kitchen atorvastatin (LIPITOR) 40 MG tablet Take 40 mg by mouth daily.   . bisacodyl (DULCOLAX) 10 MG suppository Place 10 mg rectally as needed for moderate constipation.  Marland Kitchen BREO ELLIPTA 100-25 MCG/INH AEPB Take 1 puff by mouth daily.  . carvedilol (COREG) 6.25 MG tablet Take 6.25 mg by mouth 2 (two) times daily with a meal.  . Cholecalciferol (VITAMIN D3) 2000 units TABS Take 1 tablet by mouth daily.   Marland Kitchen Dextromethorphan-Guaifenesin (ROBITUSSIN DM) 10-100 MG/5ML liquid Take 5 mLs by mouth as needed.   . docusate sodium (COLACE) 100 MG capsule Take 100 mg by mouth 2 (two) times daily.   . fexofenadine (ALLEGRA) 180 MG tablet Take 180 mg by mouth daily.  . finasteride (PROSCAR) 5 MG tablet Take 5 mg by mouth daily.  . furosemide (LASIX) 20 MG tablet Take 20 mg by mouth daily.   Marland Kitchen guaiFENesin (MUCINEX) 600 MG 12 hr tablet Take by mouth 2 (two) times daily.  Marland Kitchen HYDROcodone-acetaminophen (NORCO/VICODIN) 5-325 MG tablet Take 1 tablet by mouth daily.   Marland Kitchen ipratropium-albuterol (DUONEB) 0.5-2.5 (3) MG/3ML SOLN Inhale 3 mLs into the lungs QID.  Marland Kitchen LINZESS 145 MCG CAPS capsule Take 145 mcg by mouth once a week.   . loperamide (IMODIUM A-D) 2 MG capsule Take by mouth as needed for diarrhea or loose stools.  . mirtazapine (REMERON) 30 MG tablet Take 30 mg by mouth at bedtime.   . Multiple Vitamins-Minerals (PRESERVISION AREDS PO) Take 1 tablet by mouth every morning.  . nitroGLYCERIN (NITROSTAT) 0.4 MG SL tablet Place 0.4 mg under the tongue  every 5 (five) minutes as needed for chest pain.   . pantoprazole (PROTONIX) 40 MG tablet Take 1 tablet (40 mg total) by mouth daily.  Marland Kitchen senna-docusate (SENNA-PLUS) 8.6-50 MG tablet Take 1 tablet by mouth 2 (two) times daily.   . tamsulosin (FLOMAX) 0.4 MG CAPS capsule Take 0.4 mg by mouth daily.   . vitamin B-12 (CYANOCOBALAMIN) 1000 MCG tablet Take 1,000 mcg by mouth daily.     12 system ROS was negative unless otherwise noted in HPI   Observations/Objective:  DATA  Carotid Duplex (11-22-18): Right Carotid: Velocities in the right ICA are consistent with a 1-39% stenosis. Left Carotid: Patent left carotid endarterectomy site with velocity in the left               ICA consistent with a 1-39% stenosis. Vertebrals:  Bilateral vertebral arteries demonstrate antegrade flow.  Subclavians: Normal flow hemodynamics were seen in bilateral subclavian arteries. No significant change compared to the exam on 06-01-17.   Bilateral Popliteal Artery Duplex (08/22/16): No aneurysmal dilation.   Assessment: DIAMONTE TUGMAN is a 85 y.o. male who is s/p left carotid endarterectomy on 05/24/2015. He had several TIA's preoperatively, nonesince the left CEA. The TIA's manifested as speech difficulties and transient right hemiparesis.  Today's carotid duplex shows 1-39% bilateral ICA stenosis, no change compared to 06-01-17.   He has a prominent right popliteal pulse;no popliteal artery aneurysms on duplex (08-22-16).  His atherosclerotic risk factors include smoker x 30 years until 1978, CAD, and atrial fib. He takes Eliquis, ASA, and a statin. Fortunately he does not have DM.   Follow Up Instructions:   Follow up in 1 year with carotid duplex.    I discussed the assessment and treatment plan with the patient. The patient's daughter was provided an opportunity to ask questions and all were answered. The patient's daughter agreed with the plan and demonstrated an understanding of the  instructions.   The patient's daughter was advised to call back or seek an in-person evaluation if the symptoms worsen or if the condition fails to improve as anticipated.  I spent 6 minutes with the patient's daughter via telephone encounter.   Gabrielle Dare [facility] Office: [phone]  11/22/2018, 11:52 AM

## 2018-12-03 DIAGNOSIS — R112 Nausea with vomiting, unspecified: Secondary | ICD-10-CM | POA: Diagnosis not present

## 2018-12-03 DIAGNOSIS — J029 Acute pharyngitis, unspecified: Secondary | ICD-10-CM | POA: Diagnosis not present

## 2018-12-03 DIAGNOSIS — R54 Age-related physical debility: Secondary | ICD-10-CM | POA: Diagnosis not present

## 2018-12-16 DIAGNOSIS — I5030 Unspecified diastolic (congestive) heart failure: Secondary | ICD-10-CM | POA: Diagnosis not present

## 2018-12-16 DIAGNOSIS — I5020 Unspecified systolic (congestive) heart failure: Secondary | ICD-10-CM | POA: Diagnosis not present

## 2018-12-16 DIAGNOSIS — J449 Chronic obstructive pulmonary disease, unspecified: Secondary | ICD-10-CM | POA: Diagnosis not present

## 2018-12-17 DIAGNOSIS — R5381 Other malaise: Secondary | ICD-10-CM | POA: Diagnosis not present

## 2018-12-17 DIAGNOSIS — R05 Cough: Secondary | ICD-10-CM | POA: Diagnosis not present

## 2018-12-17 DIAGNOSIS — J449 Chronic obstructive pulmonary disease, unspecified: Secondary | ICD-10-CM | POA: Diagnosis not present

## 2018-12-17 DIAGNOSIS — R0989 Other specified symptoms and signs involving the circulatory and respiratory systems: Secondary | ICD-10-CM | POA: Diagnosis not present

## 2018-12-18 DIAGNOSIS — R05 Cough: Secondary | ICD-10-CM | POA: Diagnosis not present

## 2018-12-22 DIAGNOSIS — R54 Age-related physical debility: Secondary | ICD-10-CM | POA: Diagnosis not present

## 2018-12-22 DIAGNOSIS — Z20828 Contact with and (suspected) exposure to other viral communicable diseases: Secondary | ICD-10-CM | POA: Diagnosis not present

## 2018-12-29 DIAGNOSIS — Z20828 Contact with and (suspected) exposure to other viral communicable diseases: Secondary | ICD-10-CM | POA: Diagnosis not present

## 2018-12-29 DIAGNOSIS — R54 Age-related physical debility: Secondary | ICD-10-CM | POA: Diagnosis not present

## 2019-01-05 DIAGNOSIS — R54 Age-related physical debility: Secondary | ICD-10-CM | POA: Diagnosis not present

## 2019-01-05 DIAGNOSIS — Z20828 Contact with and (suspected) exposure to other viral communicable diseases: Secondary | ICD-10-CM | POA: Diagnosis not present

## 2019-01-12 DIAGNOSIS — Z20828 Contact with and (suspected) exposure to other viral communicable diseases: Secondary | ICD-10-CM | POA: Diagnosis not present

## 2019-01-12 DIAGNOSIS — M6281 Muscle weakness (generalized): Secondary | ICD-10-CM | POA: Diagnosis not present

## 2019-01-12 DIAGNOSIS — M109 Gout, unspecified: Secondary | ICD-10-CM | POA: Diagnosis not present

## 2019-01-12 DIAGNOSIS — R54 Age-related physical debility: Secondary | ICD-10-CM | POA: Diagnosis not present

## 2019-01-15 DIAGNOSIS — J449 Chronic obstructive pulmonary disease, unspecified: Secondary | ICD-10-CM | POA: Diagnosis not present

## 2019-01-15 DIAGNOSIS — I5030 Unspecified diastolic (congestive) heart failure: Secondary | ICD-10-CM | POA: Diagnosis not present

## 2019-01-15 DIAGNOSIS — I5020 Unspecified systolic (congestive) heart failure: Secondary | ICD-10-CM | POA: Diagnosis not present

## 2019-01-19 DIAGNOSIS — R54 Age-related physical debility: Secondary | ICD-10-CM | POA: Diagnosis not present

## 2019-01-19 DIAGNOSIS — Z20828 Contact with and (suspected) exposure to other viral communicable diseases: Secondary | ICD-10-CM | POA: Diagnosis not present

## 2019-01-26 DIAGNOSIS — Z20828 Contact with and (suspected) exposure to other viral communicable diseases: Secondary | ICD-10-CM | POA: Diagnosis not present

## 2019-01-26 DIAGNOSIS — R54 Age-related physical debility: Secondary | ICD-10-CM | POA: Diagnosis not present

## 2019-01-27 DIAGNOSIS — I509 Heart failure, unspecified: Secondary | ICD-10-CM | POA: Diagnosis not present

## 2019-01-27 DIAGNOSIS — I4891 Unspecified atrial fibrillation: Secondary | ICD-10-CM | POA: Diagnosis not present

## 2019-01-27 DIAGNOSIS — I129 Hypertensive chronic kidney disease with stage 1 through stage 4 chronic kidney disease, or unspecified chronic kidney disease: Secondary | ICD-10-CM | POA: Diagnosis not present

## 2019-01-27 DIAGNOSIS — N1831 Chronic kidney disease, stage 3a: Secondary | ICD-10-CM | POA: Diagnosis not present

## 2019-02-02 DIAGNOSIS — E039 Hypothyroidism, unspecified: Secondary | ICD-10-CM | POA: Diagnosis not present

## 2019-02-02 DIAGNOSIS — E785 Hyperlipidemia, unspecified: Secondary | ICD-10-CM | POA: Diagnosis not present

## 2019-02-02 DIAGNOSIS — E119 Type 2 diabetes mellitus without complications: Secondary | ICD-10-CM | POA: Diagnosis not present

## 2019-02-02 DIAGNOSIS — Z20828 Contact with and (suspected) exposure to other viral communicable diseases: Secondary | ICD-10-CM | POA: Diagnosis not present

## 2019-02-02 DIAGNOSIS — I10 Essential (primary) hypertension: Secondary | ICD-10-CM | POA: Diagnosis not present

## 2019-02-02 DIAGNOSIS — D649 Anemia, unspecified: Secondary | ICD-10-CM | POA: Diagnosis not present

## 2019-02-02 DIAGNOSIS — R54 Age-related physical debility: Secondary | ICD-10-CM | POA: Diagnosis not present

## 2019-02-09 DIAGNOSIS — Z20828 Contact with and (suspected) exposure to other viral communicable diseases: Secondary | ICD-10-CM | POA: Diagnosis not present

## 2019-02-09 DIAGNOSIS — R54 Age-related physical debility: Secondary | ICD-10-CM | POA: Diagnosis not present

## 2019-02-14 DIAGNOSIS — R2689 Other abnormalities of gait and mobility: Secondary | ICD-10-CM | POA: Diagnosis not present

## 2019-02-14 DIAGNOSIS — M6281 Muscle weakness (generalized): Secondary | ICD-10-CM | POA: Diagnosis not present

## 2019-02-14 DIAGNOSIS — R269 Unspecified abnormalities of gait and mobility: Secondary | ICD-10-CM | POA: Diagnosis not present

## 2019-02-14 DIAGNOSIS — U071 COVID-19: Secondary | ICD-10-CM | POA: Diagnosis not present

## 2019-02-15 DIAGNOSIS — I5030 Unspecified diastolic (congestive) heart failure: Secondary | ICD-10-CM | POA: Diagnosis not present

## 2019-02-15 DIAGNOSIS — I5020 Unspecified systolic (congestive) heart failure: Secondary | ICD-10-CM | POA: Diagnosis not present

## 2019-02-15 DIAGNOSIS — J449 Chronic obstructive pulmonary disease, unspecified: Secondary | ICD-10-CM | POA: Diagnosis not present

## 2019-02-16 DIAGNOSIS — R0989 Other specified symptoms and signs involving the circulatory and respiratory systems: Secondary | ICD-10-CM | POA: Diagnosis not present

## 2019-02-16 DIAGNOSIS — R509 Fever, unspecified: Secondary | ICD-10-CM | POA: Diagnosis not present

## 2019-02-16 DIAGNOSIS — U071 COVID-19: Secondary | ICD-10-CM | POA: Diagnosis not present

## 2019-02-16 DIAGNOSIS — R05 Cough: Secondary | ICD-10-CM | POA: Diagnosis not present

## 2019-02-26 ENCOUNTER — Inpatient Hospital Stay (HOSPITAL_COMMUNITY)
Admission: AD | Admit: 2019-02-26 | Discharge: 2019-03-03 | DRG: 177 | Disposition: A | Discharge status: Hospice | Payer: PPO | Source: Other Acute Inpatient Hospital | Attending: Family Medicine | Admitting: Family Medicine

## 2019-02-26 DIAGNOSIS — Z87891 Personal history of nicotine dependence: Secondary | ICD-10-CM

## 2019-02-26 DIAGNOSIS — J9601 Acute respiratory failure with hypoxia: Secondary | ICD-10-CM | POA: Diagnosis not present

## 2019-02-26 DIAGNOSIS — Z66 Do not resuscitate: Secondary | ICD-10-CM | POA: Diagnosis present

## 2019-02-26 DIAGNOSIS — Z8249 Family history of ischemic heart disease and other diseases of the circulatory system: Secondary | ICD-10-CM | POA: Diagnosis not present

## 2019-02-26 DIAGNOSIS — I472 Ventricular tachycardia: Secondary | ICD-10-CM | POA: Diagnosis present

## 2019-02-26 DIAGNOSIS — Z515 Encounter for palliative care: Secondary | ICD-10-CM | POA: Diagnosis not present

## 2019-02-26 DIAGNOSIS — H9190 Unspecified hearing loss, unspecified ear: Secondary | ICD-10-CM | POA: Diagnosis not present

## 2019-02-26 DIAGNOSIS — I2510 Atherosclerotic heart disease of native coronary artery without angina pectoris: Secondary | ICD-10-CM | POA: Diagnosis not present

## 2019-02-26 DIAGNOSIS — Z7951 Long term (current) use of inhaled steroids: Secondary | ICD-10-CM | POA: Diagnosis not present

## 2019-02-26 DIAGNOSIS — F329 Major depressive disorder, single episode, unspecified: Secondary | ICD-10-CM | POA: Diagnosis present

## 2019-02-26 DIAGNOSIS — E785 Hyperlipidemia, unspecified: Secondary | ICD-10-CM | POA: Diagnosis present

## 2019-02-26 DIAGNOSIS — R0902 Hypoxemia: Secondary | ICD-10-CM | POA: Diagnosis not present

## 2019-02-26 DIAGNOSIS — I340 Nonrheumatic mitral (valve) insufficiency: Secondary | ICD-10-CM | POA: Diagnosis not present

## 2019-02-26 DIAGNOSIS — R069 Unspecified abnormalities of breathing: Secondary | ICD-10-CM | POA: Diagnosis not present

## 2019-02-26 DIAGNOSIS — I63511 Cerebral infarction due to unspecified occlusion or stenosis of right middle cerebral artery: Secondary | ICD-10-CM | POA: Diagnosis not present

## 2019-02-26 DIAGNOSIS — F0390 Unspecified dementia without behavioral disturbance: Secondary | ICD-10-CM | POA: Diagnosis present

## 2019-02-26 DIAGNOSIS — I4891 Unspecified atrial fibrillation: Secondary | ICD-10-CM | POA: Diagnosis not present

## 2019-02-26 DIAGNOSIS — J189 Pneumonia, unspecified organism: Secondary | ICD-10-CM | POA: Diagnosis not present

## 2019-02-26 DIAGNOSIS — Z8673 Personal history of transient ischemic attack (TIA), and cerebral infarction without residual deficits: Secondary | ICD-10-CM

## 2019-02-26 DIAGNOSIS — R918 Other nonspecific abnormal finding of lung field: Secondary | ICD-10-CM | POA: Diagnosis not present

## 2019-02-26 DIAGNOSIS — R4701 Aphasia: Secondary | ICD-10-CM | POA: Diagnosis present

## 2019-02-26 DIAGNOSIS — Z951 Presence of aortocoronary bypass graft: Secondary | ICD-10-CM

## 2019-02-26 DIAGNOSIS — I110 Hypertensive heart disease with heart failure: Secondary | ICD-10-CM | POA: Diagnosis present

## 2019-02-26 DIAGNOSIS — I6340 Cerebral infarction due to embolism of unspecified cerebral artery: Secondary | ICD-10-CM | POA: Insufficient documentation

## 2019-02-26 DIAGNOSIS — Z743 Need for continuous supervision: Secondary | ICD-10-CM | POA: Diagnosis not present

## 2019-02-26 DIAGNOSIS — I5030 Unspecified diastolic (congestive) heart failure: Secondary | ICD-10-CM | POA: Diagnosis not present

## 2019-02-26 DIAGNOSIS — G9341 Metabolic encephalopathy: Secondary | ICD-10-CM | POA: Diagnosis not present

## 2019-02-26 DIAGNOSIS — U071 COVID-19: Secondary | ICD-10-CM | POA: Diagnosis not present

## 2019-02-26 DIAGNOSIS — N400 Enlarged prostate without lower urinary tract symptoms: Secondary | ICD-10-CM | POA: Diagnosis present

## 2019-02-26 DIAGNOSIS — G936 Cerebral edema: Secondary | ICD-10-CM | POA: Diagnosis not present

## 2019-02-26 DIAGNOSIS — J1289 Other viral pneumonia: Secondary | ICD-10-CM | POA: Diagnosis not present

## 2019-02-26 DIAGNOSIS — Z7901 Long term (current) use of anticoagulants: Secondary | ICD-10-CM

## 2019-02-26 DIAGNOSIS — I6523 Occlusion and stenosis of bilateral carotid arteries: Secondary | ICD-10-CM | POA: Diagnosis not present

## 2019-02-26 DIAGNOSIS — I371 Nonrheumatic pulmonary valve insufficiency: Secondary | ICD-10-CM | POA: Diagnosis not present

## 2019-02-26 DIAGNOSIS — Z23 Encounter for immunization: Secondary | ICD-10-CM

## 2019-02-26 DIAGNOSIS — R0602 Shortness of breath: Secondary | ICD-10-CM | POA: Diagnosis not present

## 2019-02-26 DIAGNOSIS — Z79899 Other long term (current) drug therapy: Secondary | ICD-10-CM

## 2019-02-26 DIAGNOSIS — I482 Chronic atrial fibrillation, unspecified: Secondary | ICD-10-CM | POA: Diagnosis present

## 2019-02-26 DIAGNOSIS — K219 Gastro-esophageal reflux disease without esophagitis: Secondary | ICD-10-CM | POA: Diagnosis present

## 2019-02-26 DIAGNOSIS — I6389 Other cerebral infarction: Secondary | ICD-10-CM | POA: Diagnosis not present

## 2019-02-26 DIAGNOSIS — J1282 Pneumonia due to coronavirus disease 2019: Secondary | ICD-10-CM | POA: Diagnosis not present

## 2019-02-26 DIAGNOSIS — I639 Cerebral infarction, unspecified: Secondary | ICD-10-CM | POA: Diagnosis not present

## 2019-02-26 DIAGNOSIS — R1310 Dysphagia, unspecified: Secondary | ICD-10-CM | POA: Diagnosis present

## 2019-02-26 DIAGNOSIS — J159 Unspecified bacterial pneumonia: Secondary | ICD-10-CM | POA: Diagnosis not present

## 2019-02-26 DIAGNOSIS — Z7982 Long term (current) use of aspirin: Secondary | ICD-10-CM

## 2019-02-26 DIAGNOSIS — G8194 Hemiplegia, unspecified affecting left nondominant side: Secondary | ICD-10-CM | POA: Diagnosis not present

## 2019-02-26 DIAGNOSIS — R410 Disorientation, unspecified: Secondary | ICD-10-CM | POA: Diagnosis not present

## 2019-02-26 DIAGNOSIS — T380X5A Adverse effect of glucocorticoids and synthetic analogues, initial encounter: Secondary | ICD-10-CM | POA: Diagnosis present

## 2019-02-26 DIAGNOSIS — R739 Hyperglycemia, unspecified: Secondary | ICD-10-CM | POA: Diagnosis present

## 2019-02-26 DIAGNOSIS — R29725 NIHSS score 25: Secondary | ICD-10-CM | POA: Diagnosis present

## 2019-02-26 DIAGNOSIS — R7303 Prediabetes: Secondary | ICD-10-CM | POA: Diagnosis present

## 2019-02-26 LAB — GLUCOSE, CAPILLARY: Glucose-Capillary: 116 mg/dL — ABNORMAL HIGH (ref 70–99)

## 2019-02-26 MED ORDER — FLUTICASONE FUROATE-VILANTEROL 100-25 MCG/INH IN AEPB
1.0000 | INHALATION_SPRAY | Freq: Every day | RESPIRATORY_TRACT | Status: DC
  Administered 2019-02-27 – 2019-03-03 (×4): 1 via RESPIRATORY_TRACT
  Filled 2019-02-26: qty 28

## 2019-02-26 MED ORDER — MIRTAZAPINE 15 MG PO TABS
30.0000 mg | ORAL_TABLET | Freq: Every day | ORAL | Status: DC
  Administered 2019-02-26 – 2019-02-28 (×3): 30 mg via ORAL
  Filled 2019-02-26 (×3): qty 2

## 2019-02-26 MED ORDER — VITAMIN D 25 MCG (1000 UNIT) PO TABS
2000.0000 [IU] | ORAL_TABLET | Freq: Every day | ORAL | Status: DC
  Administered 2019-02-27 – 2019-03-01 (×3): 2000 [IU] via ORAL
  Filled 2019-02-26 (×3): qty 2

## 2019-02-26 MED ORDER — ASPIRIN 81 MG PO TBEC
81.0000 mg | DELAYED_RELEASE_TABLET | Freq: Every day | ORAL | Status: DC
  Administered 2019-02-27 – 2019-03-01 (×3): 81 mg via ORAL
  Filled 2019-02-26 (×8): qty 1

## 2019-02-26 MED ORDER — TAMSULOSIN HCL 0.4 MG PO CAPS
0.4000 mg | ORAL_CAPSULE | Freq: Every day | ORAL | Status: DC
  Administered 2019-02-26 – 2019-02-28 (×3): 0.4 mg via ORAL
  Filled 2019-02-26 (×3): qty 1

## 2019-02-26 MED ORDER — SODIUM CHLORIDE 0.9 % IV SOLN
100.0000 mg | Freq: Every day | INTRAVENOUS | Status: AC
  Administered 2019-02-27 – 2019-03-02 (×4): 100 mg via INTRAVENOUS
  Filled 2019-02-26 (×5): qty 20

## 2019-02-26 MED ORDER — CARVEDILOL 3.125 MG PO TABS
6.2500 mg | ORAL_TABLET | Freq: Two times a day (BID) | ORAL | Status: DC
  Administered 2019-02-27 – 2019-03-01 (×5): 6.25 mg via ORAL
  Filled 2019-02-26 (×5): qty 2

## 2019-02-26 MED ORDER — HYDROCOD POLST-CPM POLST ER 10-8 MG/5ML PO SUER: 5.0000 mL | Freq: Two times a day (BID) | ORAL | Status: DC | PRN

## 2019-02-26 MED ORDER — LORATADINE 10 MG PO TABS
10.0000 mg | ORAL_TABLET | Freq: Every day | ORAL | Status: DC
  Administered 2019-02-27 – 2019-03-01 (×3): 10 mg via ORAL
  Filled 2019-02-26 (×3): qty 1

## 2019-02-26 MED ORDER — DEXAMETHASONE SODIUM PHOSPHATE 10 MG/ML IJ SOLN: 6.0000 mg | INTRAMUSCULAR | Status: DC

## 2019-02-26 MED ORDER — FUROSEMIDE 20 MG PO TABS
20.0000 mg | ORAL_TABLET | Freq: Every day | ORAL | Status: DC
  Administered 2019-02-27 – 2019-03-01 (×3): 20 mg via ORAL
  Filled 2019-02-26 (×3): qty 1

## 2019-02-26 MED ORDER — VITAMIN D3 50 MCG (2000 UT) PO TABS: 1.0000 | ORAL_TABLET | Freq: Every day | ORAL | Status: DC

## 2019-02-26 MED ORDER — VITAMIN B-12 1000 MCG PO TABS
1000.0000 ug | ORAL_TABLET | Freq: Every day | ORAL | Status: DC
  Administered 2019-02-27 – 2019-03-01 (×3): 1000 ug via ORAL
  Filled 2019-02-26 (×3): qty 1

## 2019-02-26 MED ORDER — SODIUM CHLORIDE 0.9 % IV SOLN
500.0000 mg | INTRAVENOUS | Status: DC
  Administered 2019-02-27 – 2019-03-03 (×5): 500 mg via INTRAVENOUS
  Filled 2019-02-26 (×5): qty 500

## 2019-02-26 MED ORDER — SENNOSIDES-DOCUSATE SODIUM 8.6-50 MG PO TABS: 1.0000 | ORAL_TABLET | Freq: Every evening | ORAL | Status: DC | PRN

## 2019-02-26 MED ORDER — BISACODYL 10 MG RE SUPP: 10.0000 mg | RECTAL | Status: DC | PRN

## 2019-02-26 MED ORDER — IPRATROPIUM-ALBUTEROL 20-100 MCG/ACT IN AERS
1.0000 | INHALATION_SPRAY | Freq: Four times a day (QID) | RESPIRATORY_TRACT | Status: DC
  Administered 2019-02-27 – 2019-03-03 (×14): 1 via RESPIRATORY_TRACT
  Filled 2019-02-26: qty 4

## 2019-02-26 MED ORDER — FINASTERIDE 5 MG PO TABS
5.0000 mg | ORAL_TABLET | Freq: Every day | ORAL | Status: DC
  Administered 2019-02-27 – 2019-03-01 (×3): 5 mg via ORAL
  Filled 2019-02-26 (×5): qty 1

## 2019-02-26 MED ORDER — NITROGLYCERIN 0.4 MG SL SUBL: 0.4000 mg | SUBLINGUAL_TABLET | SUBLINGUAL | Status: DC | PRN

## 2019-02-26 MED ORDER — INSULIN ASPART 100 UNIT/ML ~~LOC~~ SOLN
0.0000 [IU] | SUBCUTANEOUS | Status: DC
  Administered 2019-02-27 (×2): 3 [IU] via SUBCUTANEOUS
  Administered 2019-02-27: 21:00:00 2 [IU] via SUBCUTANEOUS
  Administered 2019-02-28: 17:00:00 3 [IU] via SUBCUTANEOUS
  Administered 2019-02-28 – 2019-03-02 (×10): 2 [IU] via SUBCUTANEOUS

## 2019-02-26 MED ORDER — ATORVASTATIN CALCIUM 40 MG PO TABS
40.0000 mg | ORAL_TABLET | Freq: Every day | ORAL | Status: DC
  Administered 2019-02-26 – 2019-02-28 (×3): 40 mg via ORAL
  Filled 2019-02-26 (×3): qty 1

## 2019-02-26 MED ORDER — PANTOPRAZOLE SODIUM 40 MG PO TBEC
40.0000 mg | DELAYED_RELEASE_TABLET | Freq: Every day | ORAL | Status: DC
  Administered 2019-02-27 – 2019-03-01 (×3): 40 mg via ORAL
  Filled 2019-02-26 (×3): qty 1

## 2019-02-26 MED ORDER — SODIUM CHLORIDE 0.9 % IV SOLN
1.0000 g | INTRAVENOUS | Status: DC
  Administered 2019-02-27 – 2019-03-02 (×4): 1 g via INTRAVENOUS
  Filled 2019-02-26 (×5): qty 10

## 2019-02-26 MED ORDER — LOPERAMIDE HCL 2 MG PO CAPS: 2.0000 mg | ORAL_CAPSULE | ORAL | Status: DC | PRN

## 2019-02-26 MED ORDER — GUAIFENESIN-DM 100-10 MG/5ML PO SYRP: 10.0000 mL | ORAL_SOLUTION | ORAL | Status: DC | PRN

## 2019-02-26 MED ORDER — APIXABAN 5 MG PO TABS
5.0000 mg | ORAL_TABLET | Freq: Two times a day (BID) | ORAL | Status: DC
  Administered 2019-02-27 – 2019-03-01 (×5): 5 mg via ORAL
  Filled 2019-02-26 (×5): qty 1

## 2019-02-26 MED ORDER — ACETAMINOPHEN 325 MG PO TABS: 650.0000 mg | ORAL_TABLET | Freq: Four times a day (QID) | ORAL | Status: DC | PRN

## 2019-02-26 MED ORDER — DOCUSATE SODIUM 100 MG PO CAPS
100.0000 mg | ORAL_CAPSULE | Freq: Two times a day (BID) | ORAL | Status: DC
  Administered 2019-02-26 – 2019-03-01 (×6): 100 mg via ORAL
  Filled 2019-02-26 (×6): qty 1

## 2019-02-26 MED ORDER — GUAIFENESIN ER 600 MG PO TB12
600.0000 mg | ORAL_TABLET | Freq: Two times a day (BID) | ORAL | Status: DC
  Administered 2019-02-26 – 2019-03-01 (×6): 600 mg via ORAL
  Filled 2019-02-26 (×6): qty 1

## 2019-02-26 NOTE — Progress Notes (Signed)
The patient received the following medications today at [facility] per discussion with their Pharmacist: - Remdesivir 200mg  IV at 08:56 - Dexamethasone 6mg  IV at 08:10 - Azithromycin 500mg  IV at 11:04 - Ceftriaxone 1g IV at 12:29 - Lovenox 65mg  SQ at 18:03  Peggyann Juba, PharmD, BCPS 02/26/2019 8:35 PM

## 2019-02-26 NOTE — Progress Notes (Signed)
Received report from [facility].  Patient arrived around 24.  Placed patient on telemetry and obtained vital sings.  Called CCMD and connect patient account with cardiac monitor.  Patient alert oriented to self.  He asked for cold water to moisten his mouth.  Gave shift report to [employer].  Patient in A-fib as stated on report.

## 2019-02-26 NOTE — H&P (Signed)
History and Physical    Dave Harris [id number] DOB: [birth year] DOA: 02/26/2019  PCP: Garwin Brothers, MD  Patient coming from: [facility]  I have personally briefly reviewed patient's old medical records in [place]  Chief Complaint: SOB  HPI: Dave Harris is a 86 y.o. male with medical history significant of CVA, CAD, Atrial Fibrillation on Apixaban, Carotid artery stenosis, HTN, HLD, BPH and Depression, who initially presented from his ALF to [facility] ED with hypoxemic respiratory failure with initial oxygen sats of 60s.  Patient was placed on NRB, given Remdesivir, Dexamethasone as well as antibiotics prior to transfer.  Patient is very hard of hearing.  He is able to state he has had symptoms for about 1 week but otherwise does not answer questions well.  He repeatedly states he cannot hear.  He denies any pain.  He denies any fevers.  He denies diarrhea.   Review of Systems: As per HPI otherwise 10 point review of systems negative.    Past Medical History:  Diagnosis Date  . A-fib ([place]) 06/10/2012  . Allergy   . Bilateral carotid artery stenosis 09/03/2016  . Bruit 06/10/2012  . CAD (coronary artery disease)   . Carotid stenosis   . Cerebrovascular accident (CVA) due to embolism of left middle cerebral artery ([place]) 08/22/2015  . CHF (congestive heart failure) ([place])   . Chronic anticoagulation 08/22/2015  . Chronic atrial fibrillation ([place])   . Complication of anesthesia    "sometimes I'm hard to wake up (05/23/2015)  . Coronary artery disease involving native coronary artery of native heart with angina pectoris ([place]) 07/21/2014  . CVA (cerebral infarction) 05/22/2015  . Depression   . Essential hypertension 07/21/2014  . Grief reaction 12/11/2015  . Heart disease   . Heart disease   . History of blood transfusion    "w/my heart OR"  . History of stomach ulcers "sever"  . HLD (hyperlipidemia)   . Hyperlipidemia   . Hypertension   .  Myocardial infarction ([place])   . Pure hypercholesterolemia 07/21/2014  . S/P carotid endarterectomy 06/19/2015  . Stroke [facility]) several   "6 since June 2016; last one was 05/19/2015"; denies residual  (05/23/2015)    Past Surgical History:  Procedure Laterality Date  . BACK SURGERY    . CARDIAC CATHETERIZATION    . CARPAL TUNNEL RELEASE Bilateral   . CATARACT EXTRACTION W/ INTRAOCULAR LENS  IMPLANT, BILATERAL Bilateral   . COLON SURGERY    . CORONARY ARTERY BYPASS GRAFT  1978   CABG X"?3"  . ENDARTERECTOMY Left 05/24/2015   Procedure: Left ENDARTERECTOMY CAROTID;  Surgeon: Angelia Mould, MD;  Location: [facility];  Service: Vascular;  Laterality: Left;  . LUMBAR DISC SURGERY     "had 4 ruptured discs"  . MULTIPLE TOOTH EXTRACTIONS    . STOMACH SURGERY     "had 1/2 my stomach removed; had several stomach ulcers"  . TONSILLECTOMY AND ADENOIDECTOMY  ~ 1947  . TRANSURETHRAL RESECTION OF PROSTATE  X 3   "never removed it" (05/22/2015)     reports that he has quit smoking. His smoking use included cigarettes. He has a 60.00 pack-year smoking history. He has never used smokeless tobacco. He reports that he does not drink alcohol or use drugs.  Allergies  Allergen Reactions  . Plavix [Clopidogrel Bisulfate] Other (See Comments)    PT STATES MED DID NOT MAKE HIM FEEL WELL  . Xarelto [Rivaroxaban] Other (See Comments)    bleeding  Family History  Problem Relation Age of Onset  . CAD Other   . Heart disease Brother        before age 48  . Heart attack Brother     Prior to Admission medications   Medication Sig Start Date End Date Taking? Authorizing Provider  acetaminophen (TYLENOL) 325 MG tablet Take 650 mg by mouth every 6 (six) hours as needed.    [provider]  apixaban (ELIQUIS) 5 MG TABS tablet Take 5 mg by mouth 2 (two) times daily.    [provider]  aspirin 81 MG tablet Take 81 mg by mouth daily.    [provider]  atorvastatin (LIPITOR) 40  MG tablet Take 40 mg by mouth daily.  07/23/14   [provider]  bisacodyl (DULCOLAX) 10 MG suppository Place 10 mg rectally as needed for moderate constipation.    [provider]  BREO ELLIPTA 100-25 MCG/INH AEPB Take 1 puff by mouth daily. 11/03/17   [provider]  carvedilol (COREG) 6.25 MG tablet Take 6.25 mg by mouth 2 (two) times daily with a meal.    [provider]  Cholecalciferol (VITAMIN D3) 2000 units TABS Take 1 tablet by mouth daily.     [provider]  Dextromethorphan-Guaifenesin (ROBITUSSIN DM) 10-100 MG/5ML liquid Take 5 mLs by mouth as needed.     [provider]  docusate sodium (COLACE) 100 MG capsule Take 100 mg by mouth 2 (two) times daily.  05/26/15   [provider]  fexofenadine (ALLEGRA) 180 MG tablet Take 180 mg by mouth daily.    [provider]  finasteride (PROSCAR) 5 MG tablet Take 5 mg by mouth daily.    [provider]  furosemide (LASIX) 20 MG tablet Take 20 mg by mouth daily.     [provider]  guaiFENesin (MUCINEX) 600 MG 12 hr tablet Take by mouth 2 (two) times daily.    [provider]  HYDROcodone-acetaminophen (NORCO/VICODIN) 5-325 MG tablet Take 1 tablet by mouth daily.  05/26/15   [provider]  ipratropium-albuterol (DUONEB) 0.5-2.5 (3) MG/3ML SOLN Inhale 3 mLs into the lungs QID. 11/02/17   [provider]  LINZESS 145 MCG CAPS capsule Take 145 mcg by mouth once a week.  08/13/15   [provider]  loperamide (IMODIUM A-D) 2 MG capsule Take by mouth as needed for diarrhea or loose stools.    [provider]  mirtazapine (REMERON) 30 MG tablet Take 30 mg by mouth at bedtime.     [provider]  Multiple Vitamins-Minerals (PRESERVISION AREDS PO) Take 1 tablet by mouth every morning.    [provider]  nitroGLYCERIN (NITROSTAT) 0.4 MG SL tablet Place 0.4 mg under the tongue every 5 (five) minutes as  needed for chest pain.     [provider]  pantoprazole (PROTONIX) 40 MG tablet Take 1 tablet (40 mg total) by mouth daily. 05/26/15   Rai, Ripudeep K, MD  senna-docusate (SENNA-PLUS) 8.6-50 MG tablet Take 1 tablet by mouth 2 (two) times daily.     [provider]  tamsulosin (FLOMAX) 0.4 MG CAPS capsule Take 0.4 mg by mouth daily.     [provider]  vitamin B-12 (CYANOCOBALAMIN) 1000 MCG tablet Take 1,000 mcg by mouth daily.     [provider]    Physical Exam: Vitals:   02/26/19 1859 02/26/19 1935  BP: 122/68 128/63  Pulse: 67 68  Resp: (!) 23 (!) 24  Temp: 98.5 F (36.9 C) 98.2 F (36.8 C)  TempSrc: Axillary Axillary  SpO2: 98% 98%    Vitals:   02/26/19 1859 02/26/19 1935  BP: 122/68 128/63  Pulse: 67 68  Resp: (!) 23 (!) 24  Temp: 98.5 F (36.9 C) 98.2 F (36.8 C)  TempSrc: Axillary Axillary  SpO2: 98% 98%    Constitutional: NAD, resting comfortably wearing non-rebreather, frail in appearance Eyes: PERRL, EOMI ENMT: MMM, atraumatic Neck: normal, supple, no masses, no thyromegaly Respiratory:  Shallow breaths, clear, exam limited by PPE Cardiovascular: irregular rate and rhythm, no murmurs / rubs / gallops. No extremity edema. 2+ pedal pulses. No carotid bruits.  Abdomen: no tenderness, no masses palpated. No hepatosplenomegaly. Bowel sounds positive.  Musculoskeletal: no clubbing / cyanosis. No joint deformity upper and lower extremities. Good ROM, no contractures. Normal muscle tone.  Skin: no rashes, lesions, ulcers. No induration Neurologic: Limited exam due to patient hearing and subsequent participation.  No gross deficits appreciated. Psychiatric: Guarded judgment and insight.    Labs on Admission: I have personally reviewed following labs and imaging studies  CBC: No results for input(s): WBC, NEUTROABS, HGB, HCT, MCV, PLT in the last 168 hours. Basic Metabolic Panel: No results for input(s): NA, K, CL, CO2, GLUCOSE,  BUN, CREATININE, CALCIUM, MG, PHOS in the last 168 hours. GFR: CrCl cannot be calculated (Patient's most recent lab result is older than the maximum 21 days allowed.). Liver Function Tests: No results for input(s): AST, ALT, ALKPHOS, BILITOT, PROT, ALBUMIN in the last 168 hours. No results for input(s): LIPASE, AMYLASE in the last 168 hours. No results for input(s): AMMONIA in the last 168 hours. Coagulation Profile: No results for input(s): INR, PROTIME in the last 168 hours. Cardiac Enzymes: No results for input(s): CKTOTAL, CKMB, CKMBINDEX, TROPONINI in the last 168 hours. BNP (last 3 results) No results for input(s): PROBNP in the last 8760 hours. HbA1C: No results for input(s): HGBA1C in the last 72 hours. CBG: Recent Labs  Lab 02/26/19 1943  GLUCAP 116*   Lipid Profile: No results for input(s): CHOL, HDL, LDLCALC, TRIG, CHOLHDL, LDLDIRECT in the last 72 hours. Thyroid Function Tests: No results for input(s): TSH, T4TOTAL, FREET4, T3FREE, THYROIDAB in the last 72 hours. Anemia Panel: No results for input(s): VITAMINB12, FOLATE, FERRITIN, TIBC, IRON, RETICCTPCT in the last 72 hours. Urine analysis:    Component Value Date/Time   COLORURINE YELLOW 05/23/2015 1829   APPEARANCEUR CLEAR 05/23/2015 1829   LABSPEC 1.013 05/23/2015 1829   PHURINE 6.0 05/23/2015 1829   GLUCOSEU NEGATIVE 05/23/2015 1829   HGBUR NEGATIVE 05/23/2015 1829   BILIRUBINUR NEGATIVE 05/23/2015 1829   KETONESUR NEGATIVE 05/23/2015 1829   PROTEINUR NEGATIVE 05/23/2015 1829   NITRITE NEGATIVE 05/23/2015 1829   LEUKOCYTESUR NEGATIVE 05/23/2015 1829    Radiological Exams on Admission: No results found.  Assessment/Plan Dave Harris is a 86 y.o. male with medical history significant of CVA, CAD, Atrial Fibrillation on Apixaban, Carotid artery stenosis, HTN, HLD, BPH and Depression, who initially presented from his ALF to [facility] ED with hypoxemic respiratory failure with initial oxygen  sats of 60s 2/2 COVID Pneumonia.  # Acute Hypoxemic Respiratory Failure # COVID pneumonia - patient currently on non-rebreather, difficult to obtain Hx/mostly done in form of yes/no questions and unclear how reliable his answers are (either due to poor hearing or cognitive impairment).  Limited collateral hx at this time.  ER prior to transfer confirmed DNR status. - for now continue on Remdesivir and Dexamethasone -  oxygen supplementation - will continue Ceftriaxone and Azithromycin, procalcitonin pending (mildly elevated at [facility])  # CAD # CHF # Chronic Atrial Fibrillation - mildly elevated troponin which I suspect is demand, no chest pain reported, EKG ordered but low suspicion for ACS, BNP prior to arrival 4500 (but does not appear significantly overloaded) - continue aspirin, apixaban, atorvastatin, carvedilol, lasix  # Hx of CVA (05/2015) - L MCA, s/p L CEA - on apixaban, atorvastatin - exam limited so difficult to assess motor/sensory deficits, limited in communication as well which may be due to hearing impairment and acute illness - was seen by Dr. Erlinda Hong in 08/2016, no focal deficits reported, but documented hearing loss   # HTN - continue carvedilol  # GERD - continue PPI  # Depression - continue mirtazapine  # BPH - continue tamsulosin and finasteride   DVT prophylaxis: Continue Apixaban Code Status: DNR/DNI Family Communication: Need touch base in AM Admission status: PCU   Truddie Hidden MD [facility] Pager [phone]  If 7PM-7AM, please contact night-coverage [email or web address] Password [facility]  02/26/2019, 10:29 PM

## 2019-02-27 ENCOUNTER — Other Ambulatory Visit: Payer: Self-pay

## 2019-02-27 ENCOUNTER — Encounter (HOSPITAL_COMMUNITY): Payer: Self-pay | Admitting: Internal Medicine

## 2019-02-27 ENCOUNTER — Inpatient Hospital Stay (HOSPITAL_COMMUNITY): Payer: PPO

## 2019-02-27 DIAGNOSIS — I371 Nonrheumatic pulmonary valve insufficiency: Secondary | ICD-10-CM

## 2019-02-27 DIAGNOSIS — I340 Nonrheumatic mitral (valve) insufficiency: Secondary | ICD-10-CM

## 2019-02-27 LAB — COMPREHENSIVE METABOLIC PANEL
ALT: 23 U/L (ref 0–44)
ALT: 24 U/L (ref 0–44)
AST: 37 U/L (ref 15–41)
AST: 37 U/L (ref 15–41)
Albumin: 2.8 g/dL — ABNORMAL LOW (ref 3.5–5.0)
Albumin: 2.8 g/dL — ABNORMAL LOW (ref 3.5–5.0)
Alkaline Phosphatase: 87 U/L (ref 38–126)
Alkaline Phosphatase: 99 U/L (ref 38–126)
Anion gap: 12 (ref 5–15)
Anion gap: 17 — ABNORMAL HIGH (ref 5–15)
BUN: 39 mg/dL — ABNORMAL HIGH (ref 8–23)
BUN: 40 mg/dL — ABNORMAL HIGH (ref 8–23)
CO2: 22 mmol/L (ref 22–32)
CO2: 21 mmol/L — ABNORMAL LOW (ref 22–32)
Calcium: 8.7 mg/dL — ABNORMAL LOW (ref 8.9–10.3)
Calcium: 9 mg/dL (ref 8.9–10.3)
Chloride: 103 mmol/L (ref 98–111)
Chloride: 102 mmol/L (ref 98–111)
Creatinine, Ser: 1.2 mg/dL (ref 0.61–1.24)
Creatinine, Ser: 1.21 mg/dL (ref 0.61–1.24)
GFR calc Af Amer: >60 mL/min (ref >60)
GFR calc Af Amer: >60 mL/min (ref >60)
GFR calc non Af Amer: 54 mL/min — ABNORMAL LOW (ref >60)
GFR calc non Af Amer: 54 mL/min — ABNORMAL LOW (ref >60)
Glucose, Bld: 98 mg/dL (ref 70–99)
Glucose, Bld: 76 mg/dL (ref 70–99)
Potassium: 3.8 mmol/L (ref 3.5–5.1)
Potassium: 3.9 mmol/L (ref 3.5–5.1)
Sodium: 137 mmol/L (ref 135–145)
Sodium: 140 mmol/L (ref 135–145)
Total Bilirubin: 1.1 mg/dL (ref 0.3–1.2)
Total Bilirubin: 1 mg/dL (ref 0.3–1.2)
Total Protein: 7 g/dL (ref 6.5–8.1)
Total Protein: 7.2 g/dL (ref 6.5–8.1)

## 2019-02-27 LAB — CBC WITH DIFFERENTIAL/PLATELET
Abs Immature Granulocytes: 0.11 10*3/uL — ABNORMAL HIGH (ref 0.00–0.07)
Abs Immature Granulocytes: 0.29 10*3/uL — ABNORMAL HIGH (ref 0.00–0.07)
Basophils Absolute: 0 10*3/uL (ref 0.0–0.1)
Basophils Absolute: 0 10*3/uL (ref 0.0–0.1)
Basophils Relative: 0 %
Basophils Relative: 0 %
Eosinophils Absolute: 0 10*3/uL (ref 0.0–0.5)
Eosinophils Absolute: 0 10*3/uL (ref 0.0–0.5)
Eosinophils Relative: 0 %
Eosinophils Relative: 0 %
HCT: 43.4 % (ref 39.0–52.0)
HCT: 43.8 % (ref 39.0–52.0)
Hemoglobin: 13.8 g/dL (ref 13.0–17.0)
Hemoglobin: 13.9 g/dL (ref 13.0–17.0)
Immature Granulocytes: 1 %
Immature Granulocytes: 2 %
Lymphocytes Relative: 5 %
Lymphocytes Relative: 6 %
Lymphs Abs: 0.7 10*3/uL (ref 0.7–4.0)
Lymphs Abs: 0.8 10*3/uL (ref 0.7–4.0)
MCH: 25.9 pg — ABNORMAL LOW (ref 26.0–34.0)
MCH: 26.1 pg (ref 26.0–34.0)
MCHC: 31.5 g/dL (ref 30.0–36.0)
MCHC: 32 g/dL (ref 30.0–36.0)
MCV: 81.4 fL (ref 80.0–100.0)
MCV: 82.2 fL (ref 80.0–100.0)
Monocytes Absolute: 0.3 10*3/uL (ref 0.1–1.0)
Monocytes Absolute: 0.3 10*3/uL (ref 0.1–1.0)
Monocytes Relative: 2 %
Monocytes Relative: 2 %
Neutro Abs: 11.3 10*3/uL — ABNORMAL HIGH (ref 1.7–7.7)
Neutro Abs: 12.5 10*3/uL — ABNORMAL HIGH (ref 1.7–7.7)
Neutrophils Relative %: 90 %
Neutrophils Relative %: 92 %
Platelets: 253 10*3/uL (ref 150–400)
Platelets: 269 10*3/uL (ref 150–400)
RBC: 5.33 MIL/uL (ref 4.22–5.81)
RBC: 5.33 MIL/uL (ref 4.22–5.81)
RDW: 16.8 % — ABNORMAL HIGH (ref 11.5–15.5)
RDW: 17 % — ABNORMAL HIGH (ref 11.5–15.5)
WBC: 12.7 10*3/uL — ABNORMAL HIGH (ref 4.0–10.5)
WBC: 13.7 10*3/uL — ABNORMAL HIGH (ref 4.0–10.5)
nRBC: 0 % (ref 0.0–0.2)
nRBC: 0 % (ref 0.0–0.2)

## 2019-02-27 LAB — TROPONIN I (HIGH SENSITIVITY)
Troponin I (High Sensitivity): 113 ng/L (ref <18)
Troponin I (High Sensitivity): 128 ng/L (ref <18)
Troponin I (High Sensitivity): 137 ng/L (ref <18)

## 2019-02-27 LAB — D-DIMER, QUANTITATIVE
D-Dimer, Quant: 4.2 ug/mL-FEU — ABNORMAL HIGH (ref 0.00–0.50)
D-Dimer, Quant: 3.94 ug/mL-FEU — ABNORMAL HIGH (ref 0.00–0.50)

## 2019-02-27 LAB — MAGNESIUM: Magnesium: 2.2 mg/dL (ref 1.7–2.4)

## 2019-02-27 LAB — ABO/RH: ABO/RH(D): B POS

## 2019-02-27 LAB — ECHOCARDIOGRAM COMPLETE
Height: 64 in
Weight: 2141.11 oz

## 2019-02-27 LAB — FIBRINOGEN: Fibrinogen: 712 mg/dL — ABNORMAL HIGH (ref 210–475)

## 2019-02-27 LAB — PHOSPHORUS: Phosphorus: 3.2 mg/dL (ref 2.5–4.6)

## 2019-02-27 LAB — BRAIN NATRIURETIC PEPTIDE: B Natriuretic Peptide: 221.4 pg/mL — ABNORMAL HIGH (ref 0.0–100.0)

## 2019-02-27 LAB — FERRITIN
Ferritin: 119 ng/mL (ref 24–336)
Ferritin: 126 ng/mL (ref 24–336)

## 2019-02-27 LAB — GLUCOSE, CAPILLARY
Glucose-Capillary: 94 mg/dL (ref 70–99)
Glucose-Capillary: 101 mg/dL — ABNORMAL HIGH (ref 70–99)
Glucose-Capillary: 100 mg/dL — ABNORMAL HIGH (ref 70–99)
Glucose-Capillary: 157 mg/dL — ABNORMAL HIGH (ref 70–99)
Glucose-Capillary: 200 mg/dL — ABNORMAL HIGH (ref 70–99)
Glucose-Capillary: 144 mg/dL — ABNORMAL HIGH (ref 70–99)

## 2019-02-27 LAB — PROCALCITONIN: Procalcitonin: 4.16 ng/mL

## 2019-02-27 LAB — C-REACTIVE PROTEIN
CRP: 22.9 mg/dL — ABNORMAL HIGH (ref <1.0)
CRP: 25 mg/dL — ABNORMAL HIGH (ref <1.0)

## 2019-02-27 LAB — LACTATE DEHYDROGENASE: LDH: 335 U/L — ABNORMAL HIGH (ref 98–192)

## 2019-02-27 MED ORDER — METHYLPREDNISOLONE SODIUM SUCC 40 MG IJ SOLR
40.0000 mg | Freq: Two times a day (BID) | INTRAMUSCULAR | Status: DC
  Administered 2019-02-27 (×2): 40 mg via INTRAVENOUS
  Filled 2019-02-27 (×3): qty 1

## 2019-02-27 MED ORDER — RESOURCE THICKENUP CLEAR PO POWD
ORAL | Status: DC | PRN
  Filled 2019-02-27 (×2): qty 125

## 2019-02-27 NOTE — Evaluation (Signed)
Clinical/Bedside Swallow Evaluation Patient Details  Name: Dave Harris MRN: [id number] Date of Birth: 07/15/1929  Today's Date: 02/27/2019 Time: SLP Start Time (ACUTE ONLY): [id number] SLP Stop Time (ACUTE ONLY): [id number] SLP Time Calculation (min) (ACUTE ONLY): 14 min  Past Medical History:  Past Medical History:  Diagnosis Date  . A-fib ([place]) 06/10/2012  . Allergy   . Bilateral carotid artery stenosis 09/03/2016  . Bruit 06/10/2012  . CAD (coronary artery disease)   . Carotid stenosis   . Cerebrovascular accident (CVA) due to embolism of left middle cerebral artery ([place]) 08/22/2015  . CHF (congestive heart failure) ([place])   . Chronic anticoagulation 08/22/2015  . Chronic atrial fibrillation ([place])   . Complication of anesthesia    "sometimes I'm hard to wake up (05/23/2015)  . Coronary artery disease involving native coronary artery of native heart with angina pectoris ([place]) 07/21/2014  . CVA (cerebral infarction) 05/22/2015  . Depression   . Essential hypertension 07/21/2014  . Grief reaction 12/11/2015  . Heart disease   . Heart disease   . History of blood transfusion    "w/my heart OR"  . History of stomach ulcers "sever"  . HLD (hyperlipidemia)   . Hyperlipidemia   . Hypertension   . Myocardial infarction ([place])   . Pure hypercholesterolemia 07/21/2014  . S/P carotid endarterectomy 06/19/2015  . Stroke [facility]) several   "6 since June 2016; last one was 05/19/2015"; denies residual  (05/23/2015)   Past Surgical History:  Past Surgical History:  Procedure Laterality Date  . BACK SURGERY    . CARDIAC CATHETERIZATION    . CARPAL TUNNEL RELEASE Bilateral   . CATARACT EXTRACTION W/ INTRAOCULAR LENS  IMPLANT, BILATERAL Bilateral   . COLON SURGERY    . CORONARY ARTERY BYPASS GRAFT  1978   CABG X"?3"  . ENDARTERECTOMY Left 05/24/2015   Procedure: Left ENDARTERECTOMY CAROTID;  Surgeon: Angelia Mould, MD;  Location: [place];  Service: Vascular;  Laterality: Left;  . LUMBAR DISC SURGERY      "had 4 ruptured discs"  . MULTIPLE TOOTH EXTRACTIONS    . STOMACH SURGERY     "had 1/2 my stomach removed; had several stomach ulcers"  . TONSILLECTOMY AND ADENOIDECTOMY  ~ 1947  . TRANSURETHRAL RESECTION OF PROSTATE  X 3   "never removed it" (05/22/2015)   HPI:  Dave Harris is a 86 y.o. male with medical history significant of CVA, CAD, Atrial Fibrillation on Apixaban, Carotid artery stenosis, HTN, HLD, BPH and Depression, who initially presented from his ALF to [facility] ED with hypoxemic respiratory failure with initial oxygen sats of 60s.  Patient was placed on NRB, given Remdesivir, Dexamethasone as well as antibiotics prior to transfer.  No prior hx of dysphagia is documented.    Assessment / Plan / Recommendation Clinical Impression  Pt was seen for a bedside swallow evaluation and he presents with oral dysphagia and suspected pharyngeal dysphagia.  Pt is very hard of hearing; however, he additionally refused to follow some commands, therefore unable to complete oral mechanism exam.  Pt was observed to have a weak and mildly congested cough upon SLP arrival.  He consumed trials of thin liquid, nectar-thick liquid, puree, and regular solids.  Trials were limited secondary to pt refusal.  Suspect delayed swallow initiation with thin liquid trials and pt was observed to have an immediate cough following 2/3 straw sips of thin liquid.  No clinical s/sx of aspiration were observed with nectar-thick liquid or puree trials.  Pt exhibited prolonged mastication with eventual bolus expectoration during regular solid trial and he refused additional soft solid trials.  Therefore, recommend Dysphagia 1 (puree) solids and nectar-thick liquids with medications whole or crushed in puree (per pt preference) and intermittent supervision to cue for compensatory strategies (listed below).  Pt may benefit from an instrumental swallow study to further evaluate his swallow function.  SLP will f/u  per POC.    SLP Visit Diagnosis: Dysphagia, unspecified (R13.10)    Aspiration Risk  Mild aspiration risk    Diet Recommendation Dysphagia 1 (Puree);Nectar-thick liquid   Liquid Administration via: Cup;Straw Medication Administration: Whole meds with puree Supervision: Staff to assist with self feeding;Intermittent supervision to cue for compensatory strategies Compensations: Slow rate;Small sips/bites Postural Changes: Seated upright at 90 degrees    Other  Recommendations Oral Care Recommendations: Oral care BID Other Recommendations: Remove water pitcher;Order thickener from pharmacy   Follow up Recommendations Skilled Nursing facility      Frequency and Duration min 2x/week  2 weeks       Prognosis Prognosis for Safe Diet Advancement: Fair Barriers to Reach Goals: Motivation      Swallow Study   General HPI: Dave Harris is a 86 y.o. male with medical history significant of CVA, CAD, Atrial Fibrillation on Apixaban, Carotid artery stenosis, HTN, HLD, BPH and Depression, who initially presented from his ALF to [facility] ED with hypoxemic respiratory failure with initial oxygen sats of 60s.  Patient was placed on NRB, given Remdesivir, Dexamethasone as well as antibiotics prior to transfer.  No prior hx of dysphagia is documented.  Type of Study: Bedside Swallow Evaluation Previous Swallow Assessment: None documented  Diet Prior to this Study: Regular;Thin liquids Temperature Spikes Noted: No Respiratory Status: Nasal cannula History of Recent Intubation: No Behavior/Cognition: Alert;Uncooperative;Requires cueing Oral Cavity Assessment: Other (comment)(Pt refused to open his mouth ) Oral Care Completed by SLP: No Oral Cavity - Dentition: (Unable to evaluate) Self-Feeding Abilities: Needs assist Patient Positioning: Upright in bed Baseline Vocal Quality: Normal Volitional Cough: Weak    Oral/Motor/Sensory Function Overall Oral Motor/Sensory Function:  (Unable to evaluate - pt refused )   Ice Chips Ice chips: Not tested   Thin Liquid Thin Liquid: Impaired Presentation: Straw;Spoon Pharyngeal  Phase Impairments: Cough - Immediate;Suspected delayed Swallow    Nectar Thick Nectar Thick Liquid: Within functional limits Presentation: Straw   Honey Thick Honey Thick Liquid: Not tested   Puree Puree: Within functional limits   Solid     Solid: Impaired Presentation: Spoon Oral Phase Impairments: Impaired mastication Oral Phase Functional Implications: Prolonged oral transit;Impaired mastication(Pt expectorated bolus from his oral cavity )     Colin Mulders., M.S., CCC-SLP Acute Rehabilitation Services Office: [phone]  [place] 02/27/2019,4:59 PM

## 2019-02-27 NOTE — Progress Notes (Signed)
  Echocardiogram 2D Echocardiogram has been performed.  Dave Harris 02/27/2019, 3:44 PM

## 2019-02-27 NOTE — Progress Notes (Signed)
PROGRESS NOTE    Dave Harris  [id number] DOB: Feb 19, 1929 DOA: 02/26/2019 PCP: Garwin Brothers, MD   Brief Narrative:  Dave Harris is Dave Harris 86 y.o. male with medical history significant of CVA, CAD, Atrial Fibrillation on Apixaban, Carotid artery stenosis, HTN, HLD, BPH and Depression, who initially presented from his ALF to [facility] ED with hypoxemic respiratory failure with initial oxygen sats of 60s.  Patient was placed on NRB, given Remdesivir, Dexamethasone as well as antibiotics prior to transfer.  Patient is very hard of hearing.  He is able to state he has had symptoms for about 1 week but otherwise does not answer questions well.  He repeatedly states he cannot hear.  He denies any pain.  He denies any fevers.  He denies diarrhea.  Assessment & Plan:   Active Problems:   Pneumonia due to COVID-19 virus  # Acute Hypoxemic Respiratory Failure # COVID pneumonia # Possible Superimposed Bacterial Pneumonia - Has been weaned from NRB to 4 L [place] - steroids, remdesivir - continue ceftriaxone/azithromycin given elevated procalcitonin - I/O, daily weights - IS, OOB, prone as able  COVID-19 Labs  Recent Labs    02/26/19 2341 02/27/19 0208  DDIMER 4.20* 3.94*  FERRITIN 119 126  LDH 335*  --   CRP 22.9* 25.0*    No results found for: SARSCOV2NAA  # CAD # CHF # Chronic Atrial Fibrillation - He has uptrending troponin, likely demand in setting of above - EKG with afib, q waves in V3-6. Will check echo.  He's asymptomatic from this standpoint, already on ASA, coreg, statin. - BNP elevated, appears euvolemic, continue home lasix - Continue eliquis  # Hx of CVA (05/2015) - L MCA, s/p L CEA - on apixaban, atorvastatin - exam limited so difficult to assess motor/sensory deficits, limited in communication as well which may be due to hearing impairment and acute illness - was seen by Dr. Erlinda Harris in 08/2016, no focal deficits reported, but documented hearing loss     # HTN - continue carvedilol  # GERD - continue PPI  # Depression - continue mirtazapine  # BPH - continue tamsulosin and finasteride  # Hard of Hearing: has Dave Harris significant difficulty with hearing.  Please use paper/pen or whiteboard to facilitate conversation/interactions when able.  DVT prophylaxis: eliquis Code Status: DNR Family Communication: daughter Disposition Plan: pending further improvement  Consultants:   none  Procedures:   none  Antimicrobials:  Anti-infectives (From admission, onward)   Start     Dose/Rate Route Frequency Ordered Stop   02/27/19 1300  cefTRIAXone (ROCEPHIN) 1 g in sodium chloride 0.9 % 100 mL IVPB     1 g 200 mL/hr over 30 Minutes Intravenous Every 24 hours 02/26/19 2226     02/27/19 1100  azithromycin (ZITHROMAX) 500 mg in sodium chloride 0.9 % 250 mL IVPB     500 mg 250 mL/hr over 60 Minutes Intravenous Every 24 hours 02/26/19 2226     02/27/19 1000  remdesivir 100 mg in sodium chloride 0.9 % 100 mL IVPB     100 mg 200 mL/hr over 30 Minutes Intravenous Daily 02/26/19 2031 03/03/19 0959     Subjective: No complaints Limited discussion due to hearing difficulties  Objective: Vitals:   02/27/19 0720 02/27/19 1150 02/27/19 1200 02/27/19 1227  BP: 131/74 (!) 97/48 106/72   Pulse:  80 77 66  Resp: (!) 22 (!) 22 (!) 26 (!) 23  Temp: 98.2 F (36.8 C) 97.7 F (36.5 C)  TempSrc: Oral Oral    SpO2:  97% 97% 98%  Weight:      Height:        Intake/Output Summary (Last 24 hours) at 02/27/2019 1238 Last data filed at 02/27/2019 1108 Gross per 24 hour  Intake 2590 ml  Output 550 ml  Net 2040 ml   Filed Weights   02/26/19 1935  Weight: 60.7 kg    Examination:  General exam: Appears calm and comfortable  Respiratory system: Clear to auscultation. Respiratory effort normal. Cardiovascular system: S1 & S2 heard, irreg irreg. Gastrointestinal system: Abdomen is nondistended, soft and nontender.  Central nervous system:  Hard of hearing, CN 2-12 grossly intact Extremities: no LEE Skin: No rashes, lesions or ulcers Psychiatry: Judgement and insight appear normal. Mood & affect appropriate.     Data Reviewed: I have personally reviewed following labs and imaging studies  CBC: Recent Labs  Lab 02/26/19 2341 02/27/19 0208  WBC 12.7* 13.7*  NEUTROABS 11.3* 12.5*  HGB 13.8 13.9  HCT 43.8 43.4  MCV 82.2 81.4  PLT 253 [id number]   Basic Metabolic Panel: Recent Labs  Lab 02/26/19 2341 02/27/19 0208  NA 137 140  K 3.8 3.9  CL 103 102  CO2 22 21*  GLUCOSE 98 76  BUN 39* 40*  CREATININE 1.20 1.21  CALCIUM 8.7* 9.0  MG  --  2.2  PHOS  --  3.2   GFR: Estimated Creatinine Clearance: 36 mL/min (by C-G formula based on SCr of 1.21 mg/dL). Liver Function Tests: Recent Labs  Lab 02/26/19 2341 02/27/19 0208  AST 37 37  ALT 23 24  ALKPHOS 87 99  BILITOT 1.1 1.0  PROT 7.0 7.2  ALBUMIN 2.8* 2.8*   No results for input(s): LIPASE, AMYLASE in the last 168 hours. No results for input(s): AMMONIA in the last 168 hours. Coagulation Profile: No results for input(s): INR, PROTIME in the last 168 hours. Cardiac Enzymes: No results for input(s): CKTOTAL, CKMB, CKMBINDEX, TROPONINI in the last 168 hours. BNP (last 3 results) No results for input(s): PROBNP in the last 8760 hours. HbA1C: No results for input(s): HGBA1C in the last 72 hours. CBG: Recent Labs  Lab 02/26/19 1943 02/27/19 0045 02/27/19 0423 02/27/19 0744 02/27/19 1150  GLUCAP 116* 94 101* 100* 157*   Lipid Profile: No results for input(s): CHOL, HDL, LDLCALC, TRIG, CHOLHDL, LDLDIRECT in the last 72 hours. Thyroid Function Tests: No results for input(s): TSH, T4TOTAL, FREET4, T3FREE, THYROIDAB in the last 72 hours. Anemia Panel: Recent Labs    02/26/19 2341 02/27/19 0208  FERRITIN 119 126   Sepsis Labs: Recent Labs  Lab 02/26/19 2341  PROCALCITON 4.16    No results found for this or any previous visit (from the past 240  hour(s)).       Radiology Studies: No results found.      Scheduled Meds: . apixaban  5 mg Oral BID  . aspirin  81 mg Oral Daily  . atorvastatin  40 mg Oral QHS  . carvedilol  6.25 mg Oral BID WC  . cholecalciferol  2,000 Units Oral Daily  . docusate sodium  100 mg Oral BID  . finasteride  5 mg Oral Daily  . fluticasone furoate-vilanterol  1 puff Inhalation Daily  . furosemide  20 mg Oral Daily  . guaiFENesin  600 mg Oral BID  . insulin aspart  0-15 Units Subcutaneous Q4H  . Ipratropium-Albuterol  1 puff Inhalation Q6H  . loratadine  10 mg Oral Daily  .  methylPREDNISolone (SOLU-MEDROL) injection  40 mg Intravenous Q12H  . mirtazapine  30 mg Oral QHS  . pantoprazole  40 mg Oral Daily  . tamsulosin  0.4 mg Oral QHS  . vitamin B-12  1,000 mcg Oral Daily   Continuous Infusions: . azithromycin 500 mg (02/27/19 1107)  . cefTRIAXone (ROCEPHIN)  IV 1 g (02/27/19 1227)  . remdesivir 100 mg in NS 100 mL 100 mg (02/27/19 0917)     LOS: 1 day    Time spent: over 56 min    Fayrene Helper, MD [facility] Pager AMION  If 7PM-7AM, please contact night-coverage [email or web address] Password [facility] 02/27/2019, 12:38 PM

## 2019-02-27 NOTE — Plan of Care (Signed)
Patient admitted withCovid PNA, The patient will be treated and will return to His SNF.

## 2019-02-27 NOTE — Progress Notes (Signed)
12 lead EKG done at bedside. Lateral done as well. Dr. Andria Frames notified that they have been done. Advised of results, copies placed in the chart.

## 2019-02-27 NOTE — Progress Notes (Signed)
This nurse placed  Call to daughter to update, no answer and not able to leave message.

## 2019-02-27 NOTE — Progress Notes (Signed)
Call received from the lab, troponin 113. This nurse sent communication to the doctor and advised.

## 2019-02-27 NOTE — Progress Notes (Signed)
Body audit completed on patient, buttocks red, mepilex placed, large discoloration noted to the left lower back, discoloration noted to both arms. And red area noted to right groin area.. bilat heels red and outer aspects are red.

## 2019-02-28 LAB — CBC WITH DIFFERENTIAL/PLATELET
Abs Immature Granulocytes: 0.12 10*3/uL — ABNORMAL HIGH (ref 0.00–0.07)
Basophils Absolute: 0 10*3/uL (ref 0.0–0.1)
Basophils Relative: 0 %
Eosinophils Absolute: 0 10*3/uL (ref 0.0–0.5)
Eosinophils Relative: 0 %
HCT: 39.1 % (ref 39.0–52.0)
Hemoglobin: 12.8 g/dL — ABNORMAL LOW (ref 13.0–17.0)
Immature Granulocytes: 1 %
Lymphocytes Relative: 3 %
Lymphs Abs: 0.5 10*3/uL — ABNORMAL LOW (ref 0.7–4.0)
MCH: 26.1 pg (ref 26.0–34.0)
MCHC: 32.7 g/dL (ref 30.0–36.0)
MCV: 79.6 fL — ABNORMAL LOW (ref 80.0–100.0)
Monocytes Absolute: 0.3 10*3/uL (ref 0.1–1.0)
Monocytes Relative: 2 %
Neutro Abs: 15.8 10*3/uL — ABNORMAL HIGH (ref 1.7–7.7)
Neutrophils Relative %: 94 %
Platelets: 233 10*3/uL (ref 150–400)
RBC: 4.91 MIL/uL (ref 4.22–5.81)
RDW: 16.6 % — ABNORMAL HIGH (ref 11.5–15.5)
WBC: 16.8 10*3/uL — ABNORMAL HIGH (ref 4.0–10.5)
nRBC: 0 % (ref 0.0–0.2)

## 2019-02-28 LAB — D-DIMER, QUANTITATIVE: D-Dimer, Quant: 5.75 ug/mL-FEU — ABNORMAL HIGH (ref 0.00–0.50)

## 2019-02-28 LAB — COMPREHENSIVE METABOLIC PANEL
ALT: 31 U/L (ref 0–44)
AST: 47 U/L — ABNORMAL HIGH (ref 15–41)
Albumin: 2.4 g/dL — ABNORMAL LOW (ref 3.5–5.0)
Alkaline Phosphatase: 120 U/L (ref 38–126)
Anion gap: 14 (ref 5–15)
BUN: 48 mg/dL — ABNORMAL HIGH (ref 8–23)
CO2: 21 mmol/L — ABNORMAL LOW (ref 22–32)
Calcium: 9.1 mg/dL (ref 8.9–10.3)
Chloride: 103 mmol/L (ref 98–111)
Creatinine, Ser: 1.14 mg/dL (ref 0.61–1.24)
GFR calc Af Amer: >60 mL/min (ref >60)
GFR calc non Af Amer: 58 mL/min — ABNORMAL LOW (ref >60)
Glucose, Bld: 135 mg/dL — ABNORMAL HIGH (ref 70–99)
Potassium: 3.8 mmol/L (ref 3.5–5.1)
Sodium: 138 mmol/L (ref 135–145)
Total Bilirubin: 0.6 mg/dL (ref 0.3–1.2)
Total Protein: 6.7 g/dL (ref 6.5–8.1)

## 2019-02-28 LAB — TROPONIN I (HIGH SENSITIVITY): Troponin I (High Sensitivity): 112 ng/L (ref <18)

## 2019-02-28 LAB — GLUCOSE, CAPILLARY
Glucose-Capillary: 119 mg/dL — ABNORMAL HIGH (ref 70–99)
Glucose-Capillary: 131 mg/dL — ABNORMAL HIGH (ref 70–99)
Glucose-Capillary: 139 mg/dL — ABNORMAL HIGH (ref 70–99)
Glucose-Capillary: 147 mg/dL — ABNORMAL HIGH (ref 70–99)
Glucose-Capillary: 157 mg/dL — ABNORMAL HIGH (ref 70–99)

## 2019-02-28 LAB — MAGNESIUM: Magnesium: 2.3 mg/dL (ref 1.7–2.4)

## 2019-02-28 LAB — PROCALCITONIN: Procalcitonin: 3.22 ng/mL

## 2019-02-28 LAB — C-REACTIVE PROTEIN: CRP: 26.1 mg/dL — ABNORMAL HIGH (ref <1.0)

## 2019-02-28 LAB — FERRITIN: Ferritin: 155 ng/mL (ref 24–336)

## 2019-02-28 LAB — PHOSPHORUS: Phosphorus: 3.1 mg/dL (ref 2.5–4.6)

## 2019-02-28 MED ORDER — DEXAMETHASONE 6 MG PO TABS
6.0000 mg | ORAL_TABLET | Freq: Every day | ORAL | Status: DC
  Administered 2019-02-28 – 2019-03-01 (×2): 6 mg via ORAL
  Filled 2019-02-28 (×2): qty 1

## 2019-02-28 NOTE — TOC Initial Note (Signed)
Transition of Care [facility]) - Initial/Assessment Note    Patient Details  Name: Dave Harris MRN: [id number] Date of Birth: 1929-11-23  Transition of Care (TOC) CM/SW Contact:    Joaquin Courts, RN Phone Number: 02/28/2019, 3:09 PM  Clinical Narrative:                 Patient is a resident at [facility].  Cm spoke with facility rep who states patient can return at dc. Patient will need updated FL2 with medications faxed to facility at [phone].  Should patient need O2 at time of dc, facility requests that oxygen set up be delivered to facility prior to patient's arrival (patient does not have any O2 available at the facility at this time).    Expected Discharge Plan: Assisted Living Barriers to Discharge: Continued Medical Work up   Patient Goals and CMS Choice Patient states their goals for this hospitalization and ongoing recovery are:: to return to assisted living facility      Expected Discharge Plan and Services Expected Discharge Plan: Assisted Living   Discharge Planning Services: CM Consult Post Acute Care Choice: Resumption of Svcs/PTA Provider Living arrangements for the past 2 months: Assisted Living Facility                 DME Arranged: N/A DME Agency: NA       HH Arranged: NA [place] Agency: NA        Prior Living Arrangements/Services Living arrangements for the past 2 months: [place] Lives with:: Facility Resident Patient language and need for interpreter reviewed:: Yes Do you feel safe going back to the place where you live?: Yes      Need for Family Participation in Patient Care: Yes (Comment) Care giver support system in place?: Yes (comment)   Criminal Activity/Legal Involvement Pertinent to Current Situation/Hospitalization: No - Comment as needed  Activities of Daily Living Home Assistive Devices/Equipment: Blood pressure cuff, CBG Meter, Oxygen ADL Screening (condition at time of  admission) Patient's cognitive ability adequate to safely complete daily activities?: No Is the patient deaf or have difficulty hearing?: Yes Does the patient have difficulty seeing, even when wearing glasses/contacts?: No Does the patient have difficulty concentrating, remembering, or making decisions?: Yes Patient able to express need for assistance with ADLs?: Yes Does the patient have difficulty dressing or bathing?: Yes Independently performs ADLs?: No Communication: Independent, Needs assistance Is this a change from baseline?: Pre-admission baseline Dressing (OT): Needs assistance Is this a change from baseline?: Change from baseline, expected to last >3 days Grooming: Needs assistance Is this a change from baseline?: Change from baseline, expected to last >3 days Feeding: Independent Bathing: Needs assistance Is this a change from baseline?: Change from baseline, expected to last >3 days Toileting: Needs assistance Is this a change from baseline?: Change from baseline, expected to last >3days In/Out Bed: Needs assistance Is this a change from baseline?: Change from baseline, expected to last >3 days Walks in Home: Needs assistance Is this a change from baseline?: Change from baseline, expected to last >3 days Does the patient have difficulty walking or climbing stairs?: Yes Weakness of Legs: Both Weakness of Arms/Hands: Both  Permission Sought/Granted   Permission granted to share information with : Yes, Verbal Permission Granted     Permission granted to share info w [facility]ENC[facility]        Emotional Assessment           Psych  Involvement: No (comment)  Admission diagnosis:  Pneumonia due to COVID-19 virus [U07.1, J12.82] Patient Active Problem List   Diagnosis Date Noted  . Pneumonia due to COVID-19 virus 02/26/2019  . Hx of CABG 11/05/2017  . Bilateral carotid artery stenosis 09/03/2016  . Grief reaction 12/11/2015  . Cerebrovascular  accident (CVA) due to embolism of left middle cerebral artery ([place]) 08/22/2015  . Chronic anticoagulation 08/22/2015  . S/P carotid endarterectomy 06/19/2015  . Carotid stenosis   . HLD (hyperlipidemia)   . CVA (cerebral infarction) 05/22/2015  . Chronic atrial fibrillation ([place])   . Myocardial infarction ([place])   . Allergy   . Stroke ([place])   . Hypertensive heart disease with heart failure ([place]) 07/21/2014  . Pure hypercholesterolemia 07/21/2014  . Coronary artery disease involving native coronary artery of native heart with angina pectoris ([place]) 07/21/2014  . Bruit 06/10/2012  . Hyperlipidemia   . Heart disease    PCP:  Garwin Brothers, MD Pharmacy:   [place], [place] [address] AT [place] and [place] [place] [address] Phone: [phone] Fax: [phone]  [place], [place] [address] Phone: [phone] Fax: [phone]     Social Determinants of Health (SDOH) Interventions    Readmission Risk Interventions No flowsheet data found.

## 2019-02-28 NOTE — Plan of Care (Signed)
The patient is progressing. The patient has been down from 15L HF to 2 L via [place].

## 2019-02-28 NOTE — Progress Notes (Signed)
Attempted to contact daughter for update, no answer and no voicemail available. The patient requires max assist, condom cath in place, draining dark amber colored urine. The patient denies pain. The patient has been turned and repositioned. The patient has been weaned down to 2 liters of 02 and is doing well. Some congestion noted. HOB is elevated for comfort.

## 2019-02-28 NOTE — Plan of Care (Signed)
  Problem: Education: Goal: Knowledge of General Education information will improve Description: Including pain rating scale, medication(s)/side effects and non-pharmacologic comfort measures Outcome: Not Met (Pt is hard of hearing. Communicating by writing things down. Pt is not real eager to learn)   Problem: Health Behavior/Discharge Planning: Goal: Ability to manage health-related needs will improve Outcome: Not Progressing   Problem: Clinical Measurements: Goal: Ability to maintain clinical measurements within normal limits will improve Outcome: Progressing Goal: Will remain free from infection Outcome: Progressing Goal: Diagnostic test results will improve Outcome: Progressing Goal: Respiratory complications will improve Outcome: Progressing Goal: Cardiovascular complication will be avoided Outcome: Not Met (add Reason)   Problem: Activity: Goal: Risk for activity intolerance will decrease Outcome: Not Met (Pt does not move much in bed.)   Problem: Nutrition: Goal: Adequate nutrition will be maintained Outcome: Not Met (Pt is on a nectar thick diet and refuses to eat.)   Problem: Coping: Goal: Level of anxiety will decrease Outcome: Not Applicable   Problem: Elimination: Goal: Will not experience complications related to bowel motility Outcome: Not Progressing   Problem: Pain Managment: Goal: General experience of comfort will improve Outcome: Progressing   Problem: Safety: Goal: Ability to remain free from injury will improve Outcome: Progressing

## 2019-02-28 NOTE — Progress Notes (Addendum)
PROGRESS NOTE    Dave Harris  [id number] DOB: 03/15/1929 DOA: 02/26/2019 PCP: Garwin Brothers, MD   Brief Narrative:  Dave Harris is Dave Harris 86 y.o. male with medical history significant of CVA, CAD, Atrial Fibrillation on Apixaban, Carotid artery stenosis, HTN, HLD, BPH and Depression, who initially presented from his ALF to [facility] ED with hypoxemic respiratory failure with initial oxygen sats of 60s.  Patient was placed on NRB, given Remdesivir, Dexamethasone as well as antibiotics prior to transfer.  Patient is very hard of hearing.  He is able to state he has had symptoms for about 1 week but otherwise does not answer questions well.  He repeatedly states he cannot hear.  He denies any pain.  He denies any fevers.  He denies diarrhea.  Assessment & Plan:   Active Problems:   Pneumonia due to COVID-19 virus  # Acute Hypoxemic Respiratory Failure # COVID pneumonia # Possible Superimposed Bacterial Pneumonia - Currently on RA - steroids, remdesivir - continue ceftriaxone/azithromycin given elevated procalcitonin - I/O, daily weights - IS, OOB, prone as able  COVID-19 Labs  Recent Labs    02/26/19 2341 02/27/19 0208 02/28/19 0146  DDIMER 4.20* 3.94* 5.75*  FERRITIN 119 126 155  LDH 335*  --   --   CRP 22.9* 25.0* 26.1*    No results found for: SARSCOV2NAA  # CAD # CHF # Chronic Atrial Fibrillation - He has uptrending troponin, likely demand in setting of above - EKG with afib, q waves in V3-6. He's asymptomatic from this standpoint, already on ASA, coreg, statin. - echo is notable for regional wall motion abnormalities (these appear to be new from previous echos or not previously commented on) - troponin mildly uptrending - repeat today - will need outpatient cardiology follow up, discussed with cardiology briefly given apparent new WMA above - BNP elevated, appears euvolemic, continue home lasix - Continue eliquis  # Nonsustained Vtach -  echo as above, continue beta blocker  # Hx of CVA (05/2015) - L MCA, s/p L CEA - on apixaban, atorvastatin - exam limited so difficult to assess motor/sensory deficits, limited in communication as well which may be due to hearing impairment and acute illness - was seen by Dr. Erlinda Hong in 08/2016, no focal deficits reported, but documented hearing loss   # HTN - continue carvedilol  # GERD - continue PPI  # Depression - continue mirtazapine  # BPH - continue tamsulosin and finasteride  # Hard of Hearing: has Cherye Gaertner significant difficulty with hearing.  Please use paper/pen or whiteboard to facilitate conversation/interactions when able.  # Stress Hyperglycemia: follow A1c, SSI  # dysphagia: modified diet per speech  DVT prophylaxis: eliquis Code Status: DNR Family Communication: daughter Disposition Plan: pending further improvement  Consultants:   none  Procedures:   none  Antimicrobials:  Anti-infectives (From admission, onward)   Start     Dose/Rate Route Frequency Ordered Stop   02/27/19 1300  cefTRIAXone (ROCEPHIN) 1 g in sodium chloride 0.9 % 100 mL IVPB     1 g 200 mL/hr over 30 Minutes Intravenous Every 24 hours 02/26/19 2226     02/27/19 1100  azithromycin (ZITHROMAX) 500 mg in sodium chloride 0.9 % 250 mL IVPB     500 mg 250 mL/hr over 60 Minutes Intravenous Every 24 hours 02/26/19 2226     02/27/19 1000  remdesivir 100 mg in sodium chloride 0.9 % 100 mL IVPB     100 mg 200 mL/hr over  30 Minutes Intravenous Daily 02/26/19 2031 03/03/19 0959     Subjective: C/o being cold Does not express any pain or discomfort when asked via paper/pen  Objective: Vitals:   02/27/19 2000 02/28/19 0000 02/28/19 0400 02/28/19 0709  BP: 125/73 (!) 101/55  117/82  Pulse:    72  Resp:    (!) 21  Temp: 97.9 F (36.6 C) 97.8 F (36.6 C) 97.7 F (36.5 C) 97.7 F (36.5 C)  TempSrc: Axillary Axillary  Oral  SpO2:    90%  Weight:      Height:        Intake/Output Summary  (Last 24 hours) at 02/28/2019 1049 Last data filed at 02/28/2019 0900 Gross per 24 hour  Intake 910 ml  Output 725 ml  Net 185 ml   Filed Weights   02/26/19 1935  Weight: 60.7 kg    Examination:  General: No acute distress. Cardiovascular: Heart sounds show Kashlynn Kundert regular rate, and rhythm.  Lungs: Clear to auscultation bilaterally Abdomen: Soft, nontender, nondistended Neurological: Alert.  Hard of hearing. Moves all extremities 4. Cranial nerves II through XII grossly intact. Skin: Warm and dry. No rashes or lesions. Extremities: No clubbing or cyanosis. No edema  Data Reviewed: I have personally reviewed following labs and imaging studies  CBC: Recent Labs  Lab 02/26/19 2341 02/27/19 0208 02/28/19 0146  WBC 12.7* 13.7* 16.8*  NEUTROABS 11.3* 12.5* 15.8*  HGB 13.8 13.9 12.8*  HCT 43.8 43.4 39.1  MCV 82.2 81.4 79.6*  PLT 253 269 0000000   Basic Metabolic Panel: Recent Labs  Lab 02/26/19 2341 02/27/19 0208 02/28/19 0146  NA 137 140 138  K 3.8 3.9 3.8  CL 103 102 103  CO2 22 21* 21*  GLUCOSE 98 76 135*  BUN 39* 40* 48*  CREATININE 1.20 1.21 1.14  CALCIUM 8.7* 9.0 9.1  MG  --  2.2 2.3  PHOS  --  3.2 3.1   GFR: Estimated Creatinine Clearance: 38.2 mL/min (by C-G formula based on SCr of 1.14 mg/dL). Liver Function Tests: Recent Labs  Lab 02/26/19 2341 02/27/19 0208 02/28/19 0146  AST 37 37 47*  ALT 23 24 31   ALKPHOS 87 99 120  BILITOT 1.1 1.0 0.6  PROT 7.0 7.2 6.7  ALBUMIN 2.8* 2.8* 2.4*   No results for input(s): LIPASE, AMYLASE in the last 168 hours. No results for input(s): AMMONIA in the last 168 hours. Coagulation Profile: No results for input(s): INR, PROTIME in the last 168 hours. Cardiac Enzymes: No results for input(s): CKTOTAL, CKMB, CKMBINDEX, TROPONINI in the last 168 hours. BNP (last 3 results) No results for input(s): PROBNP in the last 8760 hours. HbA1C: No results for input(s): HGBA1C in the last 72 hours. CBG: Recent Labs  Lab  02/27/19 1556 02/27/19 1951 02/28/19 0016 02/28/19 0421 02/28/19 0742  GLUCAP 200* 144* 131* 147* 119*   Lipid Profile: No results for input(s): CHOL, HDL, LDLCALC, TRIG, CHOLHDL, LDLDIRECT in the last 72 hours. Thyroid Function Tests: No results for input(s): TSH, T4TOTAL, FREET4, T3FREE, THYROIDAB in the last 72 hours. Anemia Panel: Recent Labs    02/27/19 0208 02/28/19 0146  FERRITIN 126 155   Sepsis Labs: Recent Labs  Lab 02/26/19 2341 02/28/19 0146  PROCALCITON 4.16 3.22    No results found for this or any previous visit (from the past 240 hour(s)).       Radiology Studies: ECHOCARDIOGRAM COMPLETE  Result Date: 02/27/2019   ECHOCARDIOGRAM REPORT   Patient Name:   Dave Harris  Gutter Date of Exam: 02/27/2019 Medical Rec #:  [id number]            Height:       64.0 in Accession #:    [id number]           Weight:       133.8 lb Date of Birth:  [birth year]            BSA:          1.65 m Patient Age:    22 years             BP:           106/72 mmHg Patient Gender: M                    HR:           79 bpm. Exam Location:  Inpatient Procedure: 2D Echo Indications:    elevated troponin  History:        Patient has prior history of Echocardiogram examinations, most                 recent 06/16/2012. CAD, Prior CABG, Covid pneumonia,                 Arrythmias:Atrial Fibrillation; Risk Factors:Dyslipidemia.  Sonographer:    Johny Chess Referring Phys: [phone] Penina Reisner CALDWELL POWELL [place]  1. Left ventricular ejection fraction, by visual estimation, is 50 to 55%. The left ventricle has normal function. There is no left ventricular hypertrophy.  2. The left ventricle demonstrates regional wall motion abnormalities.  3. Inferior basal hypokinesis.  4. Global right ventricle has normal systolic function.The right ventricular size is normal. No increase in right ventricular wall thickness.  5. Left atrial size was moderately dilated.  6. Right atrial size was moderately dilated.  7.  The mitral valve is normal in structure. Mild mitral valve regurgitation. No evidence of mitral stenosis.  8. The tricuspid valve is normal in structure.  9. The aortic valve is tricuspid. Aortic valve regurgitation is not visualized. Mild aortic valve sclerosis without stenosis. 10. Pulmonic regurgitation is moderate. 11. The pulmonic valve was not well visualized. Pulmonic valve regurgitation is moderate. 12. Moderately elevated pulmonary artery systolic pressure. 13. The inferior vena cava is normal in size with greater than 50% respiratory variability, suggesting right atrial pressure of 3 mmHg. FINDINGS  Left Ventricle: Left ventricular ejection fraction, by visual estimation, is 50 to 55%. The left ventricle has normal function. The left ventricle demonstrates regional wall motion abnormalities. There is no left ventricular hypertrophy. Normal left atrial pressure. Inferior basal hypokinesis. Right Ventricle: The right ventricular size is normal. No increase in right ventricular wall thickness. Global RV systolic function is has normal systolic function. The tricuspid regurgitant velocity is 3.04 m/s, and with an assumed right atrial pressure  of 3 mmHg, the estimated right ventricular systolic pressure is moderately elevated at 40.0 mmHg. Left Atrium: Left atrial size was moderately dilated. Right Atrium: Right atrial size was moderately dilated Pericardium: There is no evidence of pericardial effusion. Mitral Valve: The mitral valve is normal in structure. There is mild thickening of the mitral valve leaflet(s). Mild mitral valve regurgitation. No evidence of mitral valve stenosis by observation. Tricuspid Valve: The tricuspid valve is normal in structure. Tricuspid valve regurgitation is not demonstrated. Aortic Valve: The aortic valve is tricuspid. Aortic valve regurgitation is not visualized. Mild aortic valve sclerosis is present, with no evidence of aortic valve stenosis.  Pulmonic Valve: The pulmonic  valve was not well visualized. Pulmonic valve regurgitation is moderate. Pulmonic regurgitation is moderate. Aorta: The aortic root, ascending aorta and aortic arch are all structurally normal, with no evidence of dilitation or obstruction. Venous: The inferior vena cava is normal in size with greater than 50% respiratory variability, suggesting right atrial pressure of 3 mmHg. IAS/Shunts: No atrial level shunt detected by color flow Doppler. There is no evidence of Gatsby Chismar patent foramen ovale. No ventricular septal defect is seen or detected. There is no evidence of an atrial septal defect.  LEFT VENTRICLE PLAX 2D LVIDd:         4.30 cm LVIDs:         3.31 cm LV PW:         1.20 cm LV IVS:        0.99 cm LVOT diam:     1.90 cm LV SV:         39 ml LV SV Index:   23.24 LVOT Area:     2.84 cm  RIGHT VENTRICLE TAPSE (M-mode): 1.2 cm LEFT ATRIUM             Index       RIGHT ATRIUM           Index LA diam:        4.40 cm 2.67 cm/m  RA Area:     24.80 cm LA Vol (A2C):   84.5 ml 51.24 ml/m RA Volume:   81.80 ml  49.60 ml/m LA Vol (A4C):   79.7 ml 48.33 ml/m LA Biplane Vol: 84.1 ml 50.99 ml/m   AORTA Ao Root diam: 3.30 cm TRICUSPID VALVE TR Peak grad:   37.0 mmHg TR Vmax:        338.00 cm/s  SHUNTS Systemic Diam: 1.90 cm  Jenkins Rouge MD Electronically signed by Jenkins Rouge MD Signature Date/Time: 02/27/2019/3:48:13 PM    Final         Scheduled Meds: . apixaban  5 mg Oral BID  . aspirin  81 mg Oral Daily  . atorvastatin  40 mg Oral QHS  . carvedilol  6.25 mg Oral BID WC  . cholecalciferol  2,000 Units Oral Daily  . dexamethasone  6 mg Oral Daily  . docusate sodium  100 mg Oral BID  . finasteride  5 mg Oral Daily  . fluticasone furoate-vilanterol  1 puff Inhalation Daily  . furosemide  20 mg Oral Daily  . guaiFENesin  600 mg Oral BID  . insulin aspart  0-15 Units Subcutaneous Q4H  . Ipratropium-Albuterol  1 puff Inhalation Q6H  . loratadine  10 mg Oral Daily  . mirtazapine  30 mg Oral QHS  .  pantoprazole  40 mg Oral Daily  . tamsulosin  0.4 mg Oral QHS  . vitamin B-12  1,000 mcg Oral Daily   Continuous Infusions: . azithromycin 500 mg (02/28/19 1012)  . cefTRIAXone (ROCEPHIN)  IV 1 g (02/27/19 1227)  . remdesivir 100 mg in NS 100 mL 100 mg (02/28/19 0805)     LOS: 2 days    Time spent: over 76 min    Fayrene Helper, MD [facility] Pager AMION  If 7PM-7AM, please contact night-coverage [email or web address] Password [facility] 02/28/2019, 10:49 AM

## 2019-03-01 ENCOUNTER — Encounter (HOSPITAL_COMMUNITY): Payer: Self-pay | Admitting: Internal Medicine

## 2019-03-01 ENCOUNTER — Inpatient Hospital Stay (HOSPITAL_COMMUNITY): Payer: PPO

## 2019-03-01 DIAGNOSIS — I6389 Other cerebral infarction: Secondary | ICD-10-CM

## 2019-03-01 DIAGNOSIS — R0902 Hypoxemia: Secondary | ICD-10-CM

## 2019-03-01 LAB — CBC WITH DIFFERENTIAL/PLATELET
Abs Immature Granulocytes: 0.21 10*3/uL — ABNORMAL HIGH (ref 0.00–0.07)
Basophils Absolute: 0.1 10*3/uL (ref 0.0–0.1)
Basophils Relative: 0 %
Eosinophils Absolute: 0 10*3/uL (ref 0.0–0.5)
Eosinophils Relative: 0 %
HCT: 42.1 % (ref 39.0–52.0)
Hemoglobin: 13.4 g/dL (ref 13.0–17.0)
Immature Granulocytes: 1 %
Lymphocytes Relative: 4 %
Lymphs Abs: 0.7 10*3/uL (ref 0.7–4.0)
MCH: 25.8 pg — ABNORMAL LOW (ref 26.0–34.0)
MCHC: 31.8 g/dL (ref 30.0–36.0)
MCV: 81.1 fL (ref 80.0–100.0)
Monocytes Absolute: 0.6 10*3/uL (ref 0.1–1.0)
Monocytes Relative: 3 %
Neutro Abs: 17.9 10*3/uL — ABNORMAL HIGH (ref 1.7–7.7)
Neutrophils Relative %: 92 %
Platelets: 285 10*3/uL (ref 150–400)
RBC: 5.19 MIL/uL (ref 4.22–5.81)
RDW: 17 % — ABNORMAL HIGH (ref 11.5–15.5)
WBC: 19.5 10*3/uL — ABNORMAL HIGH (ref 4.0–10.5)
nRBC: 0 % (ref 0.0–0.2)

## 2019-03-01 LAB — COMPREHENSIVE METABOLIC PANEL
ALT: 58 U/L — ABNORMAL HIGH (ref 0–44)
AST: 75 U/L — ABNORMAL HIGH (ref 15–41)
Albumin: 2.4 g/dL — ABNORMAL LOW (ref 3.5–5.0)
Alkaline Phosphatase: 121 U/L (ref 38–126)
Anion gap: 13 (ref 5–15)
BUN: 53 mg/dL — ABNORMAL HIGH (ref 8–23)
CO2: 22 mmol/L (ref 22–32)
Calcium: 9.2 mg/dL (ref 8.9–10.3)
Chloride: 107 mmol/L (ref 98–111)
Creatinine, Ser: 1.11 mg/dL (ref 0.61–1.24)
GFR calc Af Amer: >60 mL/min (ref >60)
GFR calc non Af Amer: 59 mL/min — ABNORMAL LOW (ref >60)
Glucose, Bld: 112 mg/dL — ABNORMAL HIGH (ref 70–99)
Potassium: 3.8 mmol/L (ref 3.5–5.1)
Sodium: 142 mmol/L (ref 135–145)
Total Bilirubin: 0.8 mg/dL (ref 0.3–1.2)
Total Protein: 6.8 g/dL (ref 6.5–8.1)

## 2019-03-01 LAB — PROTIME-INR
INR: 2.7 — ABNORMAL HIGH (ref 0.8–1.2)
Prothrombin Time: 28.7 seconds — ABNORMAL HIGH (ref 11.4–15.2)

## 2019-03-01 LAB — MAGNESIUM: Magnesium: 2.3 mg/dL (ref 1.7–2.4)

## 2019-03-01 LAB — FERRITIN: Ferritin: 172 ng/mL (ref 24–336)

## 2019-03-01 LAB — HEMOGLOBIN A1C
Hgb A1c MFr Bld: 6.4 % — ABNORMAL HIGH (ref 4.8–5.6)
Mean Plasma Glucose: 136.98 mg/dL

## 2019-03-01 LAB — C-REACTIVE PROTEIN: CRP: 16.6 mg/dL — ABNORMAL HIGH (ref <1.0)

## 2019-03-01 LAB — GLUCOSE, CAPILLARY
Glucose-Capillary: 130 mg/dL — ABNORMAL HIGH (ref 70–99)
Glucose-Capillary: 119 mg/dL — ABNORMAL HIGH (ref 70–99)
Glucose-Capillary: 141 mg/dL — ABNORMAL HIGH (ref 70–99)
Glucose-Capillary: 112 mg/dL — ABNORMAL HIGH (ref 70–99)
Glucose-Capillary: 112 mg/dL — ABNORMAL HIGH (ref 70–99)
Glucose-Capillary: 139 mg/dL — ABNORMAL HIGH (ref 70–99)
Glucose-Capillary: 149 mg/dL — ABNORMAL HIGH (ref 70–99)

## 2019-03-01 LAB — APTT: aPTT: 41 seconds — ABNORMAL HIGH (ref 24–36)

## 2019-03-01 LAB — PROCALCITONIN: Procalcitonin: 1.69 ng/mL

## 2019-03-01 LAB — PHOSPHORUS: Phosphorus: 3.5 mg/dL (ref 2.5–4.6)

## 2019-03-01 LAB — D-DIMER, QUANTITATIVE: D-Dimer, Quant: 11.24 ug/mL-FEU — ABNORMAL HIGH (ref 0.00–0.50)

## 2019-03-01 MED ORDER — LACTATED RINGERS IV BOLUS
500.0000 mL | Freq: Once | INTRAVENOUS | Status: AC
  Administered 2019-03-01: 12:00:00 500 mL via INTRAVENOUS

## 2019-03-01 MED ORDER — IOHEXOL 350 MG/ML SOLN
75.0000 mL | Freq: Once | INTRAVENOUS | Status: AC | PRN
  Administered 2019-03-01: 11:00:00 75 mL via INTRAVENOUS

## 2019-03-01 MED ORDER — HEPARIN SODIUM (PORCINE) 5000 UNIT/ML IJ SOLN
5000.0000 [IU] | Freq: Three times a day (TID) | INTRAMUSCULAR | Status: DC
  Administered 2019-03-02 (×3): 5000 [IU] via SUBCUTANEOUS
  Filled 2019-03-01 (×3): qty 1

## 2019-03-01 MED ORDER — METHYLPREDNISOLONE SODIUM SUCC 40 MG IJ SOLR
40.0000 mg | Freq: Two times a day (BID) | INTRAMUSCULAR | Status: DC
  Administered 2019-03-01 – 2019-03-02 (×3): 40 mg via INTRAVENOUS
  Filled 2019-03-01 (×3): qty 1

## 2019-03-01 MED ORDER — STROKE: EARLY STAGES OF RECOVERY BOOK
Freq: Once | Status: AC
  Filled 2019-03-01: qty 1

## 2019-03-01 NOTE — Progress Notes (Signed)
0745 pt desating to 84% on 2L [place]. turned to 6L with no recoving. plcaed on high flow 10L, sat 90%. 0830 noted to not be moving left arm, but following commands with right side, unaware if this was pts baseline. Pt was still able to say "water" and take pills crushed in applesause at 0900.  0930 Dr Florene Glen at bedside, assessed and called family to establish what pts baseline is 1015 Ordered to call a code stroke. See rapid response code stroke note

## 2019-03-01 NOTE — Progress Notes (Addendum)
PROGRESS NOTE    Dave Harris  [id number] DOB: 27-Feb-1929 DOA: 02/26/2019 PCP: Garwin Brothers, MD   Brief Narrative:  Dave Harris is Dave Harris 86 y.o. male with medical history significant of CVA, CAD, Atrial Fibrillation on Apixaban, Carotid artery stenosis, HTN, HLD, BPH and Depression, who initially presented from his ALF to [facility] ED with hypoxemic respiratory failure with initial oxygen sats of 60s.  Patient was placed on NRB, given Remdesivir, Dexamethasone as well as antibiotics prior to transfer.  He has significant hearing impairment which limits communication.   He'd been improving from Dave Harris respiratory status and was on RA on 1/18, but on 1/19 he was noted to have L sided weakness and neglect concerning for stroke as well as worsening encephalopathy and hypoxia.  Code stroke was called, neurology now following.  If he worsening from neurologic standpoint or respiratory standpoint, will need to discuss goc additionally with daughter.    Assessment & Plan:   Active Problems:   Pneumonia due to COVID-19 virus  # L sided weakness, Neglect  Acute Cortical/Subcortical Infarct within Anterior R Frontal Lobe:  1/19 AM - Nursing noted pt not using L arm on her evaluation, on my exam, pt with flaccid LUE, L sided neglect, did withdraw from pain to LUE.  Code stroke called.  Unclear when his last normal was, nursing noted this exam finding around 8 am, but his exam has been previously limited by communication and his significant hearing impairment.   - CT head notable for acute cortical/subcortical infarct within the anterior R frontal lobe - CTA with occlusion of R ICA at the bifurcation and through the skull base, reonstituation in the distal R siphon and supraclinoid region presumably from external to internal collaterals... small filling defect in the R supraclinoid ICA.  No definable intracranial anterior circulation large or medium vessel occlusion.  Presumed embolic  occlusion of smaller R frontal MCA branch.  Atherosclerotic disease in the L carotid siphon with stenosis estimated at 30%.  Atherosclerotic disease at both vertebral artery origins with stenosis 30-50% on each side. - continue ASA daily.  Hold eliquis.  - continue statin - A1c, lipid panel  - PT/OT/SLP - npo until speech eval - echo done 1/17, EF 50-55%, regional WMA, inferior basal hypokinesis - see report - frequent neuro checks - follow repeat CT head in AM - follow with IVF bolus, caution in setting of below - Hold carvedilol, permissive hypertension - appreciate neurology recommendations, I agree with holding off on acute stroke intervention at this point, especially in setting of his worsening AHRF below  # Acute Hypoxemic Respiratory Failure # COVID pneumonia # Possible Superimposed Bacterial Pneumonia - Worsening hypoxia this AM, requiring 10 L  - steroids, remdesivir - continue ceftriaxone/azithromycin given elevated procalcitonin - will hold off on actemra given elevated procalcitonin at presentation (remains elevated today) - I/O, daily weights - IS, OOB, prone as able - CXR 1/19 with worsening aeration with progressive bilateral infiltrates  COVID-19 Labs  Recent Labs    02/26/19 2341 02/26/19 2341 02/27/19 0208 02/28/19 0146 03/01/19 0524  DDIMER 4.20*   < > 3.94* 5.75* 11.24*  FERRITIN 119   < > 126 155 172  LDH 335*  --   --   --   --   CRP 22.9*   < > 25.0* 26.1* 16.6*   < > = values in this interval not displayed.    No results found for: SARSCOV2NAA  # CAD # CHF # Chronic  Atrial Fibrillation - He has uptrending troponin, likely demand in setting of above - EKG with afib, q waves in V3-6. He's asymptomatic from this standpoint, already on ASA, coreg, statin. - echo is notable for regional wall motion abnormalities (these appear to be new from previous echos or not previously commented on) - troponin mildly uptrending, peaked at 137 - will need  outpatient cardiology follow up, discussed with cardiology briefly given apparent new WMA above - BNP elevated, appears euvolemic, continue home lasix - Continue eliquis (on hold given CVA today)  # Acute Metabolic Encephalopathy: 2/2 COVID infection and CVA and hearing impairment.  Delirium precautions.   # Nonsustained Vtach - echo as above, beta blocker on hold  # Hx of CVA (05/2015) - L MCA, s/p L CEA - on apixaban, atorvastatin - exam limited so difficult to assess motor/sensory deficits, limited in communication as well which may be due to hearing impairment and acute illness - was seen by Dr. Erlinda Harris in 08/2016, no focal deficits reported, but documented hearing loss  - see above  # HTN - continue carvedilol (hold)  # GERD - continue PPI  # Depression - continue mirtazapine  # BPH - continue tamsulosin and finasteride  # Hard of Hearing: has Dave Harris significant difficulty with hearing.  Please use paper/pen or whiteboard to facilitate conversation/interactions when able (only mild success with this, he's never written anything for himself, sometimes will read what we write).  # Stress Hyperglycemia: follow A1c (6.4), SSI  # dysphagia: modified diet per speech  DVT prophylaxis: eliquis on hold due to above, start heparin ppx per pharmacy Code Status: DNR Family Communication: daughter Disposition Plan: pending further improvement  Consultants:   none  Procedures:   none  Antimicrobials:  Anti-infectives (From admission, onward)   Start     Dose/Rate Route Frequency Ordered Stop   02/27/19 1300  cefTRIAXone (ROCEPHIN) 1 g in sodium chloride 0.9 % 100 mL IVPB     1 g 200 mL/hr over 30 Minutes Intravenous Every 24 hours 02/26/19 2226     02/27/19 1100  azithromycin (ZITHROMAX) 500 mg in sodium chloride 0.9 % 250 mL IVPB     500 mg 250 mL/hr over 60 Minutes Intravenous Every 24 hours 02/26/19 2226     02/27/19 1000  remdesivir 100 mg in sodium chloride 0.9 % 100 mL  IVPB     100 mg 200 mL/hr over 30 Minutes Intravenous Daily 02/26/19 2031 03/03/19 0959     Subjective: Unintelligible speech, quiet  Objective: Vitals:   03/01/19 1315 03/01/19 1330 03/01/19 1345 03/01/19 1400  BP:  (!) 103/57  (!) 116/59  Pulse: 61 62 65 (!) 54  Resp: (!) 25 (!) 24 (!) 25 20  Temp:      TempSrc:      SpO2: 99% 92% 93% 93%  Weight:      Height:        Intake/Output Summary (Last 24 hours) at 03/01/2019 1416 Last data filed at 02/28/2019 1739 Gross per 24 hour  Intake 220 ml  Output --  Net 220 ml   Filed Weights   02/26/19 1935  Weight: 60.7 kg    Examination:  General: No acute distress. Cardiovascular: Heart sounds show Yashvi Jasinski regular rate, and rhythm. Lungs: Clear to auscultation bilaterally  Abdomen: Soft, nontender, nondistended  Neurological: Alert and disoriented.  Exam limited by encephalopathy and hearing impairment.  L arm and leg flaccid, withdraw to painful stimuli.  L sided neglect. Cranial nerves II  through XII grossly intact. Skin: Warm and dry. No rashes or lesions. Extremities: No clubbing or cyanosis. No edema.   Data Reviewed: I have personally reviewed following labs and imaging studies  CBC: Recent Labs  Lab 02/26/19 2341 02/27/19 0208 02/28/19 0146 03/01/19 0524  WBC 12.7* 13.7* 16.8* 19.5*  NEUTROABS 11.3* 12.5* 15.8* 17.9*  HGB 13.8 13.9 12.8* 13.4  HCT 43.8 43.4 39.1 42.1  MCV 82.2 81.4 79.6* 81.1  PLT 253 269 233 [id number]   Basic Metabolic Panel: Recent Labs  Lab 02/26/19 2341 02/27/19 0208 02/28/19 0146 03/01/19 0524  NA 137 140 138 142  K 3.8 3.9 3.8 3.8  CL 103 102 103 107  CO2 22 21* 21* 22  GLUCOSE 98 76 135* 112*  BUN 39* 40* 48* 53*  CREATININE 1.20 1.21 1.14 1.11  CALCIUM 8.7* 9.0 9.1 9.2  MG  --  2.2 2.3 2.3  PHOS  --  3.2 3.1 3.5   GFR: Estimated Creatinine Clearance: 39.3 mL/min (by C-G formula based on SCr of 1.11 mg/dL). Liver Function Tests: Recent Labs  Lab 02/26/19 2341 02/27/19 0208  02/28/19 0146 03/01/19 0524  AST 37 37 47* 75*  ALT 23 24 31  58*  ALKPHOS 87 99 120 121  BILITOT 1.1 1.0 0.6 0.8  PROT 7.0 7.2 6.7 6.8  ALBUMIN 2.8* 2.8* 2.4* 2.4*   No results for input(s): LIPASE, AMYLASE in the last 168 hours. No results for input(s): AMMONIA in the last 168 hours. Coagulation Profile: Recent Labs  Lab 03/01/19 1034  INR 2.7*   Cardiac Enzymes: No results for input(s): CKTOTAL, CKMB, CKMBINDEX, TROPONINI in the last 168 hours. BNP (last 3 results) No results for input(s): PROBNP in the last 8760 hours. HbA1C: Recent Labs    02/28/19 1800  HGBA1C 6.4*   CBG: Recent Labs  Lab 02/28/19 1949 03/01/19 0239 03/01/19 0425 03/01/19 0812 03/01/19 1212  GLUCAP 130* 119* 141* 112* 112*   Lipid Profile: No results for input(s): CHOL, HDL, LDLCALC, TRIG, CHOLHDL, LDLDIRECT in the last 72 hours. Thyroid Function Tests: No results for input(s): TSH, T4TOTAL, FREET4, T3FREE, THYROIDAB in the last 72 hours. Anemia Panel: Recent Labs    02/28/19 0146 03/01/19 0524  FERRITIN 155 172   Sepsis Labs: Recent Labs  Lab 02/26/19 2341 02/28/19 0146 03/01/19 0524  PROCALCITON 4.16 3.22 1.69    No results found for this or any previous visit (from the past 240 hour(s)).       Radiology Studies: CT Code Stroke CTA Head W/WO contrast  Result Date: 03/01/2019 CLINICAL DATA:  Right frontal stroke. EXAM: CT ANGIOGRAPHY HEAD AND NECK TECHNIQUE: Multidetector CT imaging of the head and neck was performed using the standard protocol during bolus administration of intravenous contrast. Multiplanar CT image reconstructions and MIPs were obtained to evaluate the vascular anatomy. Carotid stenosis measurements (when applicable) are obtained utilizing NASCET criteria, using the distal internal carotid diameter as the denominator. CONTRAST:  21mL OMNIPAQUE IOHEXOL 350 MG/ML SOLN COMPARISON:  Head CT same day FINDINGS: CTA NECK FINDINGS Aortic arch: Aortic atherosclerosis.  Branching pattern is normal without flow limiting origin stenosis. Right carotid system: Common carotid artery shows some scattered atherosclerotic plaque but is widely patent to the bifurcation. There is soft and calcified plaque at the carotid bifurcation and ICA bulb. There is occlusion of the ICA in the bulb, without reconstitution in the cervical region. Left carotid system: Common carotid artery shows some scattered plaque but is widely patent to the bifurcation. Minimal plaque  at the ICA bulb but no stenosis. Cervical ICA is widely patent. Vertebral arteries: Both vertebral artery origins show calcified plaque with stenosis estimated at 30-50% on each side. Beyond that, the vertebral arteries are widely patent through the cervical region to the foramen magnum. Skeleton: Ordinary cervical spondylosis. Other neck: No mass or lymphadenopathy. Upper chest: Emphysema. Pleural and parenchymal scarring at the apices. Review of the MIP images confirms the above findings CTA HEAD FINDINGS Anterior circulation: Left internal carotid artery is widely patent through the skull base and siphon region. Left internal carotid artery is patent through the skull base and siphon region. There is atherosclerotic calcification throughout the left carotid siphon with stenosis estimated at 30%. The left anterior and middle cerebral vessels are patent without large or medium vessel occlusion. The right internal carotid artery is occluded at the bifurcation and through the skull base. There is reconstitution in the distal siphon and supraclinoid region presumably from external to internal collaterals and cross flow from Dave Harris patent anterior and posterior communicating arteries. Small intraluminal filling defect in the supraclinoid ICA. The anterior and middle cerebral vessels show flow. I do not see Dave Harris large or medium vessel occlusion. Posterior circulation: Both vertebral arteries are patent through the foramen magnum. There is  atherosclerotic calcification in the vertebral V4 segments but no significant stenosis. Basilar artery is widely patent. Posterior circulation branch vessels appear normal. Venous sinuses: Patent and normal. Anatomic variants: None significant. Review of the MIP images confirms the above findings IMPRESSION: 1. Occlusion of the right ICA at the bifurcation and through the skull base. Reconstitution in the distal right siphon and supraclinoid region presumably from external to internal collaterals and cross flow from Dave Harris patent anterior and posterior communicating arteries. Small filling defect in the right supraclinoid ICA. No definable intracranial anterior circulation large or medium vessel occlusion. Presumed embolic occlusion of Dave Harris smaller right frontal MCA branch. 2. Atherosclerotic disease in the left carotid siphon with stenosis estimated at 30%. 3. Atherosclerotic disease at both vertebral artery origins with stenosis estimated at 30-50% on each side. Electronically Signed   By: Nelson Chimes M.D.   On: 03/01/2019 11:40   CT Code Stroke CTA Neck W/WO contrast  Result Date: 03/01/2019 CLINICAL DATA:  Right frontal stroke. EXAM: CT ANGIOGRAPHY HEAD AND NECK TECHNIQUE: Multidetector CT imaging of the head and neck was performed using the standard protocol during bolus administration of intravenous contrast. Multiplanar CT image reconstructions and MIPs were obtained to evaluate the vascular anatomy. Carotid stenosis measurements (when applicable) are obtained utilizing NASCET criteria, using the distal internal carotid diameter as the denominator. CONTRAST:  59mL OMNIPAQUE IOHEXOL 350 MG/ML SOLN COMPARISON:  Head CT same day FINDINGS: CTA NECK FINDINGS Aortic arch: Aortic atherosclerosis. Branching pattern is normal without flow limiting origin stenosis. Right carotid system: Common carotid artery shows some scattered atherosclerotic plaque but is widely patent to the bifurcation. There is soft and calcified  plaque at the carotid bifurcation and ICA bulb. There is occlusion of the ICA in the bulb, without reconstitution in the cervical region. Left carotid system: Common carotid artery shows some scattered plaque but is widely patent to the bifurcation. Minimal plaque at the ICA bulb but no stenosis. Cervical ICA is widely patent. Vertebral arteries: Both vertebral artery origins show calcified plaque with stenosis estimated at 30-50% on each side. Beyond that, the vertebral arteries are widely patent through the cervical region to the foramen magnum. Skeleton: Ordinary cervical spondylosis. Other neck: No mass or lymphadenopathy. Upper  chest: Emphysema. Pleural and parenchymal scarring at the apices. Review of the MIP images confirms the above findings CTA HEAD FINDINGS Anterior circulation: Left internal carotid artery is widely patent through the skull base and siphon region. Left internal carotid artery is patent through the skull base and siphon region. There is atherosclerotic calcification throughout the left carotid siphon with stenosis estimated at 30%. The left anterior and middle cerebral vessels are patent without large or medium vessel occlusion. The right internal carotid artery is occluded at the bifurcation and through the skull base. There is reconstitution in the distal siphon and supraclinoid region presumably from external to internal collaterals and cross flow from Ivann Trimarco patent anterior and posterior communicating arteries. Small intraluminal filling defect in the supraclinoid ICA. The anterior and middle cerebral vessels show flow. I do not see Dave Harris large or medium vessel occlusion. Posterior circulation: Both vertebral arteries are patent through the foramen magnum. There is atherosclerotic calcification in the vertebral V4 segments but no significant stenosis. Basilar artery is widely patent. Posterior circulation branch vessels appear normal. Venous sinuses: Patent and normal. Anatomic variants: None  significant. Review of the MIP images confirms the above findings IMPRESSION: 1. Occlusion of the right ICA at the bifurcation and through the skull base. Reconstitution in the distal right siphon and supraclinoid region presumably from external to internal collaterals and cross flow from Chelse Matas patent anterior and posterior communicating arteries. Small filling defect in the right supraclinoid ICA. No definable intracranial anterior circulation large or medium vessel occlusion. Presumed embolic occlusion of Dave Harris smaller right frontal MCA branch. 2. Atherosclerotic disease in the left carotid siphon with stenosis estimated at 30%. 3. Atherosclerotic disease at both vertebral artery origins with stenosis estimated at 30-50% on each side. Electronically Signed   By: Nelson Chimes M.D.   On: 03/01/2019 11:40   DG CHEST PORT 1 VIEW  Result Date: 03/01/2019 CLINICAL DATA:  COVID pneumonia. Hypoxia. EXAM: PORTABLE CHEST 1 VIEW COMPARISON:  02/26/2019 FINDINGS: Worsening lung aeration with progressive bilateral interstitial and airspace infiltrates consistent with COVID pneumonia. No pleural effusions. No pneumothorax. IMPRESSION: Worsening lung aeration with progressive bilateral infiltrates. Electronically Signed   By: Marijo Sanes M.D.   On: 03/01/2019 10:02   ECHOCARDIOGRAM COMPLETE  Result Date: 02/27/2019   ECHOCARDIOGRAM REPORT   Patient Name:   KOLSEN ILEY Date of Exam: 02/27/2019 Medical Rec #:  [id number]            Height:       64.0 in Accession #:    [id number]           Weight:       133.8 lb Date of Birth:  Oct 04, 1929            BSA:          1.65 m Patient Age:    17 years             BP:           106/72 mmHg Patient Gender: M                    HR:           79 bpm. Exam Location:  Inpatient Procedure: 2D Echo Indications:    elevated troponin  History:        Patient has prior history of Echocardiogram examinations, most                 recent 06/16/2012. CAD, Prior  CABG, Covid pneumonia,                  Arrythmias:Atrial Fibrillation; Risk Factors:Dyslipidemia.  Sonographer:    Johny Chess Referring Phys: [phone] Eldredge Veldhuizen CALDWELL POWELL [place]  1. Left ventricular ejection fraction, by visual estimation, is 50 to 55%. The left ventricle has normal function. There is no left ventricular hypertrophy.  2. The left ventricle demonstrates regional wall motion abnormalities.  3. Inferior basal hypokinesis.  4. Global right ventricle has normal systolic function.The right ventricular size is normal. No increase in right ventricular wall thickness.  5. Left atrial size was moderately dilated.  6. Right atrial size was moderately dilated.  7. The mitral valve is normal in structure. Mild mitral valve regurgitation. No evidence of mitral stenosis.  8. The tricuspid valve is normal in structure.  9. The aortic valve is tricuspid. Aortic valve regurgitation is not visualized. Mild aortic valve sclerosis without stenosis. 10. Pulmonic regurgitation is moderate. 11. The pulmonic valve was not well visualized. Pulmonic valve regurgitation is moderate. 12. Moderately elevated pulmonary artery systolic pressure. 13. The inferior vena cava is normal in size with greater than 50% respiratory variability, suggesting right atrial pressure of 3 mmHg. FINDINGS  Left Ventricle: Left ventricular ejection fraction, by visual estimation, is 50 to 55%. The left ventricle has normal function. The left ventricle demonstrates regional wall motion abnormalities. There is no left ventricular hypertrophy. Normal left atrial pressure. Inferior basal hypokinesis. Right Ventricle: The right ventricular size is normal. No increase in right ventricular wall thickness. Global RV systolic function is has normal systolic function. The tricuspid regurgitant velocity is 3.04 m/s, and with an assumed right atrial pressure  of 3 mmHg, the estimated right ventricular systolic pressure is moderately elevated at 40.0 mmHg. Left Atrium: Left atrial  size was moderately dilated. Right Atrium: Right atrial size was moderately dilated Pericardium: There is no evidence of pericardial effusion. Mitral Valve: The mitral valve is normal in structure. There is mild thickening of the mitral valve leaflet(s). Mild mitral valve regurgitation. No evidence of mitral valve stenosis by observation. Tricuspid Valve: The tricuspid valve is normal in structure. Tricuspid valve regurgitation is not demonstrated. Aortic Valve: The aortic valve is tricuspid. Aortic valve regurgitation is not visualized. Mild aortic valve sclerosis is present, with no evidence of aortic valve stenosis. Pulmonic Valve: The pulmonic valve was not well visualized. Pulmonic valve regurgitation is moderate. Pulmonic regurgitation is moderate. Aorta: The aortic root, ascending aorta and aortic arch are all structurally normal, with no evidence of dilitation or obstruction. Venous: The inferior vena cava is normal in size with greater than 50% respiratory variability, suggesting right atrial pressure of 3 mmHg. IAS/Shunts: No atrial level shunt detected by color flow Doppler. There is no evidence of Benzion Mesta patent foramen ovale. No ventricular septal defect is seen or detected. There is no evidence of an atrial septal defect.  LEFT VENTRICLE PLAX 2D LVIDd:         4.30 cm LVIDs:         3.31 cm LV PW:         1.20 cm LV IVS:        0.99 cm LVOT diam:     1.90 cm LV SV:         39 ml LV SV Index:   23.24 LVOT Area:     2.84 cm  RIGHT VENTRICLE TAPSE (M-mode): 1.2 cm LEFT ATRIUM  Index       RIGHT ATRIUM           Index LA diam:        4.40 cm 2.67 cm/m  RA Area:     24.80 cm LA Vol (A2C):   84.5 ml 51.24 ml/m RA Volume:   81.80 ml  49.60 ml/m LA Vol (A4C):   79.7 ml 48.33 ml/m LA Biplane Vol: 84.1 ml 50.99 ml/m   AORTA Ao Root diam: 3.30 cm TRICUSPID VALVE TR Peak grad:   37.0 mmHg TR Vmax:        338.00 cm/s  SHUNTS Systemic Diam: 1.90 cm  Jenkins Rouge MD Electronically signed by Jenkins Rouge  MD Signature Date/Time: 02/27/2019/3:48:13 PM    Final    CT HEAD CODE STROKE WO CONTRAST`  Result Date: 03/01/2019 CLINICAL DATA:  Code stroke. Neuro deficit, acute, stroke suspected. Additional history provided: Stroke, last known normal 0800 EXAM: CT HEAD WITHOUT CONTRAST TECHNIQUE: Contiguous axial images were obtained from the base of the skull through the vertex without intravenous contrast. COMPARISON:  Head CT 07/03/2018, brain MRI 04/05/2018 FINDINGS: Brain: Mildly motion degraded examination. No evidence of acute intracranial hemorrhage. There is an acute cortical/subcortical infarct within the anterior right frontal lobe (series 2, image 17). Redemonstrated chronic cortical/subcortical infarct within the left superior frontal gyrus. No evidence of intracranial mass. No midline shift or extra-axial fluid collection. Stable background generalized parenchymal atrophy and chronic small vessel ischemic disease. Redemonstrated chronic lacunar infarct within the right caudate nucleus. Vascular: No definite hyperdense vessel. Skull: Normal. Negative for fracture or focal lesion. Sinuses/Orbits: Visualized orbits demonstrate no acute abnormality. ASPECTS ([place] Stroke Program Early CT Score) - Ganglionic level infarction (caudate, lentiform nuclei, internal capsule, insula, M1-M3 cortex): 7 - Supraganglionic infarction (M4-M6 cortex): 2 (point subtracted for acute infarct in the right M5 territory) Total score (0-10 with 10 being normal): 9 These results were called by telephone at the time of interpretation on 03/01/2019 at 11:03 am to provider Dr. Leonel Ramsay, who verbally acknowledged these results. IMPRESSION: 1. Small acute cortical/subcortical infarct within the anterior right frontal lobe. ASPECTS 9. No hemorrhagic conversion or significant mass effect. 2. Redemonstrated chronic cortically based infarct within the left superior frontal gyrus. 3. Generalized parenchymal atrophy and chronic small vessel  ischemic disease. Redemonstrated chronic lacunar infarct within the right caudate nucleus Electronically Signed   By: Kellie Simmering DO   On: 03/01/2019 11:07        Scheduled Meds: . aspirin  81 mg Oral Daily  . atorvastatin  40 mg Oral QHS  . carvedilol  6.25 mg Oral BID WC  . cholecalciferol  2,000 Units Oral Daily  . docusate sodium  100 mg Oral BID  . finasteride  5 mg Oral Daily  . fluticasone furoate-vilanterol  1 puff Inhalation Daily  . guaiFENesin  600 mg Oral BID  . insulin aspart  0-15 Units Subcutaneous Q4H  . Ipratropium-Albuterol  1 puff Inhalation Q6H  . loratadine  10 mg Oral Daily  . methylPREDNISolone (SOLU-MEDROL) injection  40 mg Intravenous Q12H  . mirtazapine  30 mg Oral QHS  . pantoprazole  40 mg Oral Daily  . tamsulosin  0.4 mg Oral QHS  . vitamin B-12  1,000 mcg Oral Daily   Continuous Infusions: . azithromycin 500 mg (03/01/19 1240)  . cefTRIAXone (ROCEPHIN)  IV 1 g (03/01/19 1313)  . remdesivir 100 mg in NS 100 mL 100 mg (03/01/19 0923)     LOS: 3 days  Time spent: over 30 min 40 min critical care time with acute CVA    Fayrene Helper, MD [facility] Pager AMION  If 7PM-7AM, please contact night-coverage [email or web address] Password [email or web address] 03/01/2019, 2:16 PM

## 2019-03-01 NOTE — Consult Note (Addendum)
I connected with  Dave Harris on 03/03/19 by a video enabled telemedicine application and verified that I am speaking with the correct person using two identifiers.  I was unable to discuss the limitations of telemedicine with the patient due to mental status and urgent nature of the evaluation.    Location: Patient: [facility] Provider: [facility]   Neurology Consultation Reason for Consult: Left Sided weakness Referring Physician: Florene Glen, A  CC: Left Sided weakness  History is obtained from:patient  HPI: Dave Harris is a 86 y.o. male with a history of carotid stenosis, stroke, CAD was on anticoagulation due to atrial fibrillation with Eliquis.  He was last known to be normal yesterday afternoon.  He is very hard of hearing which makes intermittent responses more common and therefore it is unclear exactly when the left-sided weakness began.  Today, however, he does have significant left-sided weakness and a right-sided gaze preference.   At baseline per his nursing home, he does have some difficulty with memory.  He needs frequent assistance remembering to take medications.  Though he was able to walk independently, he has been having more difficulty with this recently and has started needing help to walk.  Once he is up and moving, however, he is able to attend to his own toileting/showers and eats unassisted once the food is set in front of him.  Of note, it does not sound like his daughter is aware that his mental faculties have declined some over the past year and she was not aware of the dementia symptoms.   LKW: Yesterday afternoon tpa given?: no, anticoagulation Premorbid modified rankin scale: 3-4 NIHSS:   ROS: Unable to assess secondary to patient's altered mental status.     Past Medical History:  Diagnosis Date  . A-fib ([place]) 06/10/2012  . Allergy   . Bilateral carotid artery stenosis 09/03/2016  . Bruit 06/10/2012  . CAD (coronary  artery disease)   . Carotid stenosis   . Cerebrovascular accident (CVA) due to embolism of left middle cerebral artery ([place]) 08/22/2015  . CHF (congestive heart failure) ([place])   . Chronic anticoagulation 08/22/2015  . Chronic atrial fibrillation ([place])   . Complication of anesthesia    "sometimes I'm hard to wake up (05/23/2015)  . Coronary artery disease involving native coronary artery of native heart with angina pectoris ([place]) 07/21/2014  . CVA (cerebral infarction) 05/22/2015  . Depression   . Essential hypertension 07/21/2014  . Grief reaction 12/11/2015  . Heart disease   . Heart disease   . History of blood transfusion    "w/my heart OR"  . History of stomach ulcers "sever"  . HLD (hyperlipidemia)   . Hyperlipidemia   . Hypertension   . Myocardial infarction ([place])   . Pure hypercholesterolemia 07/21/2014  . S/P carotid endarterectomy 06/19/2015  . Stroke [facility]) several   "6 since June 2016; last one was 05/19/2015"; denies residual  (05/23/2015)     Family History  Problem Relation Age of Onset  . CAD Other   . Heart disease Brother        before age 38  . Heart attack Brother      Social History:  reports that he has quit smoking. His smoking use included cigarettes. He has a 60.00 pack-year smoking history. He has never used smokeless tobacco. He reports that he does not drink alcohol or use drugs.   Exam: Current vital signs: BP (!) 142/87 (BP Location: Right Arm)  Pulse 84   Temp (!) 97.5 F (36.4 C) (Oral)   Resp 15   Ht 5\' 4"  (1.626 m)   Wt 60.7 kg   SpO2 92%   BMI 22.97 kg/m  Vital signs in last 24 hours: Temp:  [97.5 F (36.4 C)-98.3 F (36.8 C)] 97.5 F (36.4 C) (01/19 0800) Pulse Rate:  [71-84] 84 (01/19 0800) Resp:  [15-20] 15 (01/19 0400) BP: (115-142)/(71-87) 142/87 (01/19 0800) SpO2:  [84 %-92 %] 92 % (01/19 0835)  Limited due to being performed via telemedicine consult: Physical Exam  Constitutional: Appears elderly  Neuro: Mental  Status: Patient is awake, alert, oriented to person, place, month, year, and situation. Patient is able to give a clear and coherent history. He does not appear to be attending to the left side Cranial Nerves: II: Visual Fields are full. Pupils are equal, round, and reactive to light.   III,IV, VI: He has a right gaze preference, does not cross midline to the left V: VII: Mildly diminished movement left lower face Motor: He moves both his right arm and right leg against gravity, though he does not cooperate with holding it aloft due to complaints, he appears flaccid his left arm and leg Sensory: He withdraws to noxious stimulation Cerebellar: Does not perform  NIHSS score: 25 1A: Level of Consciousness - 1 1B: Ask Month and Age - 2 1C: 'Blink Eyes' & 'Squeeze Hands' - 2 2: Test Horizontal Extraocular Movements - 1 3: Test Visual Fields - 2 4: Test Facial Palsy - 1 5A: Test Left Arm Motor Drift - 4 5B: Test Right Arm Motor Drift - 2 6A: Test Left Leg Motor Drift - 3 6B: Test Right Leg Motor Drift - 1 7: Test Limb Ataxia - 0 8: Test Sensation - 0 9: Test Language/Aphasia- 2 10: Test Dysarthria - 2 11: Test Extinction/Inattention - 2   I have reviewed labs in epic and the results pertinent to this consultation are: Albumin 2.4  I have reviewed the images obtained: CT head-small right frontal infarct, CTA-occluded right carotid  Impression: 86 year old male with right carotid occlusion in setting of Covid pneumonia.  At baseline, he does have dementia (mild) and needs assistance with his daily activities including walking.  In the setting, returning him to an independent state I think is very unlikely.  I discussed this with the daughter and she agrees with holding off on any heroic measures such as acute stroke intervention.  He may benefit from some blood pressure augmentation with IV fluids. If possible, I would favor holding anticoagulation as he is likely to have a very large  stroke with risk of hemorrhagic conversion.  He had a known stenosis of his right carotid and I suspect that this is progressive intrinsic disease as opposed to embolus.  An echocardiogram could be reasonable, however, because if he was found to have cardiac thrombus then more aggressive reinstitution of anticoagulation would be indicated.  Recommendations: 1) fluid bolus 2) aspirin 81mg  daily 3) PT, OT, ST 4) I confirmed DNR status with daughter. I strongly suspect that if he worsens and this becomes life threatening that she will move to comfort care.  5) okay for prophylactic heparin 6) frequent neuro checks 7) repeat ct in morning  Roland Rack, MD [facility] [phone]  If 7pm- 7am, please page neurology on call as listed in [place].   I provided 80 minutes of non-face-to-face time during this encounter.

## 2019-03-01 NOTE — Progress Notes (Signed)
Called to rapid response Code Stroke in room 105 with neuro assessment computer. Primary MD at bedside. Neuro, Elink, [place] notified. Last known time of any normal was 0800 per primary RN. RAC 20g IV patent. New LFA 20g IV initiated for stat Head CT. CBG 112. BP 124/74, 154/77, 135/72. Neuro verified results of CT and new orders to be addressed.

## 2019-03-01 NOTE — Progress Notes (Signed)
PT Cancellation Note  Patient Details Name: Dave Harris MRN: [id number] DOB: 02/27/1929   Cancelled Treatment:    Reason Eval/Treat Not Completed: Medical issues which prohibited therapy;Other (comment)(At 1030 AM patient had CODED and medical team was still in room)  Rollen Sox, PT # [phone] CGV cell  Casandra Doffing 03/01/2019, 10:38 AM

## 2019-03-02 ENCOUNTER — Inpatient Hospital Stay (HOSPITAL_COMMUNITY): Payer: PPO

## 2019-03-02 DIAGNOSIS — I6340 Cerebral infarction due to embolism of unspecified cerebral artery: Secondary | ICD-10-CM | POA: Insufficient documentation

## 2019-03-02 DIAGNOSIS — I639 Cerebral infarction, unspecified: Secondary | ICD-10-CM

## 2019-03-02 LAB — COMPREHENSIVE METABOLIC PANEL
ALT: 96 U/L — ABNORMAL HIGH (ref 0–44)
AST: 113 U/L — ABNORMAL HIGH (ref 15–41)
Albumin: 2.4 g/dL — ABNORMAL LOW (ref 3.5–5.0)
Alkaline Phosphatase: 135 U/L — ABNORMAL HIGH (ref 38–126)
Anion gap: 14 (ref 5–15)
BUN: 56 mg/dL — ABNORMAL HIGH (ref 8–23)
CO2: 23 mmol/L (ref 22–32)
Calcium: 9.1 mg/dL (ref 8.9–10.3)
Chloride: 108 mmol/L (ref 98–111)
Creatinine, Ser: 1.3 mg/dL — ABNORMAL HIGH (ref 0.61–1.24)
GFR calc Af Amer: 57 mL/min — ABNORMAL LOW (ref >60)
GFR calc non Af Amer: 49 mL/min — ABNORMAL LOW (ref >60)
Glucose, Bld: 125 mg/dL — ABNORMAL HIGH (ref 70–99)
Potassium: 4.2 mmol/L (ref 3.5–5.1)
Sodium: 145 mmol/L (ref 135–145)
Total Bilirubin: 0.7 mg/dL (ref 0.3–1.2)
Total Protein: 7.1 g/dL (ref 6.5–8.1)

## 2019-03-02 LAB — CBC WITH DIFFERENTIAL/PLATELET
Abs Immature Granulocytes: 0.11 10*3/uL — ABNORMAL HIGH (ref 0.00–0.07)
Basophils Absolute: 0 10*3/uL (ref 0.0–0.1)
Basophils Relative: 0 %
Eosinophils Absolute: 0 10*3/uL (ref 0.0–0.5)
Eosinophils Relative: 0 %
HCT: 43.1 % (ref 39.0–52.0)
Hemoglobin: 13.3 g/dL (ref 13.0–17.0)
Immature Granulocytes: 1 %
Lymphocytes Relative: 5 %
Lymphs Abs: 0.7 10*3/uL (ref 0.7–4.0)
MCH: 25.5 pg — ABNORMAL LOW (ref 26.0–34.0)
MCHC: 30.9 g/dL (ref 30.0–36.0)
MCV: 82.7 fL (ref 80.0–100.0)
Monocytes Absolute: 0.4 10*3/uL (ref 0.1–1.0)
Monocytes Relative: 3 %
Neutro Abs: 12.2 10*3/uL — ABNORMAL HIGH (ref 1.7–7.7)
Neutrophils Relative %: 91 %
Platelets: 267 10*3/uL (ref 150–400)
RBC: 5.21 MIL/uL (ref 4.22–5.81)
RDW: 17.1 % — ABNORMAL HIGH (ref 11.5–15.5)
WBC: 13.5 10*3/uL — ABNORMAL HIGH (ref 4.0–10.5)
nRBC: 0 % (ref 0.0–0.2)

## 2019-03-02 LAB — GLUCOSE, CAPILLARY
Glucose-Capillary: 104 mg/dL — ABNORMAL HIGH (ref 70–99)
Glucose-Capillary: 115 mg/dL — ABNORMAL HIGH (ref 70–99)
Glucose-Capillary: 125 mg/dL — ABNORMAL HIGH (ref 70–99)
Glucose-Capillary: 135 mg/dL — ABNORMAL HIGH (ref 70–99)
Glucose-Capillary: 140 mg/dL — ABNORMAL HIGH (ref 70–99)

## 2019-03-02 LAB — LIPID PANEL
Cholesterol: 63 mg/dL (ref 0–200)
HDL: 34 mg/dL — ABNORMAL LOW (ref >40)
LDL Cholesterol: 24 mg/dL (ref 0–99)
Total CHOL/HDL Ratio: 1.9 RATIO
Triglycerides: 27 mg/dL (ref <150)
VLDL: 5 mg/dL (ref 0–40)

## 2019-03-02 LAB — HEMOGLOBIN A1C
Hgb A1c MFr Bld: 6.5 % — ABNORMAL HIGH (ref 4.8–5.6)
Mean Plasma Glucose: 139.85 mg/dL

## 2019-03-02 LAB — MAGNESIUM: Magnesium: 2.6 mg/dL — ABNORMAL HIGH (ref 1.7–2.4)

## 2019-03-02 LAB — FERRITIN: Ferritin: 160 ng/mL (ref 24–336)

## 2019-03-02 LAB — PHOSPHORUS: Phosphorus: 4.7 mg/dL — ABNORMAL HIGH (ref 2.5–4.6)

## 2019-03-02 LAB — C-REACTIVE PROTEIN: CRP: 22.1 mg/dL — ABNORMAL HIGH (ref <1.0)

## 2019-03-02 LAB — D-DIMER, QUANTITATIVE: D-Dimer, Quant: 14.18 ug/mL-FEU — ABNORMAL HIGH (ref 0.00–0.50)

## 2019-03-02 MED ORDER — INFLUENZA VAC A&B SA ADJ QUAD 0.5 ML IM PRSY
0.5000 mL | PREFILLED_SYRINGE | INTRAMUSCULAR | Status: DC
  Filled 2019-03-02: qty 0.5

## 2019-03-02 MED ORDER — PNEUMOCOCCAL VAC POLYVALENT 25 MCG/0.5ML IJ INJ
0.5000 mL | INJECTION | INTRAMUSCULAR | Status: DC
  Filled 2019-03-02: qty 0.5

## 2019-03-02 NOTE — Evaluation (Addendum)
Physical Therapy Evaluation Patient Details Name: Dave Harris MRN: [id number] DOB: October 22, 1929 Today's Date: 03/02/2019   History of Present Illness  86 year old male admitted with COVID; PMH includes CAD, CABG, Chronic AFib with multiple CVAs, carod artery stenosis, HTN.He'd been improving from a respiratory status and was on RA on 1/18, but on 1/19 he was noted to have L sided weakness and neglect concerning for stroke as well as worsening encephalopathy and hypoxia. CT 1/20--.Expected evolutionary change of the patient's right frontal infarct.  Clinical Impression  The patient  Received on RA, SPO2 91%. While mobilizing/sitting at bed edge, SPO2 trended down to low 80's Placed on5 then up to 10 L HFNC to get SPO2 back to 88%. MD and RN aware. The patient has dense left hemiplegia with increased flexor withdrawal tone of LLE, Left gaze preference, although patient did follow to left past midline with head and eyes briefly. Patient does not follow verba/tactile commands, known to be HOH.  Patient was ambulatory at facility by report. Pt admitted with above diagnosis.  Pt currently with functional limitations due to the deficits listed below (see PT Problem List). Pt will benefit from skilled PT to increase their independence and safety with mobility to allow discharge to the venue listed below.       Follow Up Recommendations SNF    Equipment Recommendations  None recommended by PT    Recommendations for Other Services       Precautions / Restrictions Precautions Precautions: Fall Precaution Comments: desats      Mobility  Bed Mobility Overal bed mobility: Needs Assistance Bed Mobility: Rolling;Sidelying to Sit;Sit to Sidelying Rolling: Total assist;+2 for physical assistance;+2 for safety/equipment Sidelying to sit: +2 for safety/equipment;+2 for physical assistance;Total assist     Sit to sidelying: Total assist;+2 for physical assistance;+2 for safety/equipment General  bed mobility comments: patient does not follow gestures or verbal. Assisted to roll and sit up from right sidelying. Patient di not assist in any way.. Returned to right side , to supine with total assist and [place] of bed pad.  Transfers                 General transfer comment: NT  Ambulation/Gait                Stairs            Wheelchair Mobility    Modified Rankin (Stroke Patients Only)       Balance Overall balance assessment: Needs assistance Sitting-balance support: Single extremity supported;Feet supported Sitting balance-Leahy Scale: Zero Sitting balance - Comments: sits at midline with mod assist, not pushing, does hold bed rail. will loose balance if not supported. At times, noted to attempt  trunk control when titubated..                                     Pertinent Vitals/Pain Pain Assessment: Faces    Home Living Family/patient expects to be discharged to:: Skilled nursing facility                 Additional Comments: per chart, was ambulatory at facility    Prior Function           Comments: uncertain, resides at ALF     Hand Dominance        Extremity/Trunk Assessment        Lower Extremity Assessment Lower Extremity Assessment: RLE  deficits/detail;LLE deficits/detail RLE Deficits / Details: moves spontaneously to right bed edge LLE Deficits / Details: increased flexor withdrawal then touched foot, upgoing toes, increased tone to PROM at hip and knee    Cervical / Trunk Assessment Cervical / Trunk Assessment: Kyphotic  Communication   Communication: Expressive difficulties;Receptive difficulties;HOH  Cognition Arousal/Alertness: Awake/alert   Overall Cognitive Status: Difficult to assess                                        General Comments      Exercises     Assessment/Plan    PT Assessment Patient needs continued PT services  PT Problem List Decreased  mobility;Impaired tone;Decreased knowledge of precautions;Decreased activity tolerance;Decreased cognition;Cardiopulmonary status limiting activity;Decreased balance       PT Treatment Interventions Therapeutic activities;Cognitive remediation;Therapeutic exercise;Patient/family education;Functional mobility training;Balance training    PT Goals (Current goals can be found in the Care Plan section)  Acute Rehab PT Goals Patient Stated Goal: pt unable PT Goal Formulation: Patient unable to participate in goal setting Time For Goal Achievement: 03/16/19 Potential to Achieve Goals: Poor    Frequency Min 2X/week   Barriers to discharge        Co-evaluation               AM-PAC PT "6 Clicks" Mobility  Outcome Measure Help needed turning from your back to your side while in a flat bed without using bedrails?: Total Help needed moving from lying on your back to sitting on the side of a flat bed without using bedrails?: Total Help needed moving to and from a bed to a chair (including a wheelchair)?: Total Help needed standing up from a chair using your arms (e.g., wheelchair or bedside chair)?: Total Help needed to walk in hospital room?: Total Help needed climbing 3-5 steps with a railing? : Total 6 Click Score: 6    End of Session Equipment Utilized During Treatment: Oxygen Activity Tolerance: Patient limited by fatigue;Treatment limited secondary to medical complications (Comment) Patient left: in bed;with call bell/phone within reach;with bed alarm set(bed alarm pad placed) Nurse Communication: Mobility status;Need for lift equipment PT Visit Diagnosis: Difficulty in walking, not elsewhere classified (R26.2)    Time: [id number] PT Time Calculation (min) (ACUTE ONLY): 33 min   Charges:   PT Evaluation $PT Eval Moderate Complexity: [place] Pager [phone] Office [phone]   Claretha Cooper 03/02/2019, 12:49 PM

## 2019-03-02 NOTE — Progress Notes (Signed)
PROGRESS NOTE  Dave Harris  [id number] DOB: 1929/04/29 DOA: 02/26/2019 PCP: Garwin Brothers, MD   Brief Narrative: Dave Rolli [name] medical history significant ofCVA, CAD, Atrial Fibrillation on Apixaban, Carotid artery stenosis, HTN, HLD,BPH andDepression, who initially presented from his ALF to [facility] ED with hypoxemic respiratory failure with initial oxygen sats of 60s.Patient was placed on NRB, given Remdesivir, Dexamethasone as well as antibiotics prior to transfer.  He has significant hearing impairment which limits communication.   He'd been improving from a respiratory status and was on RA on 1/18, but on 1/19 he was noted to have L sided weakness and neglect concerning for stroke as well as worsening encephalopathy and hypoxia. Head CT confirmed left frontal CVA. Neurology was consulted and repeat head CT 1/20 confirms expected evolutionary changes.   Assessment & Plan: Active Problems:   Pneumonia due to COVID-19 virus   Cerebral embolism with cerebral infarction  Acute hypoxemic respiratory failure due to covid-19 pneumonia and superimposed bacterial pneumonia: Continues to have significant exertional hypoxia.  - Complete remdesivir x5 days   - Steroids continue x10 days  - Vitamin C, zinc - Encourage OOB, IS, FV, and awake proning if able - Tylenol and antitussives prn - Continue airborne, contact precautions while admitted. Isolation period would be recommended for 21 days from positive testing. - Check CBC w/diff, CMP, CRP daily. Also trend PCT, repeat CXR in AM pending goals of care discussions. - Maintain euvolemia/net negative.  - Avoid NSAIDs  - CRP rising, ?aspiration in setting of new CVA. Continue CTX, azithromycin.  D-dimer continues rising despite having received therapeutic anticoagulation. Now have to hold with CVA and risk of hemorrhagic conversion. Hypoxia also worsened, so may need to consider CTA chest. Did  recently receive dye loads for head, neck angiography so would prefer to avoid additional angio at this time. In any case, this study at this time would not alter management.   Right frontal CVA in patient with history of carotid stenosis and CVA 2017 (L MCA s/p left CEA):  - Repeat CT head this morning showing expected evolutionary changes. Exam findings consistent with this infarct. - ASA, may start heparin gtt per neurology. D/w Dr. Leonie Harris.  - PT, OT, SLP - Hx AFib, restart anticoagulation when cleared by neurology. Echocardiogram recently performed showed no cardioembolic source. Per neurology, more likely due to established atherosclerotic burden. Continue permissive HTN/avoid hypotension.  - SLP evaluation still pending. We will discuss goals of care with family in more depth and if unable to swallow, would suggest comfort measures as a feeding tube would be unlikely to change this patient's outcome.  Acute metabolic encephalopathy on chronic dementia:  - Delirium precautions. Exacerbated by new neurological deficits and extreme HOH.  HTN, HFpEF, CAD with type II NSTEMI: Echocardiogram showed inferior hypokinesis. No STEMI on ECG nor chest pain reported.    - Permissive HTN for now. Otherwise continue BB, statin, ASA, anticoagulation.  - Planning outpatient cardiology evaluation pending clinical course.  - Continue home lasix pending SLP evaluation   NSVT:  - Restart BB when able.   GERD:  - PPI when able  Depression:  - Continue remeron  BPH:  - Continue flomax, finasteride  Prediabetes with steroid-induced hyperglycemia:  - SSI  DVT prophylaxis: Heparin Code Status: DNR Family Communication: Neurology spoke with daughter, will keep in contact. Disposition Plan: Uncertain  Consultants:   Neurology  Procedures:   Echocardiogram  Antimicrobials:  Ceftriaxone, azithromycin, remdesivir   Subjective:  Not verbal, did not attempt to read "What is your name?" written  on paper. Very weak diffusely, poor balance, and no movement out of left side. Had to be put on oxygen again this morning.   Objective: Vitals:   03/01/19 1932 03/02/19 0024 03/02/19 0353 03/02/19 0748  BP: (!) 132/52 (!) 166/91 (!) 159/98 (!) 163/89  Pulse: 66 95 90   Resp: 20 (!) 23 20   Temp: 97.6 F (36.4 C) 97.6 F (36.4 C) (!) 97.4 F (36.3 C) 98.4 F (36.9 C)  TempSrc: Axillary Axillary Axillary Axillary  SpO2: 92% 92% 91%   Weight:      Height:        Intake/Output Summary (Last 24 hours) at 03/02/2019 1024 Last data filed at 03/02/2019 0400 Gross per 24 hour  Intake 450 ml  Output 1060 ml  Net -610 ml   Filed Weights   02/26/19 1935  Weight: 60.7 kg    Gen: Elderly male in no distress Pulm: Non-labored breathing with diffuse significant crackles.  CV: Regular rate and rhythm. No murmur, rub, or gallop. No JVD, no pedal edema. GI: Abdomen soft, non-tender, non-distended, with normoactive bowel sounds. No organomegaly or masses felt. Ext: Warm, no deformities Skin: No rashes, lesions or ulcers on visualized skin. Neuro: Alert, drowsy, left with flaccid paresis UE and LE. RLE with resistance to motion and right grip reflexively intact. Does not move voluntarily. Right gaze deviation will not track past midline. Possible neglect on left.  Psych: Judgement and insight appear normal. Mood & affect appropriate.   Data Reviewed: I have personally reviewed following labs and imaging studies  CBC: Recent Labs  Lab 02/26/19 2341 02/27/19 0208 02/28/19 0146 03/01/19 0524 03/02/19 0356  WBC 12.7* 13.7* 16.8* 19.5* 13.5*  NEUTROABS 11.3* 12.5* 15.8* 17.9* 12.2*  HGB 13.8 13.9 12.8* 13.4 13.3  HCT 43.8 43.4 39.1 42.1 43.1  MCV 82.2 81.4 79.6* 81.1 82.7  PLT 253 269 233 285 [id number]   Basic Metabolic Panel: Recent Labs  Lab 02/26/19 2341 02/27/19 0208 02/28/19 0146 03/01/19 0524 03/02/19 0356  NA 137 140 138 142 145  K 3.8 3.9 3.8 3.8 4.2  CL 103 102 103 107 108    CO2 22 21* 21* 22 23  GLUCOSE 98 76 135* 112* 125*  BUN 39* 40* 48* 53* 56*  CREATININE 1.20 1.21 1.14 1.11 1.30*  CALCIUM 8.7* 9.0 9.1 9.2 9.1  MG  --  2.2 2.3 2.3 2.6*  PHOS  --  3.2 3.1 3.5 4.7*   GFR: Estimated Creatinine Clearance: 33.5 mL/min (A) (by C-G formula based on SCr of 1.3 mg/dL (H)). Liver Function Tests: Recent Labs  Lab 02/26/19 2341 02/27/19 0208 02/28/19 0146 03/01/19 0524 03/02/19 0356  AST 37 37 47* 75* 113*  ALT 23 24 31  58* 96*  ALKPHOS 87 99 120 121 135*  BILITOT 1.1 1.0 0.6 0.8 0.7  PROT 7.0 7.2 6.7 6.8 7.1  ALBUMIN 2.8* 2.8* 2.4* 2.4* 2.4*   No results for input(s): LIPASE, AMYLASE in the last 168 hours. No results for input(s): AMMONIA in the last 168 hours. Coagulation Profile: Recent Labs  Lab 03/01/19 1034  INR 2.7*   Cardiac Enzymes: No results for input(s): CKTOTAL, CKMB, CKMBINDEX, TROPONINI in the last 168 hours. BNP (last 3 results) No results for input(s): PROBNP in the last 8760 hours. HbA1C: Recent Labs    02/28/19 1800 03/02/19 0356  HGBA1C 6.4* 6.5*   CBG: Recent Labs  Lab 03/01/19 1523 03/01/19  2013 03/02/19 0020 03/02/19 0351 03/02/19 0747  GLUCAP 139* 149* 140* 115* 125*   Lipid Profile: Recent Labs    03/02/19 0356  CHOL 63  HDL 34*  LDLCALC 24  TRIG 27  CHOLHDL 1.9   Thyroid Function Tests: No results for input(s): TSH, T4TOTAL, FREET4, T3FREE, THYROIDAB in the last 72 hours. Anemia Panel: Recent Labs    03/01/19 0524 03/02/19 0356  FERRITIN 172 160   Urine analysis:    Component Value Date/Time   COLORURINE YELLOW 05/23/2015 1829   APPEARANCEUR CLEAR 05/23/2015 1829   LABSPEC 1.013 05/23/2015 1829   PHURINE 6.0 05/23/2015 1829   GLUCOSEU NEGATIVE 05/23/2015 1829   HGBUR NEGATIVE 05/23/2015 1829   BILIRUBINUR NEGATIVE 05/23/2015 1829   KETONESUR NEGATIVE 05/23/2015 1829   PROTEINUR NEGATIVE 05/23/2015 1829   NITRITE NEGATIVE 05/23/2015 1829   LEUKOCYTESUR NEGATIVE 05/23/2015 1829    No results found for this or any previous visit (from the past 240 hour(s)).    Radiology Studies: CT Code Stroke CTA Head W/WO contrast  Result Date: 03/01/2019 CLINICAL DATA:  Right frontal stroke. EXAM: CT ANGIOGRAPHY HEAD AND NECK TECHNIQUE: Multidetector CT imaging of the head and neck was performed using the standard protocol during bolus administration of intravenous contrast. Multiplanar CT image reconstructions and MIPs were obtained to evaluate the vascular anatomy. Carotid stenosis measurements (when applicable) are obtained utilizing NASCET criteria, using the distal internal carotid diameter as the denominator. CONTRAST:  60mL OMNIPAQUE IOHEXOL 350 MG/ML SOLN COMPARISON:  Head CT same day FINDINGS: CTA NECK FINDINGS Aortic arch: Aortic atherosclerosis. Branching pattern is normal without flow limiting origin stenosis. Right carotid system: Common carotid artery shows some scattered atherosclerotic plaque but is widely patent to the bifurcation. There is soft and calcified plaque at the carotid bifurcation and ICA bulb. There is occlusion of the ICA in the bulb, without reconstitution in the cervical region. Left carotid system: Common carotid artery shows some scattered plaque but is widely patent to the bifurcation. Minimal plaque at the ICA bulb but no stenosis. Cervical ICA is widely patent. Vertebral arteries: Both vertebral artery origins show calcified plaque with stenosis estimated at 30-50% on each side. Beyond that, the vertebral arteries are widely patent through the cervical region to the foramen magnum. Skeleton: Ordinary cervical spondylosis. Other neck: No mass or lymphadenopathy. Upper chest: Emphysema. Pleural and parenchymal scarring at the apices. Review of the MIP images confirms the above findings CTA HEAD FINDINGS Anterior circulation: Left internal carotid artery is widely patent through the skull base and siphon region. Left internal carotid artery is patent through the  skull base and siphon region. There is atherosclerotic calcification throughout the left carotid siphon with stenosis estimated at 30%. The left anterior and middle cerebral vessels are patent without large or medium vessel occlusion. The right internal carotid artery is occluded at the bifurcation and through the skull base. There is reconstitution in the distal siphon and supraclinoid region presumably from external to internal collaterals and cross flow from a patent anterior and posterior communicating arteries. Small intraluminal filling defect in the supraclinoid ICA. The anterior and middle cerebral vessels show flow. I do not see a large or medium vessel occlusion. Posterior circulation: Both vertebral arteries are patent through the foramen magnum. There is atherosclerotic calcification in the vertebral V4 segments but no significant stenosis. Basilar artery is widely patent. Posterior circulation branch vessels appear normal. Venous sinuses: Patent and normal. Anatomic variants: None significant. Review of the MIP images confirms the above  findings IMPRESSION: 1. Occlusion of the right ICA at the bifurcation and through the skull base. Reconstitution in the distal right siphon and supraclinoid region presumably from external to internal collaterals and cross flow from a patent anterior and posterior communicating arteries. Small filling defect in the right supraclinoid ICA. No definable intracranial anterior circulation large or medium vessel occlusion. Presumed embolic occlusion of a smaller right frontal MCA branch. 2. Atherosclerotic disease in the left carotid siphon with stenosis estimated at 30%. 3. Atherosclerotic disease at both vertebral artery origins with stenosis estimated at 30-50% on each side. Electronically Signed   By: Nelson Chimes M.D.   On: 03/01/2019 11:40   CT Code Stroke CTA Neck W/WO contrast  Result Date: 03/01/2019 CLINICAL DATA:  Right frontal stroke. EXAM: CT ANGIOGRAPHY HEAD  AND NECK TECHNIQUE: Multidetector CT imaging of the head and neck was performed using the standard protocol during bolus administration of intravenous contrast. Multiplanar CT image reconstructions and MIPs were obtained to evaluate the vascular anatomy. Carotid stenosis measurements (when applicable) are obtained utilizing NASCET criteria, using the distal internal carotid diameter as the denominator. CONTRAST:  47mL OMNIPAQUE IOHEXOL 350 MG/ML SOLN COMPARISON:  Head CT same day FINDINGS: CTA NECK FINDINGS Aortic arch: Aortic atherosclerosis. Branching pattern is normal without flow limiting origin stenosis. Right carotid system: Common carotid artery shows some scattered atherosclerotic plaque but is widely patent to the bifurcation. There is soft and calcified plaque at the carotid bifurcation and ICA bulb. There is occlusion of the ICA in the bulb, without reconstitution in the cervical region. Left carotid system: Common carotid artery shows some scattered plaque but is widely patent to the bifurcation. Minimal plaque at the ICA bulb but no stenosis. Cervical ICA is widely patent. Vertebral arteries: Both vertebral artery origins show calcified plaque with stenosis estimated at 30-50% on each side. Beyond that, the vertebral arteries are widely patent through the cervical region to the foramen magnum. Skeleton: Ordinary cervical spondylosis. Other neck: No mass or lymphadenopathy. Upper chest: Emphysema. Pleural and parenchymal scarring at the apices. Review of the MIP images confirms the above findings CTA HEAD FINDINGS Anterior circulation: Left internal carotid artery is widely patent through the skull base and siphon region. Left internal carotid artery is patent through the skull base and siphon region. There is atherosclerotic calcification throughout the left carotid siphon with stenosis estimated at 30%. The left anterior and middle cerebral vessels are patent without large or medium vessel occlusion.  The right internal carotid artery is occluded at the bifurcation and through the skull base. There is reconstitution in the distal siphon and supraclinoid region presumably from external to internal collaterals and cross flow from a patent anterior and posterior communicating arteries. Small intraluminal filling defect in the supraclinoid ICA. The anterior and middle cerebral vessels show flow. I do not see a large or medium vessel occlusion. Posterior circulation: Both vertebral arteries are patent through the foramen magnum. There is atherosclerotic calcification in the vertebral V4 segments but no significant stenosis. Basilar artery is widely patent. Posterior circulation branch vessels appear normal. Venous sinuses: Patent and normal. Anatomic variants: None significant. Review of the MIP images confirms the above findings IMPRESSION: 1. Occlusion of the right ICA at the bifurcation and through the skull base. Reconstitution in the distal right siphon and supraclinoid region presumably from external to internal collaterals and cross flow from a patent anterior and posterior communicating arteries. Small filling defect in the right supraclinoid ICA. No definable intracranial anterior circulation large  or medium vessel occlusion. Presumed embolic occlusion of a smaller right frontal MCA branch. 2. Atherosclerotic disease in the left carotid siphon with stenosis estimated at 30%. 3. Atherosclerotic disease at both vertebral artery origins with stenosis estimated at 30-50% on each side. Electronically Signed   By: Nelson Chimes M.D.   On: 03/01/2019 11:40   DG CHEST PORT 1 VIEW  Result Date: 03/01/2019 CLINICAL DATA:  COVID pneumonia. Hypoxia. EXAM: PORTABLE CHEST 1 VIEW COMPARISON:  02/26/2019 FINDINGS: Worsening lung aeration with progressive bilateral interstitial and airspace infiltrates consistent with COVID pneumonia. No pleural effusions. No pneumothorax. IMPRESSION: Worsening lung aeration with  progressive bilateral infiltrates. Electronically Signed   By: Marijo Sanes M.D.   On: 03/01/2019 10:02   CT HEAD CODE STROKE WO CONTRAST`  Result Date: 03/01/2019 CLINICAL DATA:  Code stroke. Neuro deficit, acute, stroke suspected. Additional history provided: Stroke, last known normal 0800 EXAM: CT HEAD WITHOUT CONTRAST TECHNIQUE: Contiguous axial images were obtained from the base of the skull through the vertex without intravenous contrast. COMPARISON:  Head CT 07/03/2018, brain MRI 04/05/2018 FINDINGS: Brain: Mildly motion degraded examination. No evidence of acute intracranial hemorrhage. There is an acute cortical/subcortical infarct within the anterior right frontal lobe (series 2, image 17). Redemonstrated chronic cortical/subcortical infarct within the left superior frontal gyrus. No evidence of intracranial mass. No midline shift or extra-axial fluid collection. Stable background generalized parenchymal atrophy and chronic small vessel ischemic disease. Redemonstrated chronic lacunar infarct within the right caudate nucleus. Vascular: No definite hyperdense vessel. Skull: Normal. Negative for fracture or focal lesion. Sinuses/Orbits: Visualized orbits demonstrate no acute abnormality. ASPECTS ([place] Stroke Program Early CT Score) - Ganglionic level infarction (caudate, lentiform nuclei, internal capsule, insula, M1-M3 cortex): 7 - Supraganglionic infarction (M4-M6 cortex): 2 (point subtracted for acute infarct in the right M5 territory) Total score (0-10 with 10 being normal): 9 These results were called by telephone at the time of interpretation on 03/01/2019 at 11:03 am to provider Dr. Leonel Ramsay, who verbally acknowledged these results. IMPRESSION: 1. Small acute cortical/subcortical infarct within the anterior right frontal lobe. ASPECTS 9. No hemorrhagic conversion or significant mass effect. 2. Redemonstrated chronic cortically based infarct within the left superior frontal gyrus. 3.  Generalized parenchymal atrophy and chronic small vessel ischemic disease. Redemonstrated chronic lacunar infarct within the right caudate nucleus Electronically Signed   By: Kellie Simmering DO   On: 03/01/2019 11:07    Scheduled Meds: . aspirin  81 mg Oral Daily  . atorvastatin  40 mg Oral QHS  . cholecalciferol  2,000 Units Oral Daily  . docusate sodium  100 mg Oral BID  . finasteride  5 mg Oral Daily  . fluticasone furoate-vilanterol  1 puff Inhalation Daily  . guaiFENesin  600 mg Oral BID  . heparin injection (subcutaneous)  5,000 Units Subcutaneous Q8H  . [START ON 03/03/2019] influenza vaccine adjuvanted  0.5 mL Intramuscular Tomorrow-1000  . insulin aspart  0-15 Units Subcutaneous Q4H  . Ipratropium-Albuterol  1 puff Inhalation Q6H  . loratadine  10 mg Oral Daily  . methylPREDNISolone (SOLU-MEDROL) injection  40 mg Intravenous Q12H  . mirtazapine  30 mg Oral QHS  . pantoprazole  40 mg Oral Daily  . [START ON 03/03/2019] pneumococcal 23 valent vaccine  0.5 mL Intramuscular Tomorrow-1000  . tamsulosin  0.4 mg Oral QHS  . vitamin B-12  1,000 mcg Oral Daily   Continuous Infusions: . azithromycin 500 mg (03/01/19 1240)  . cefTRIAXone (ROCEPHIN)  IV 1 g (03/01/19 1313)  .  remdesivir 100 mg in NS 100 mL 100 mg (03/01/19 0923)     LOS: 4 days   Time spent: 35 minutes.  Patrecia Pour, MD [facility] [email or web address] 03/02/2019, 10:24 AM

## 2019-03-02 NOTE — Progress Notes (Signed)
Pt being transferred to 3rd floor, med-surg unit. Resting with eyes closed on RA. No distress noted.

## 2019-03-02 NOTE — Progress Notes (Signed)
STROKE TEAM PROGRESS NOTE  Virtual Visit via Video Note  I connected with Dave Harris on 03/02/19 at 1 pm   by a video enabled telemedicine application and verified that I am speaking with the correct person using two identifiers.  Location: Patient: at [facility] room 1 E 105 Provider: at [place]   I discussed the limitations of evaluation and management by telemedicine and the availability of in person appointments. The patient expressed understanding and agreed to proceed. This visit was facilitated by patient's bedside nurse Kim using cell phone for FaceTime for video and audio History of Present Illness:  Patient remains poorly responsive and can follow daily simple midline commands as per bedside RN.  Continues to have dense left hemiplegia but can move the right side purposefully.  He barely speaks few words and has not been able to swallow.  Repeat CT scan of the head done this morning shows evolving right MCA infarct with cytotoxic edema and slight midline shift.   Observations/Objective: Neurological exam is limited due to constraints of virtual video visit.  Patient is drowsy but can be aroused with tactile stimuli.  He has some right gaze preference and will not follow gaze in all direction but this is only partially.  He does not blink to threat on either side.  He barely speaks in a few words and asked to do so cannot speak sentences.  He has left facial weakness.  Tongue midline.  Motor system exam shows dense left hemiplegia with not with driving to noxious stimuli.  He has purposeful antigravity movement on the right side without weakness.  Sensation appears to be diminished on the left compared to the right.  Gait not tested.  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  Patient has poor neurological baseline to begin with and given his last  stroke is unlikely to do well and in order to survive is likely going to need feeding tube and prolonged nursing home placement. Plan I had a long discussion with the patient's daughter whom I spoke to over the phone and explained his prognosis and she agreed to DNR and was leaning towards comfort care but would like to discuss this with rest of the family before she would get back to patient's attending doctor later this afternoon.  I agree with continuing present treatment and changing over to comfort care when family makes a decision.  Patient is unable to swallow if medical care is to be continued may need a feeding tube and starting IV heparin as he will not be able to swallow Eliquis. Discussed with Dr. Bonner Puna medical hospitalist and patient bedside RN and patient's daughter. Greater than 50% time during this 35-minute visit was spent on counseling and coordination of care and discussion with primary team and daughter answering questions   I   Vitals:   03/01/19 1932 03/02/19 0024 03/02/19 0353 03/02/19 0748  BP: (!) 132/52 (!) 166/91 (!) 159/98 (!) 163/89  Pulse: 66 95 90   Resp: 20 (!) 23 20   Temp: 97.6 F (36.4 C) 97.6 F (36.4 C) (!) 97.4 F (36.3 C) 98.4 F (36.9 C)  TempSrc: Axillary Axillary Axillary Axillary  SpO2: 92% 92% 91%   Weight:      Height:        CBC:  Recent Labs  Lab 03/01/19 0524 03/02/19 0356  WBC 19.5* 13.5*  NEUTROABS  17.9* 12.2*  HGB 13.4 13.3  HCT 42.1 43.1  MCV 81.1 82.7  PLT 285 [id number]    Basic Metabolic Panel:  Recent Labs  Lab 03/01/19 0524 03/02/19 0356  NA 142 145  K 3.8 4.2  CL 107 108  CO2 22 23  GLUCOSE 112* 125*  BUN 53* 56*  CREATININE 1.11 1.30*  CALCIUM 9.2 9.1  MG 2.3 2.6*  PHOS 3.5 4.7*   Lipid Panel:     Component Value Date/Time   CHOL 63 03/02/2019 0356   CHOL 184 11/05/2017 1353   TRIG 27 03/02/2019 0356   HDL 34 (L) 03/02/2019 0356   HDL 71 11/05/2017 1353   CHOLHDL 1.9 03/02/2019 0356   VLDL 5 03/02/2019  0356   LDLCALC 24 03/02/2019 0356   LDLCALC 100 (H) 11/05/2017 1353   HgbA1c:  Lab Results  Component Value Date   HGBA1C 6.5 (H) 03/02/2019    Urine Drug Screen: No results found for: LABOPIA, COCAINSCRNUR, LABBENZ, AMPHETMU, THCU, LABBARB  Alcohol Level No results found for: [facility]  IMAGING past 48 hours CT Code Stroke CTA Head W/WO contrast  Result Date: 03/01/2019 CLINICAL DATA:  Right frontal stroke. EXAM: CT ANGIOGRAPHY HEAD AND NECK TECHNIQUE: Multidetector CT imaging of the head and neck was performed using the standard protocol during bolus administration of intravenous contrast. Multiplanar CT image reconstructions and MIPs were obtained to evaluate the vascular anatomy. Carotid stenosis measurements (when applicable) are obtained utilizing NASCET criteria, using the distal internal carotid diameter as the denominator. CONTRAST:  87mL OMNIPAQUE IOHEXOL 350 MG/ML SOLN COMPARISON:  Head CT same day FINDINGS: CTA NECK FINDINGS Aortic arch: Aortic atherosclerosis. Branching pattern is normal without flow limiting origin stenosis. Right carotid system: Common carotid artery shows some scattered atherosclerotic plaque but is widely patent to the bifurcation. There is soft and calcified plaque at the carotid bifurcation and ICA bulb. There is occlusion of the ICA in the bulb, without reconstitution in the cervical region. Left carotid system: Common carotid artery shows some scattered plaque but is widely patent to the bifurcation. Minimal plaque at the ICA bulb but no stenosis. Cervical ICA is widely patent. Vertebral arteries: Both vertebral artery origins show calcified plaque with stenosis estimated at 30-50% on each side. Beyond that, the vertebral arteries are widely patent through the cervical region to the foramen magnum. Skeleton: Ordinary cervical spondylosis. Other neck: No mass or lymphadenopathy. Upper chest: Emphysema. Pleural and parenchymal scarring at the apices. Review of the MIP  images confirms the above findings CTA HEAD FINDINGS Anterior circulation: Left internal carotid artery is widely patent through the skull base and siphon region. Left internal carotid artery is patent through the skull base and siphon region. There is atherosclerotic calcification throughout the left carotid siphon with stenosis estimated at 30%. The left anterior and middle cerebral vessels are patent without large or medium vessel occlusion. The right internal carotid artery is occluded at the bifurcation and through the skull base. There is reconstitution in the distal siphon and supraclinoid region presumably from external to internal collaterals and cross flow from a patent anterior and posterior communicating arteries. Small intraluminal filling defect in the supraclinoid ICA. The anterior and middle cerebral vessels show flow. I do not see a large or medium vessel occlusion. Posterior circulation: Both vertebral arteries are patent through the foramen magnum. There is atherosclerotic calcification in the vertebral V4 segments but no significant stenosis. Basilar artery is widely patent. Posterior circulation branch vessels appear normal. Venous sinuses: Patent  and normal. Anatomic variants: None significant. Review of the MIP images confirms the above findings IMPRESSION: 1. Occlusion of the right ICA at the bifurcation and through the skull base. Reconstitution in the distal right siphon and supraclinoid region presumably from external to internal collaterals and cross flow from a patent anterior and posterior communicating arteries. Small filling defect in the right supraclinoid ICA. No definable intracranial anterior circulation large or medium vessel occlusion. Presumed embolic occlusion of a smaller right frontal MCA branch. 2. Atherosclerotic disease in the left carotid siphon with stenosis estimated at 30%. 3. Atherosclerotic disease at both vertebral artery origins with stenosis estimated at 30-50%  on each side. Electronically Signed   By: Nelson Chimes M.D.   On: 03/01/2019 11:40   CT Code Stroke CTA Neck W/WO contrast  Result Date: 03/01/2019 CLINICAL DATA:  Right frontal stroke. EXAM: CT ANGIOGRAPHY HEAD AND NECK TECHNIQUE: Multidetector CT imaging of the head and neck was performed using the standard protocol during bolus administration of intravenous contrast. Multiplanar CT image reconstructions and MIPs were obtained to evaluate the vascular anatomy. Carotid stenosis measurements (when applicable) are obtained utilizing NASCET criteria, using the distal internal carotid diameter as the denominator. CONTRAST:  38mL OMNIPAQUE IOHEXOL 350 MG/ML SOLN COMPARISON:  Head CT same day FINDINGS: CTA NECK FINDINGS Aortic arch: Aortic atherosclerosis. Branching pattern is normal without flow limiting origin stenosis. Right carotid system: Common carotid artery shows some scattered atherosclerotic plaque but is widely patent to the bifurcation. There is soft and calcified plaque at the carotid bifurcation and ICA bulb. There is occlusion of the ICA in the bulb, without reconstitution in the cervical region. Left carotid system: Common carotid artery shows some scattered plaque but is widely patent to the bifurcation. Minimal plaque at the ICA bulb but no stenosis. Cervical ICA is widely patent. Vertebral arteries: Both vertebral artery origins show calcified plaque with stenosis estimated at 30-50% on each side. Beyond that, the vertebral arteries are widely patent through the cervical region to the foramen magnum. Skeleton: Ordinary cervical spondylosis. Other neck: No mass or lymphadenopathy. Upper chest: Emphysema. Pleural and parenchymal scarring at the apices. Review of the MIP images confirms the above findings CTA HEAD FINDINGS Anterior circulation: Left internal carotid artery is widely patent through the skull base and siphon region. Left internal carotid artery is patent through the skull base and  siphon region. There is atherosclerotic calcification throughout the left carotid siphon with stenosis estimated at 30%. The left anterior and middle cerebral vessels are patent without large or medium vessel occlusion. The right internal carotid artery is occluded at the bifurcation and through the skull base. There is reconstitution in the distal siphon and supraclinoid region presumably from external to internal collaterals and cross flow from a patent anterior and posterior communicating arteries. Small intraluminal filling defect in the supraclinoid ICA. The anterior and middle cerebral vessels show flow. I do not see a large or medium vessel occlusion. Posterior circulation: Both vertebral arteries are patent through the foramen magnum. There is atherosclerotic calcification in the vertebral V4 segments but no significant stenosis. Basilar artery is widely patent. Posterior circulation branch vessels appear normal. Venous sinuses: Patent and normal. Anatomic variants: None significant. Review of the MIP images confirms the above findings IMPRESSION: 1. Occlusion of the right ICA at the bifurcation and through the skull base. Reconstitution in the distal right siphon and supraclinoid region presumably from external to internal collaterals and cross flow from a patent anterior and posterior communicating arteries.  Small filling defect in the right supraclinoid ICA. No definable intracranial anterior circulation large or medium vessel occlusion. Presumed embolic occlusion of a smaller right frontal MCA branch. 2. Atherosclerotic disease in the left carotid siphon with stenosis estimated at 30%. 3. Atherosclerotic disease at both vertebral artery origins with stenosis estimated at 30-50% on each side. Electronically Signed   By: Nelson Chimes M.D.   On: 03/01/2019 11:40   DG CHEST PORT 1 VIEW  Result Date: 03/01/2019 CLINICAL DATA:  COVID pneumonia. Hypoxia. EXAM: PORTABLE CHEST 1 VIEW COMPARISON:  02/26/2019  FINDINGS: Worsening lung aeration with progressive bilateral interstitial and airspace infiltrates consistent with COVID pneumonia. No pleural effusions. No pneumothorax. IMPRESSION: Worsening lung aeration with progressive bilateral infiltrates. Electronically Signed   By: Marijo Sanes M.D.   On: 03/01/2019 10:02   CT HEAD CODE STROKE WO CONTRAST`  Result Date: 03/01/2019 CLINICAL DATA:  Code stroke. Neuro deficit, acute, stroke suspected. Additional history provided: Stroke, last known normal 0800 EXAM: CT HEAD WITHOUT CONTRAST TECHNIQUE: Contiguous axial images were obtained from the base of the skull through the vertex without intravenous contrast. COMPARISON:  Head CT 07/03/2018, brain MRI 04/05/2018 FINDINGS: Brain: Mildly motion degraded examination. No evidence of acute intracranial hemorrhage. There is an acute cortical/subcortical infarct within the anterior right frontal lobe (series 2, image 17). Redemonstrated chronic cortical/subcortical infarct within the left superior frontal gyrus. No evidence of intracranial mass. No midline shift or extra-axial fluid collection. Stable background generalized parenchymal atrophy and chronic small vessel ischemic disease. Redemonstrated chronic lacunar infarct within the right caudate nucleus. Vascular: No definite hyperdense vessel. Skull: Normal. Negative for fracture or focal lesion. Sinuses/Orbits: Visualized orbits demonstrate no acute abnormality. ASPECTS ([place] Stroke Program Early CT Score) - Ganglionic level infarction (caudate, lentiform nuclei, internal capsule, insula, M1-M3 cortex): 7 - Supraganglionic infarction (M4-M6 cortex): 2 (point subtracted for acute infarct in the right M5 territory) Total score (0-10 with 10 being normal): 9 These results were called by telephone at the time of interpretation on 03/01/2019 at 11:03 am to provider Dr. Leonel Ramsay, who verbally acknowledged these results. IMPRESSION: 1. Small acute cortical/subcortical  infarct within the anterior right frontal lobe. ASPECTS 9. No hemorrhagic conversion or significant mass effect. 2. Redemonstrated chronic cortically based infarct within the left superior frontal gyrus. 3. Generalized parenchymal atrophy and chronic small vessel ischemic disease. Redemonstrated chronic lacunar infarct within the right caudate nucleus Electronically Signed   By: Kellie Simmering DO   On: 03/01/2019 11:07       Recent Labs    03/01/19 2013 03/02/19 0020 03/02/19 0351  GLUCAP 149* 140* 115*   Follow Up Instructions: Stroke team will be available as needed when court.   I discussed the assessment and treatment plan with the patient. The patient was provided an opportunity to ask questions and all were answered. The patient agreed with the plan and demonstrated an understanding of the instructions.   The patient was advised to call back or seek an in-person evaluation if the symptoms worsen or if the condition fails to improve as anticipated.  I provided 35 minutes of non-face-to-face time during this encounter.   Antony Contras, MD   To contact Stroke Continuity provider, please refer to [email or web address] After hours, contact General Neurology

## 2019-03-02 NOTE — Plan of Care (Signed)
Pt is on RA, tolerated during the night. O2 sats above 90%. No new neuro changes noted, still with left sided weakness and unable to verbalize needs. Attempts to talk but just comes out as unintelligible sounds, does answer questions appropriately by nodding head. Safety precautions in place. Frequent rounding.  Problem: Education: Goal: Knowledge of General Education information will improve Description: Including pain rating scale, medication(s)/side effects and non-pharmacologic comfort measures Outcome: Progressing   Problem: Health Behavior/Discharge Planning: Goal: Ability to manage health-related needs will improve Outcome: Progressing   Problem: Clinical Measurements: Goal: Ability to maintain clinical measurements within normal limits will improve Outcome: Progressing Goal: Will remain free from infection Outcome: Progressing Goal: Diagnostic test results will improve Outcome: Progressing Goal: Respiratory complications will improve Outcome: Progressing Goal: Cardiovascular complication will be avoided Outcome: Progressing   Problem: Activity: Goal: Risk for activity intolerance will decrease Outcome: Progressing   Problem: Nutrition: Goal: Adequate nutrition will be maintained Outcome: Progressing   Problem: Elimination: Goal: Will not experience complications related to bowel motility Outcome: Progressing Goal: Will not experience complications related to urinary retention Outcome: Progressing   Problem: Pain Managment: Goal: General experience of comfort will improve Outcome: Progressing   Problem: Safety: Goal: Ability to remain free from injury will improve Outcome: Progressing   Problem: Skin Integrity: Goal: Risk for impaired skin integrity will decrease Outcome: Progressing

## 2019-03-02 NOTE — Progress Notes (Signed)
  Speech Language Pathology Treatment: Dysphagia  Patient Details Name: Dave Harris MRN: [id number] DOB: 04-Dec-1929 Today's Date: 03/02/2019 Time: 1350-1406 SLP Time Calculation (min) (ACUTE ONLY): 16 min  Assessment / Plan / Recommendation Clinical Impression  Unfortunately pt has had an acute stroke since ST assessed swallow function on 1/17 where puree and nectar thick liquids were recommended. He took cup from therapist and self fed resulting in obvious signs of aspiration with nectar thick. Immediate strong coughs for 1-2 minutes. With encouragement consumed 2 bites pudding with lingual residue and prolonged transit. SpO2 reading not accurate to assess O2 saturation. He is tired, deconditioned and prognosis for swallow is fair-poor at best. MD to discuss goals of care with family this afternoon. Most realistic option would be comfort feeds. Given significant sensation to suspected aspiration with nectar, the usual thin liquid for comfort care may cause excess stress and discomfort- allowing bites of puree and honey thick liquid if pt desires may be more desirable. Will follow briefly for decision.    HPI HPI: Dave Harris is a 86 y.o. male with medical history significant of CVA, CAD, Atrial Fibrillation on Apixaban, Carotid artery stenosis, HTN, HLD, BPH and Depression, transferred from [facility] ED with hypoxemic respiratory failure, transfer to [facility].  BSE 02/26/18 recommending nectar thick and puree. Unfortunately pt demonstrated acute stroke symptoms and imaging confirmed acute CVA > npo with ST to provide diagnostic assessment for swallow ability.        SLP Plan  Continue with current plan of care       Recommendations  Diet recommendations: NPO(or comfort feeds)                Oral Care Recommendations: Oral care BID Follow up Recommendations: 24 hour supervision/assistance SLP Visit Diagnosis: Dysphagia, oropharyngeal phase (R13.12) Plan: Continue  with current plan of care       GO                Houston Siren 03/02/2019, 2:23 PM  [phone]

## 2019-03-02 NOTE — Progress Notes (Signed)
Occupational Therapy Evaluation Patient Details Name: Dave Harris MRN: [id number] DOB: Nov 04, 1929 Today's Date: 03/02/2019    History of Present Illness 86 year old male admitted with COVID; PMH includes CAD, CABG, Chronic AFib with multiple CVAs, carod artery stenosis, HTN.He'd been improving from a respiratory status and was on RA on 1/18, but on 1/19 he was noted to have L sided weakness and neglect concerning for stroke as well as worsening encephalopathy and hypoxia. CT 1/20--.Expected evolutionary change of the patient's right frontal infarct.   Clinical Impression   Patient in bed on arrival.  He was on room air and SpO2 was 91% at rest.  He is hard of hearing and may have potential visual deficits secondary to stroke.  He was unable to speak but attempted to a few times, words were inaudible.  Appears to have L side neglect.  He inconsistently tracked with eyes/head slightly left of midline.  L UE was flaccid with no tone. Patient unable to maintain seated balance and required total assist with mobility.  His SpO2 dropped to low 80's while sitting EOB.  Put him on O2 and had to increase to 10L HFNC for improvement in SpO2 back to 90.  Informed RN of the oxygen change.  Will continue to follow with OT acutely to work on improving deficits listed below.     Follow Up Recommendations  SNF    Equipment Recommendations       Recommendations for Other Services       Precautions / Restrictions Precautions Precautions: Fall Precaution Comments: desats      Mobility Bed Mobility Overal bed mobility: Needs Assistance Bed Mobility: Rolling;Sidelying to Sit;Sit to Sidelying Rolling: Total assist;+2 for physical assistance;+2 for safety/equipment Sidelying to sit: +2 for safety/equipment;+2 for physical assistance;Total assist     Sit to sidelying: Total assist;+2 for physical assistance;+2 for safety/equipment General bed mobility comments: patient does not follow gestures  or verbal. Assisted to roll and sit up from right sidelying. Patient di not assist in any way.. Returned to right side , to supine with total assist and [place] of bed pad.  Transfers                 General transfer comment: NT    Balance Overall balance assessment: Needs assistance Sitting-balance support: Single extremity supported;Feet supported Sitting balance-Leahy Scale: Zero Sitting balance - Comments: sits at midline with  assist, not pushing, does hold bed rail. will loose balance if not supported. At times, noted to attempt  trunk control .                                   ADL either performed or assessed with clinical judgement   ADL Overall ADL's : Needs assistance/impaired Eating/Feeding: Total Assist   Grooming: Wash/dry face;Total assistance;Sitting   Upper Body Bathing: Total assistance   Lower Body Bathing: Total assistance   Upper Body Dressing : Total assistance   Lower Body Dressing: Total assistance   Toilet Transfer: Total assistance;+2 for physical assistance   Toileting- Clothing Manipulation and Hygiene: Total assistance;+2 for physical assistance    General ADL comments: Can move right UE but is weak.  He can complete hand to mouth with max assist.           Vision Patient Visual Report: Other (comment)(L neglect, potential L field of vision loss) Vision Assessment?: Vision impaired- to be further tested in functional  context     Perception     Praxis      Pertinent Vitals/Pain Pain Assessment: Faces Faces Pain Scale: Hurts a little bit Pain Intervention(s): Monitored during session     Hand Dominance     Extremity/Trunk Assessment Upper Extremity Assessment Upper Extremity Assessment: LUE deficits/detail LUE Deficits / Details: L hemiplegia LUE Sensation: decreased light touch LUE Coordination: decreased gross motor;decreased fine motor   Lower Extremity Assessment Lower Extremity Assessment: RLE  deficits/detail;LLE deficits/detail RLE Deficits / Details: moves spontaneously to right bed edge LLE Deficits / Details: increased flexor withdrawal then touched foot, upgoing toes, increased tone to PROM at hip and knee   Cervical / Trunk Assessment Cervical / Trunk Assessment: Kyphotic   Communication Communication Communication: Expressive difficulties;Receptive difficulties;HOH   Cognition Arousal/Alertness: Awake/alert   Overall Cognitive Status: Difficult to assess                                     General Comments       Exercises     Shoulder Instructions      Home Living Family/patient expects to be discharged to:: Skilled nursing facility                                 Additional Comments: per chart, was ambulatory at facility      Prior Functioning/Environment Level of Independence: Needs assistance        Comments: uncertain, resides at ALF        OT Problem List: Decreased strength;Decreased range of motion;Decreased activity tolerance;Impaired balance (sitting and/or standing);Impaired vision/perception;Decreased coordination;Decreased safety awareness;Cardiopulmonary status limiting activity;Impaired sensation;Impaired tone;Impaired UE functional use      OT Treatment/Interventions: Self-care/ADL training;Therapeutic exercise;Energy conservation;Neuromuscular education;Splinting;Therapeutic activities;Visual/perceptual remediation/compensation;Balance training    OT Goals(Current goals can be found in the care plan section) Acute Rehab OT Goals Patient Stated Goal: pt unable OT Goal Formulation: Patient unable to participate in goal setting Time For Goal Achievement: 03/16/19  OT Frequency: Min 2X/week   Barriers to D/C:            Co-evaluation              AM-PAC OT "6 Clicks" Daily Activity     Outcome Measure Help from another person eating meals?: Total Help from another person taking care of  personal grooming?: Total Help from another person toileting, which includes using toliet, bedpan, or urinal?: Total Help from another person bathing (including washing, rinsing, drying)?: Total Help from another person to put on and taking off regular upper body clothing?: Total Help from another person to put on and taking off regular lower body clothing?: Total 6 Click Score: 6   End of Session Equipment Utilized During Treatment: Oxygen Nurse Communication: Mobility status  Activity Tolerance: Patient limited by lethargy;Patient limited by fatigue Patient left: in bed;with bed alarm set;with call bell/phone within reach  OT Visit Diagnosis: Unsteadiness on feet (R26.81);Muscle weakness (generalized) (M62.81);Feeding difficulties (R63.3);Other symptoms and signs involving the nervous system (R29.898);Hemiplegia and hemiparesis Hemiplegia - Right/Left: Left                Time: [id number] OT Time Calculation (min): 35 min Charges:  OT General Charges $OT Visit: 1 Visit OT Evaluation $OT Eval Moderate Complexity: 1 [address] OTR/L   Phylliss Bob 03/02/2019, 2:27 PM

## 2019-03-03 LAB — CBC WITH DIFFERENTIAL/PLATELET
Abs Immature Granulocytes: 0.1 10*3/uL — ABNORMAL HIGH (ref 0.00–0.07)
Basophils Absolute: 0 10*3/uL (ref 0.0–0.1)
Basophils Relative: 0 %
Eosinophils Absolute: 0 10*3/uL (ref 0.0–0.5)
Eosinophils Relative: 0 %
HCT: 44.2 % (ref 39.0–52.0)
Hemoglobin: 13.9 g/dL (ref 13.0–17.0)
Immature Granulocytes: 1 %
Lymphocytes Relative: 5 %
Lymphs Abs: 0.7 10*3/uL (ref 0.7–4.0)
MCH: 25.9 pg — ABNORMAL LOW (ref 26.0–34.0)
MCHC: 31.4 g/dL (ref 30.0–36.0)
MCV: 82.5 fL (ref 80.0–100.0)
Monocytes Absolute: 0.7 10*3/uL (ref 0.1–1.0)
Monocytes Relative: 5 %
Neutro Abs: 13.1 10*3/uL — ABNORMAL HIGH (ref 1.7–7.7)
Neutrophils Relative %: 89 %
Platelets: 245 10*3/uL (ref 150–400)
RBC: 5.36 MIL/uL (ref 4.22–5.81)
RDW: 17.9 % — ABNORMAL HIGH (ref 11.5–15.5)
WBC: 14.6 10*3/uL — ABNORMAL HIGH (ref 4.0–10.5)
nRBC: 0 % (ref 0.0–0.2)

## 2019-03-03 LAB — COMPREHENSIVE METABOLIC PANEL
ALT: 93 U/L — ABNORMAL HIGH (ref 0–44)
AST: 90 U/L — ABNORMAL HIGH (ref 15–41)
Albumin: 2.4 g/dL — ABNORMAL LOW (ref 3.5–5.0)
Alkaline Phosphatase: 158 U/L — ABNORMAL HIGH (ref 38–126)
Anion gap: 16 — ABNORMAL HIGH (ref 5–15)
BUN: 62 mg/dL — ABNORMAL HIGH (ref 8–23)
CO2: 23 mmol/L (ref 22–32)
Calcium: 9.2 mg/dL (ref 8.9–10.3)
Chloride: 111 mmol/L (ref 98–111)
Creatinine, Ser: 0.97 mg/dL (ref 0.61–1.24)
GFR calc Af Amer: >60 mL/min (ref >60)
GFR calc non Af Amer: >60 mL/min (ref >60)
Glucose, Bld: 117 mg/dL — ABNORMAL HIGH (ref 70–99)
Potassium: 4.4 mmol/L (ref 3.5–5.1)
Sodium: 150 mmol/L — ABNORMAL HIGH (ref 135–145)
Total Bilirubin: 1.1 mg/dL (ref 0.3–1.2)
Total Protein: 7.2 g/dL (ref 6.5–8.1)

## 2019-03-03 LAB — PHOSPHORUS: Phosphorus: 4.2 mg/dL (ref 2.5–4.6)

## 2019-03-03 LAB — D-DIMER, QUANTITATIVE: D-Dimer, Quant: 14.52 ug/mL-FEU — ABNORMAL HIGH (ref 0.00–0.50)

## 2019-03-03 LAB — C-REACTIVE PROTEIN: CRP: 14.3 mg/dL — ABNORMAL HIGH (ref <1.0)

## 2019-03-03 LAB — MAGNESIUM: Magnesium: 2.9 mg/dL — ABNORMAL HIGH (ref 1.7–2.4)

## 2019-03-03 LAB — FERRITIN: Ferritin: 160 ng/mL (ref 24–336)

## 2019-03-03 MED ORDER — ONDANSETRON 4 MG PO TBDP: 4.0000 mg | ORAL_TABLET | Freq: Four times a day (QID) | ORAL | Status: DC | PRN

## 2019-03-03 MED ORDER — ACETAMINOPHEN 650 MG RE SUPP: 650.0000 mg | Freq: Four times a day (QID) | RECTAL | Status: DC | PRN

## 2019-03-03 MED ORDER — HALOPERIDOL 0.5 MG PO TABS
0.5000 mg | ORAL_TABLET | ORAL | Status: DC | PRN
  Filled 2019-03-03: qty 1

## 2019-03-03 MED ORDER — ACETAMINOPHEN 325 MG PO TABS: 650.0000 mg | ORAL_TABLET | Freq: Four times a day (QID) | ORAL | Status: DC | PRN

## 2019-03-03 MED ORDER — GLYCOPYRROLATE 0.2 MG/ML IJ SOLN: 0.2000 mg | INTRAMUSCULAR | Status: DC | PRN

## 2019-03-03 MED ORDER — LORAZEPAM 2 MG/ML IJ SOLN: 1.0000 mg | INTRAMUSCULAR | Status: DC | PRN

## 2019-03-03 MED ORDER — HYDROMORPHONE HCL 1 MG/ML IJ SOLN
0.5000 mg | INTRAMUSCULAR | Status: DC | PRN
  Administered 2019-03-03: 16:00:00 0.5 mg via INTRAVENOUS
  Filled 2019-03-03: qty 1

## 2019-03-03 MED ORDER — GLYCOPYRROLATE 1 MG PO TABS
1.0000 mg | ORAL_TABLET | ORAL | Status: DC | PRN
  Filled 2019-03-03: qty 1

## 2019-03-03 MED ORDER — IPRATROPIUM-ALBUTEROL 20-100 MCG/ACT IN AERS: 1.0000 | INHALATION_SPRAY | Freq: Four times a day (QID) | RESPIRATORY_TRACT | Status: DC | PRN

## 2019-03-03 MED ORDER — HALOPERIDOL LACTATE 2 MG/ML PO CONC
0.5000 mg | ORAL | Status: DC | PRN
  Filled 2019-03-03: qty 0.3

## 2019-03-03 MED ORDER — MIRTAZAPINE 15 MG PO TABS: 30.0000 mg | ORAL_TABLET | Freq: Every evening | ORAL | Status: DC | PRN

## 2019-03-03 MED ORDER — POLYVINYL ALCOHOL 1.4 % OP SOLN
1.0000 [drp] | Freq: Four times a day (QID) | OPHTHALMIC | Status: DC | PRN
  Filled 2019-03-03: qty 15

## 2019-03-03 MED ORDER — HALOPERIDOL LACTATE 5 MG/ML IJ SOLN: 0.5000 mg | INTRAMUSCULAR | Status: DC | PRN

## 2019-03-03 MED ORDER — IPRATROPIUM-ALBUTEROL 20-100 MCG/ACT IN AERS: 1.0000 | INHALATION_SPRAY | Freq: Four times a day (QID) | RESPIRATORY_TRACT | Status: AC | PRN

## 2019-03-03 MED ORDER — LORAZEPAM 1 MG PO TABS: 1.0000 mg | ORAL_TABLET | ORAL | Status: DC | PRN

## 2019-03-03 MED ORDER — LORAZEPAM 2 MG/ML PO CONC
1.0000 mg | ORAL | Status: DC | PRN
  Filled 2019-03-03: qty 0.5

## 2019-03-03 MED ORDER — BIOTENE DRY MOUTH MT LIQD: 15.0000 mL | OROMUCOSAL | Status: DC | PRN

## 2019-03-03 MED ORDER — ONDANSETRON HCL 4 MG/2ML IJ SOLN: 4.0000 mg | Freq: Four times a day (QID) | INTRAMUSCULAR | Status: DC | PRN

## 2019-03-03 NOTE — Progress Notes (Signed)
Pt arrived to rm 323. Sleeping, responds to pain. Skin assessed with RN and observed bruising on all extremities, no open areas. Left sided flaccidity in upper and lower extremities. Replaced condom cath, V/S stable. IV placed in arm. Per RN transferring, family aware of room change at this time. No issues or concerns to report at the moment. Will cont to monitor.

## 2019-03-03 NOTE — Progress Notes (Signed)
Patient transferred to [facility] via stretcher in the company of EMS.  Patient alert and responsive with no distress noted prior to leaving the floor.

## 2019-03-03 NOTE — Discharge Summary (Signed)
Physician Discharge Summary  Dave Harris [id number] DOB: 06/21/1929 DOA: 02/26/2019  PCP: Garwin Brothers, MD  Admit date: 02/26/2019 Discharge date: 03/03/2019  Admitted From: Home Disposition: Residential Hospice   Recommendations for Outpatient Follow-up:  1. Comfort measures  Discharge Condition: Stable for transport.  CODE STATUS: DNR  Brief/Interim Summary: Dave Harris an 86 y.o.malewith medical history significant ofCVA, CAD, Atrial Fibrillation on apixaban, carotid artery stenosis, HTN, HLD,BPH and depression, who initially presented from his ALF to [facility] ED with hypoxemic respiratory failure with initial oxygen sats of 60s.Patient was placed on NRB, given Remdesivir, Dexamethasone as well as antibiotics prior to transfer to [facility] on 1/16.He has significant hearing impairment which limits communication.  He'd been improving from a respiratory status and was on room air on 1/18, but on 1/19 he was noted to have L sided weakness and neglect concerning for stroke as well as worsening encephalopathy and hypoxia. Head CT confirmed left frontal CVA. Neurology was consulted and repeat head CT 1/20 confirms expected evolutionary changes/cytotoxic edema. Speech therapy evaluation has demonstrated significant aspiration risk, feeding tube recommended. The patient and family have consistently declared that they do not want a feeding tube and the the goals of care should be transitioned purely to comfort measures. Residential hospice placement is being pursued, palliative care consulted, and comfort feeding is permitted.   Discharge Diagnoses:  Active Problems:   Pneumonia due to COVID-19 virus   Cerebral embolism with cerebral infarction  Acute hypoxemic respiratory failure due to covid-19 pneumonia and superimposed bacterial pneumonia: Continues to have significant exertional hypoxia.  - Completed remdesivir x5 days   - Stop antibiotics, steroids given  comfort measures. Stop checking labs.  - Tylenol and antitussives prn - Isolation period would be recommended for 21 days from positive testing (which was presumably 1/16).  - No further labs/radiography planned.  Right frontal CVA in patient with history of carotid stenosis and CVA 2017 (L MCA s/p left CEA):  - Repeat CT head showed expected evolutionary changes. Exam findings consistent with this infarct. - Goals of care transitioned to comfort measures, will DC all anticoagulation, po medications.  Acute metabolic encephalopathy on chronic dementia:  - Delirium precautions. Exacerbated by new neurological deficits and extreme HOH, though responses to questions appear to suggest he is making consistent medical decisions for himself. These wishes were confirmed by his daughter and with his daughter.   HTN, HFpEF, CAD with type II NSTEMI: Echocardiogram showed inferior hypokinesis. No STEMI on ECG nor chest pain reported.    - No further interventions planned. DC monitoring.  NSVT:  - No further interventions planned. DC monitoring.  GERD:  - No po medications  Depression:  - Continue remeron if desired for insomnia.  BPH:  - Monitor UOP, cath prn.  Prediabetes with steroid-induced hyperglycemia:  - No further interventions planned. DC monitoring.  Discharge Instructions  Allergies as of 03/03/2019      Reactions   Plavix [clopidogrel Bisulfate] Other (See Comments)   PT STATES MED DID NOT MAKE HIM FEEL WELL   Xarelto [rivaroxaban] Other (See Comments)   bleeding      Medication List    STOP taking these medications   acetaminophen 325 MG tablet Commonly known as: TYLENOL   acetylcysteine 10 % nebulizer solution Commonly known as: MUCOMYST   anti-nausea solution   aspirin 81 MG tablet   atorvastatin 40 MG tablet Commonly known as: LIPITOR   bethanechol 25 MG tablet Commonly known as: URECHOLINE  busPIRone 5 MG tablet Commonly known as: BUSPAR    carvedilol 6.25 MG tablet Commonly known as: COREG   docusate sodium 100 MG capsule Commonly known as: COLACE   Eliquis 5 MG Tabs tablet Generic drug: apixaban   fexofenadine 180 MG tablet Commonly known as: ALLEGRA   finasteride 5 MG tablet Commonly known as: PROSCAR   fluticasone 44 MCG/ACT inhaler Commonly known as: FLOVENT HFA   furosemide 20 MG tablet Commonly known as: LASIX   guaiFENesin 100 MG/5ML Soln Commonly known as: ROBITUSSIN   HYDROcodone-acetaminophen 5-325 MG tablet Commonly known as: NORCO/VICODIN   Imodium A-D 2 MG capsule Generic drug: loperamide   ipratropium-albuterol 0.5-2.5 (3) MG/3ML Soln Commonly known as: DUONEB Replaced by: Ipratropium-Albuterol 20-100 MCG/ACT Aers respimat   meclizine 25 MG tablet Commonly known as: ANTIVERT   mirtazapine 30 MG tablet Commonly known as: REMERON   Movantik 12.5 MG Tabs tablet Generic drug: naloxegol oxalate   nitroGLYCERIN 0.4 MG SL tablet Commonly known as: NITROSTAT   ondansetron 4 MG tablet Commonly known as: ZOFRAN   pantoprazole 40 MG tablet Commonly known as: PROTONIX   phenol 1.4 % Liqd Commonly known as: CHLORASEPTIC   PRESERVISION AREDS PO   Senna-Plus 8.6-50 MG tablet Generic drug: senna-docusate   tamsulosin 0.4 MG Caps capsule Commonly known as: FLOMAX   vitamin B-12 1000 MCG tablet Commonly known as: CYANOCOBALAMIN   Vitamin D3 50 MCG (2000 UT) Tabs     TAKE these medications   Breo Ellipta 100-25 MCG/INH Aepb Generic drug: fluticasone furoate-vilanterol Take 1 puff by mouth daily.   Ipratropium-Albuterol 20-100 MCG/ACT Aers respimat Commonly known as: COMBIVENT Inhale 1 puff into the lungs every 6 (six) hours as needed for wheezing. Replaces: ipratropium-albuterol 0.5-2.5 (3) MG/3ML Soln   Robitussin DM 10-100 MG/5ML liquid Generic drug: Dextromethorphan-guaiFENesin Take 5 mLs by mouth every 4 (four) hours as needed (dry cough). May use up to 24hr        Allergies  Allergen Reactions  . Plavix [Clopidogrel Bisulfate] Other (See Comments)    PT STATES MED DID NOT MAKE HIM FEEL WELL  . Xarelto [Rivaroxaban] Other (See Comments)    bleeding    Consultations:  Neurology  Palliative care  Procedures/Studies: CT Code Stroke CTA Head W/WO contrast  Result Date: 03/01/2019 CLINICAL DATA:  Right frontal stroke. EXAM: CT ANGIOGRAPHY HEAD AND NECK TECHNIQUE: Multidetector CT imaging of the head and neck was performed using the standard protocol during bolus administration of intravenous contrast. Multiplanar CT image reconstructions and MIPs were obtained to evaluate the vascular anatomy. Carotid stenosis measurements (when applicable) are obtained utilizing NASCET criteria, using the distal internal carotid diameter as the denominator. CONTRAST:  33mL OMNIPAQUE IOHEXOL 350 MG/ML SOLN COMPARISON:  Head CT same day FINDINGS: CTA NECK FINDINGS Aortic arch: Aortic atherosclerosis. Branching pattern is normal without flow limiting origin stenosis. Right carotid system: Common carotid artery shows some scattered atherosclerotic plaque but is widely patent to the bifurcation. There is soft and calcified plaque at the carotid bifurcation and ICA bulb. There is occlusion of the ICA in the bulb, without reconstitution in the cervical region. Left carotid system: Common carotid artery shows some scattered plaque but is widely patent to the bifurcation. Minimal plaque at the ICA bulb but no stenosis. Cervical ICA is widely patent. Vertebral arteries: Both vertebral artery origins show calcified plaque with stenosis estimated at 30-50% on each side. Beyond that, the vertebral arteries are widely patent through the cervical region to the foramen magnum. Skeleton:  Ordinary cervical spondylosis. Other neck: No mass or lymphadenopathy. Upper chest: Emphysema. Pleural and parenchymal scarring at the apices. Review of the MIP images confirms the above findings CTA HEAD  FINDINGS Anterior circulation: Left internal carotid artery is widely patent through the skull base and siphon region. Left internal carotid artery is patent through the skull base and siphon region. There is atherosclerotic calcification throughout the left carotid siphon with stenosis estimated at 30%. The left anterior and middle cerebral vessels are patent without large or medium vessel occlusion. The right internal carotid artery is occluded at the bifurcation and through the skull base. There is reconstitution in the distal siphon and supraclinoid region presumably from external to internal collaterals and cross flow from a patent anterior and posterior communicating arteries. Small intraluminal filling defect in the supraclinoid ICA. The anterior and middle cerebral vessels show flow. I do not see a large or medium vessel occlusion. Posterior circulation: Both vertebral arteries are patent through the foramen magnum. There is atherosclerotic calcification in the vertebral V4 segments but no significant stenosis. Basilar artery is widely patent. Posterior circulation branch vessels appear normal. Venous sinuses: Patent and normal. Anatomic variants: None significant. Review of the MIP images confirms the above findings IMPRESSION: 1. Occlusion of the right ICA at the bifurcation and through the skull base. Reconstitution in the distal right siphon and supraclinoid region presumably from external to internal collaterals and cross flow from a patent anterior and posterior communicating arteries. Small filling defect in the right supraclinoid ICA. No definable intracranial anterior circulation large or medium vessel occlusion. Presumed embolic occlusion of a smaller right frontal MCA branch. 2. Atherosclerotic disease in the left carotid siphon with stenosis estimated at 30%. 3. Atherosclerotic disease at both vertebral artery origins with stenosis estimated at 30-50% on each side. Electronically Signed   By:  Nelson Chimes M.D.   On: 03/01/2019 11:40   CT HEAD WO CONTRAST  Result Date: 03/02/2019 CLINICAL DATA:  Patient status post right frontal lobe infarct. EXAM: CT HEAD WITHOUT CONTRAST TECHNIQUE: Contiguous axial images were obtained from the base of the skull through the vertex without intravenous contrast. COMPARISON:  Head CT scan 03/01/2019. FINDINGS: Brain: Patient motion degrades the examination. Edema in the right frontal lobe consistent with infarct is again seen. No hemorrhagic transformation. No midline shift, hydrocephalus or other new abnormality is identified. Atrophy, chronic microvascular ischemic change and remote left parafalcine infarct again seen. Vascular: Extensive atherosclerosis. Skull: Intact.  No focal lesion. Sinuses/Orbits: No acute abnormality.  Status post cataract surgery. Other: None. IMPRESSION: Expected evolutionary change of the patient's right frontal infarct. Negative for hemorrhagic transformation or other new abnormality. Electronically Signed   By: Inge Rise M.D.   On: 03/02/2019 11:27   CT Code Stroke CTA Neck W/WO contrast  Result Date: 03/01/2019 CLINICAL DATA:  Right frontal stroke. EXAM: CT ANGIOGRAPHY HEAD AND NECK TECHNIQUE: Multidetector CT imaging of the head and neck was performed using the standard protocol during bolus administration of intravenous contrast. Multiplanar CT image reconstructions and MIPs were obtained to evaluate the vascular anatomy. Carotid stenosis measurements (when applicable) are obtained utilizing NASCET criteria, using the distal internal carotid diameter as the denominator. CONTRAST:  60mL OMNIPAQUE IOHEXOL 350 MG/ML SOLN COMPARISON:  Head CT same day FINDINGS: CTA NECK FINDINGS Aortic arch: Aortic atherosclerosis. Branching pattern is normal without flow limiting origin stenosis. Right carotid system: Common carotid artery shows some scattered atherosclerotic plaque but is widely patent to the bifurcation. There is soft and  calcified plaque at the carotid bifurcation and ICA bulb. There is occlusion of the ICA in the bulb, without reconstitution in the cervical region. Left carotid system: Common carotid artery shows some scattered plaque but is widely patent to the bifurcation. Minimal plaque at the ICA bulb but no stenosis. Cervical ICA is widely patent. Vertebral arteries: Both vertebral artery origins show calcified plaque with stenosis estimated at 30-50% on each side. Beyond that, the vertebral arteries are widely patent through the cervical region to the foramen magnum. Skeleton: Ordinary cervical spondylosis. Other neck: No mass or lymphadenopathy. Upper chest: Emphysema. Pleural and parenchymal scarring at the apices. Review of the MIP images confirms the above findings CTA HEAD FINDINGS Anterior circulation: Left internal carotid artery is widely patent through the skull base and siphon region. Left internal carotid artery is patent through the skull base and siphon region. There is atherosclerotic calcification throughout the left carotid siphon with stenosis estimated at 30%. The left anterior and middle cerebral vessels are patent without large or medium vessel occlusion. The right internal carotid artery is occluded at the bifurcation and through the skull base. There is reconstitution in the distal siphon and supraclinoid region presumably from external to internal collaterals and cross flow from a patent anterior and posterior communicating arteries. Small intraluminal filling defect in the supraclinoid ICA. The anterior and middle cerebral vessels show flow. I do not see a large or medium vessel occlusion. Posterior circulation: Both vertebral arteries are patent through the foramen magnum. There is atherosclerotic calcification in the vertebral V4 segments but no significant stenosis. Basilar artery is widely patent. Posterior circulation branch vessels appear normal. Venous sinuses: Patent and normal. Anatomic  variants: None significant. Review of the MIP images confirms the above findings IMPRESSION: 1. Occlusion of the right ICA at the bifurcation and through the skull base. Reconstitution in the distal right siphon and supraclinoid region presumably from external to internal collaterals and cross flow from a patent anterior and posterior communicating arteries. Small filling defect in the right supraclinoid ICA. No definable intracranial anterior circulation large or medium vessel occlusion. Presumed embolic occlusion of a smaller right frontal MCA branch. 2. Atherosclerotic disease in the left carotid siphon with stenosis estimated at 30%. 3. Atherosclerotic disease at both vertebral artery origins with stenosis estimated at 30-50% on each side. Electronically Signed   By: Nelson Chimes M.D.   On: 03/01/2019 11:40   DG CHEST PORT 1 VIEW  Result Date: 03/01/2019 CLINICAL DATA:  COVID pneumonia. Hypoxia. EXAM: PORTABLE CHEST 1 VIEW COMPARISON:  02/26/2019 FINDINGS: Worsening lung aeration with progressive bilateral interstitial and airspace infiltrates consistent with COVID pneumonia. No pleural effusions. No pneumothorax. IMPRESSION: Worsening lung aeration with progressive bilateral infiltrates. Electronically Signed   By: Marijo Sanes M.D.   On: 03/01/2019 10:02   ECHOCARDIOGRAM COMPLETE  Result Date: 02/27/2019   ECHOCARDIOGRAM REPORT   Patient Name:   Dave Harris Date of Exam: 02/27/2019 Medical Rec #:  [id number]            Height:       64.0 in Accession #:    [id number]           Weight:       133.8 lb Date of Birth:  [birth year]            BSA:          1.65 m Patient Age:    4 years  BP:           106/72 mmHg Patient Gender: M                    HR:           79 bpm. Exam Location:  Inpatient Procedure: 2D Echo Indications:    elevated troponin  History:        Patient has prior history of Echocardiogram examinations, most                 recent 06/16/2012. CAD, Prior CABG, Covid  pneumonia,                 Arrythmias:Atrial Fibrillation; Risk Factors:Dyslipidemia.  Sonographer:    Johny Chess Referring Phys: [phone] A CALDWELL POWELL [place]  1. Left ventricular ejection fraction, by visual estimation, is 50 to 55%. The left ventricle has normal function. There is no left ventricular hypertrophy.  2. The left ventricle demonstrates regional wall motion abnormalities.  3. Inferior basal hypokinesis.  4. Global right ventricle has normal systolic function.The right ventricular size is normal. No increase in right ventricular wall thickness.  5. Left atrial size was moderately dilated.  6. Right atrial size was moderately dilated.  7. The mitral valve is normal in structure. Mild mitral valve regurgitation. No evidence of mitral stenosis.  8. The tricuspid valve is normal in structure.  9. The aortic valve is tricuspid. Aortic valve regurgitation is not visualized. Mild aortic valve sclerosis without stenosis. 10. Pulmonic regurgitation is moderate. 11. The pulmonic valve was not well visualized. Pulmonic valve regurgitation is moderate. 12. Moderately elevated pulmonary artery systolic pressure. 13. The inferior vena cava is normal in size with greater than 50% respiratory variability, suggesting right atrial pressure of 3 mmHg. FINDINGS  Left Ventricle: Left ventricular ejection fraction, by visual estimation, is 50 to 55%. The left ventricle has normal function. The left ventricle demonstrates regional wall motion abnormalities. There is no left ventricular hypertrophy. Normal left atrial pressure. Inferior basal hypokinesis. Right Ventricle: The right ventricular size is normal. No increase in right ventricular wall thickness. Global RV systolic function is has normal systolic function. The tricuspid regurgitant velocity is 3.04 m/s, and with an assumed right atrial pressure  of 3 mmHg, the estimated right ventricular systolic pressure is moderately elevated at 40.0 mmHg. Left  Atrium: Left atrial size was moderately dilated. Right Atrium: Right atrial size was moderately dilated Pericardium: There is no evidence of pericardial effusion. Mitral Valve: The mitral valve is normal in structure. There is mild thickening of the mitral valve leaflet(s). Mild mitral valve regurgitation. No evidence of mitral valve stenosis by observation. Tricuspid Valve: The tricuspid valve is normal in structure. Tricuspid valve regurgitation is not demonstrated. Aortic Valve: The aortic valve is tricuspid. Aortic valve regurgitation is not visualized. Mild aortic valve sclerosis is present, with no evidence of aortic valve stenosis. Pulmonic Valve: The pulmonic valve was not well visualized. Pulmonic valve regurgitation is moderate. Pulmonic regurgitation is moderate. Aorta: The aortic root, ascending aorta and aortic arch are all structurally normal, with no evidence of dilitation or obstruction. Venous: The inferior vena cava is normal in size with greater than 50% respiratory variability, suggesting right atrial pressure of 3 mmHg. IAS/Shunts: No atrial level shunt detected by color flow Doppler. There is no evidence of a patent foramen ovale. No ventricular septal defect is seen or detected. There is no evidence of an atrial septal defect.  LEFT VENTRICLE PLAX 2D  LVIDd:         4.30 cm LVIDs:         3.31 cm LV PW:         1.20 cm LV IVS:        0.99 cm LVOT diam:     1.90 cm LV SV:         39 ml LV SV Index:   23.24 LVOT Area:     2.84 cm  RIGHT VENTRICLE TAPSE (M-mode): 1.2 cm LEFT ATRIUM             Index       RIGHT ATRIUM           Index LA diam:        4.40 cm 2.67 cm/m  RA Area:     24.80 cm LA Vol (A2C):   84.5 ml 51.24 ml/m RA Volume:   81.80 ml  49.60 ml/m LA Vol (A4C):   79.7 ml 48.33 ml/m LA Biplane Vol: 84.1 ml 50.99 ml/m   AORTA Ao Root diam: 3.30 cm TRICUSPID VALVE TR Peak grad:   37.0 mmHg TR Vmax:        338.00 cm/s  SHUNTS Systemic Diam: 1.90 cm  Jenkins Rouge MD Electronically  signed by Jenkins Rouge MD Signature Date/Time: 02/27/2019/3:48:13 PM    Final    CT HEAD CODE STROKE WO CONTRAST`  Result Date: 03/01/2019 CLINICAL DATA:  Code stroke. Neuro deficit, acute, stroke suspected. Additional history provided: Stroke, last known normal 0800 EXAM: CT HEAD WITHOUT CONTRAST TECHNIQUE: Contiguous axial images were obtained from the base of the skull through the vertex without intravenous contrast. COMPARISON:  Head CT 07/03/2018, brain MRI 04/05/2018 FINDINGS: Brain: Mildly motion degraded examination. No evidence of acute intracranial hemorrhage. There is an acute cortical/subcortical infarct within the anterior right frontal lobe (series 2, image 17). Redemonstrated chronic cortical/subcortical infarct within the left superior frontal gyrus. No evidence of intracranial mass. No midline shift or extra-axial fluid collection. Stable background generalized parenchymal atrophy and chronic small vessel ischemic disease. Redemonstrated chronic lacunar infarct within the right caudate nucleus. Vascular: No definite hyperdense vessel. Skull: Normal. Negative for fracture or focal lesion. Sinuses/Orbits: Visualized orbits demonstrate no acute abnormality. ASPECTS ([place] Stroke Program Early CT Score) - Ganglionic level infarction (caudate, lentiform nuclei, internal capsule, insula, M1-M3 cortex): 7 - Supraganglionic infarction (M4-M6 cortex): 2 (point subtracted for acute infarct in the right M5 territory) Total score (0-10 with 10 being normal): 9 These results were called by telephone at the time of interpretation on 03/01/2019 at 11:03 am to provider Dr. Leonel Ramsay, who verbally acknowledged these results. IMPRESSION: 1. Small acute cortical/subcortical infarct within the anterior right frontal lobe. ASPECTS 9. No hemorrhagic conversion or significant mass effect. 2. Redemonstrated chronic cortically based infarct within the left superior frontal gyrus. 3. Generalized parenchymal atrophy and  chronic small vessel ischemic disease. Redemonstrated chronic lacunar infarct within the right caudate nucleus Electronically Signed   By: Kellie Simmering DO   On: 03/01/2019 11:07   Subjective: Able to read large printed words/questions. Holds up requested number of fingers to verify understanding. When explained the options, namely hospice vs. full medical treatment including feeding tube, he consistently states he wishes for hospice, to drink water ad lib, and no feeding tube. This was confirmed by his daughter last night as well.   Discharge Exam: Vitals:   03/02/19 2215 03/03/19 0752  BP: 137/66 (!) 161/102  Pulse: (!) 55   Resp: 20  Temp: 98.2 F (36.8 C) 98.3 F (36.8 C)  SpO2: 91% 93%   General: Awake, responsive, no distress Cardiovascular: RRR, no edema Respiratory: Nonlabored at rest, very weak voice, crackles bilaterally, wet vocal quality to cough. Abdominal: Soft, NT, ND, bowel sounds + Neuro: Alert, very HOH and able to whisper but not audibly enough to be understood. Moves right arm/hand on command with dexterity, left hemiparesis and rightward gaze stable.   Labs: BNP (last 3 results) Recent Labs    02/26/19 2341  BNP [id number]*   Basic Metabolic Panel: Recent Labs  Lab 02/27/19 0208 02/28/19 0146 03/01/19 0524 03/02/19 0356 03/03/19 0550  NA 140 138 142 145 150*  K 3.9 3.8 3.8 4.2 4.4  CL 102 103 107 108 111  CO2 21* 21* 22 23 23   GLUCOSE 76 135* 112* 125* 117*  BUN 40* 48* 53* 56* 62*  CREATININE 1.21 1.14 1.11 1.30* 0.97  CALCIUM 9.0 9.1 9.2 9.1 9.2  MG 2.2 2.3 2.3 2.6* 2.9*  PHOS 3.2 3.1 3.5 4.7* 4.2   Liver Function Tests: Recent Labs  Lab 02/27/19 0208 02/28/19 0146 03/01/19 0524 03/02/19 0356 03/03/19 0550  AST 37 47* 75* 113* 90*  ALT 24 31 58* 96* 93*  ALKPHOS 99 120 121 135* 158*  BILITOT 1.0 0.6 0.8 0.7 1.1  PROT 7.2 6.7 6.8 7.1 7.2  ALBUMIN 2.8* 2.4* 2.4* 2.4* 2.4*   CBC: Recent Labs  Lab 02/27/19 0208 02/28/19 0146  03/01/19 0524 03/02/19 0356 03/03/19 0550  WBC 13.7* 16.8* 19.5* 13.5* 14.6*  NEUTROABS 12.5* 15.8* 17.9* 12.2* 13.1*  HGB 13.9 12.8* 13.4 13.3 13.9  HCT 43.4 39.1 42.1 43.1 44.2  MCV 81.4 79.6* 81.1 82.7 82.5  PLT 269 233 285 267 245   CBG: Recent Labs  Lab 03/02/19 0020 03/02/19 0351 03/02/19 0747 03/02/19 1155 03/02/19 1706  GLUCAP 140* 115* 125* 104* 135*   D-Dimer Recent Labs    03/02/19 0356 03/03/19 0550  DDIMER 14.18* 14.52*   Hgb A1c Recent Labs    02/28/19 1800 03/02/19 0356  HGBA1C 6.4* 6.5*   Lipid Profile Recent Labs    03/02/19 0356  CHOL 63  HDL 34*  LDLCALC 24  TRIG 27  CHOLHDL 1.9    Time coordinating discharge: Approximately 40 minutes  Patrecia Pour, MD  [facility] 03/03/2019, 5:23 PM

## 2019-03-03 NOTE — Progress Notes (Signed)
PROGRESS NOTE  [name]  [id number] DOB: Jun 22, 1929 DOA: 02/26/2019 PCP: Garwin Brothers, MD   Brief Narrative: Dave Dahmen [name] medical history significant ofCVA, CAD, Atrial Fibrillation on apixaban, carotid artery stenosis, HTN, HLD,BPH and depression, who initially presented from his ALF to [facility] ED with hypoxemic respiratory failure with initial oxygen sats of 60s.Patient was placed on NRB, given Remdesivir, Dexamethasone as well as antibiotics prior to transfer to [facility] on 1/16.  He has significant hearing impairment which limits communication.   He'd been improving from a respiratory status and was on room air on 1/18, but on 1/19 he was noted to have L sided weakness and neglect concerning for stroke as well as worsening encephalopathy and hypoxia. Head CT confirmed left frontal CVA. Neurology was consulted and repeat head CT 1/20 confirms expected evolutionary changes/cytotoxic edema. Speech therapy evaluation has demonstrated significant aspiration risk, feeding tube recommended. The patient and family have consistently declared that they do not want a feeding tube and the the goals of care should be transitioned purely to comfort measures. Residential hospice placement is being pursued, palliative care consulted, and comfort feeding is permitted.   Assessment & Plan: Active Problems:   Pneumonia due to COVID-19 virus   Cerebral embolism with cerebral infarction  Acute hypoxemic respiratory failure due to covid-19 pneumonia and superimposed bacterial pneumonia: Continues to have significant exertional hypoxia.  - Completed remdesivir x5 days   - Stop antibiotics, steroids given comfort measures. Stop checking labs.  - Tylenol and antitussives prn - Isolation period would be recommended for 21 days from positive testing (which was presumably 1/16).  - No further labs/radiography planned.  Right frontal CVA in patient with history of  carotid stenosis and CVA 2017 (L MCA s/p left CEA):  - Repeat CT head showed expected evolutionary changes. Exam findings consistent with this infarct. - Goals of care transitioned to comfort measures, will DC all anticoagulation, po medications.  Acute metabolic encephalopathy on chronic dementia:  - Delirium precautions. Exacerbated by new neurological deficits and extreme HOH, though responses to questions appear to suggest he is making consistent medical decisions for himself. These wishes were confirmed by his daughter and with his daughter.   HTN, HFpEF, CAD with type II NSTEMI: Echocardiogram showed inferior hypokinesis. No STEMI on ECG nor chest pain reported.    - No further interventions planned. DC monitoring.  NSVT:  - No further interventions planned. DC monitoring.  GERD:  - No po medications  Depression:  - Continue remeron if desired for insomnia.  BPH:  - Monitor UOP, cath prn.  Prediabetes with steroid-induced hyperglycemia:  - No further interventions planned. DC monitoring.  DVT prophylaxis: None, comfort measures. Code Status: DNR Family Communication: Speaking with daughter regularly. Disposition Plan: Residential hospice  Consultants:   Neurology  Palliative care medicine team  Procedures:   Echocardiogram  Antimicrobials:  Ceftriaxone, azithromycin, remdesivir   Subjective: Able to read large printed words/questions. Holds up requested number of fingers to verify understanding. When explained the options, namely hospice vs. full medical treatment including feeding tube, he consistently states he wishes for hospice, to drink water ad lib, and no feeding tube. This was confirmed by his daughter last night as well.   Objective: Vitals:   03/02/19 1300 03/02/19 2002 03/02/19 2215 03/03/19 0752  BP: (!) 144/84 (!) 152/81 137/66 (!) 161/102  Pulse: 93  (!) 55   Resp: 20 19 20    Temp: 97.7 F (36.5 C) (!) 97.4  F (36.3 C) 98.2 F (36.8 C) 98.3  F (36.8 C)  TempSrc: Oral Axillary Oral Oral  SpO2: 93% (!) 89% 91% 93%  Weight:      Height:        Intake/Output Summary (Last 24 hours) at 03/03/2019 1203 Last data filed at 03/02/2019 2000 Gross per 24 hour  Intake 240 ml  Output 650 ml  Net -410 ml   Filed Weights   02/26/19 1935  Weight: 60.7 kg   Gen: Elderly male in no distress Pulm: Nonlabored breathing, tachypnea without distress. Crackles bilaterally. CV: Regular rate and rhythm. No murmur, rub, or gallop. No JVD, no dependent edema. GI: Abdomen soft, non-tender, non-distended, with normoactive bowel sounds.  Ext: Warm, no deformities Skin: No new rashes, lesions or ulcers on visualized skin. Neuro: Alert, very HOH and able to whisper but not audibly enough to be understood. Moves right arm/hand on command with dexterity, left hemiparesis and rightward gaze stable.  Psych: Judgement and insight appear reasonably intact.   Data Reviewed: I have personally reviewed following labs and imaging studies  CBC: Recent Labs  Lab 02/27/19 0208 02/28/19 0146 03/01/19 0524 03/02/19 0356 03/03/19 0550  WBC 13.7* 16.8* 19.5* 13.5* 14.6*  NEUTROABS 12.5* 15.8* 17.9* 12.2* 13.1*  HGB 13.9 12.8* 13.4 13.3 13.9  HCT 43.4 39.1 42.1 43.1 44.2  MCV 81.4 79.6* 81.1 82.7 82.5  PLT 269 233 285 267 [id number]   Basic Metabolic Panel: Recent Labs  Lab 02/27/19 0208 02/28/19 0146 03/01/19 0524 03/02/19 0356 03/03/19 0550  NA 140 138 142 145 150*  K 3.9 3.8 3.8 4.2 4.4  CL 102 103 107 108 111  CO2 21* 21* 22 23 23   GLUCOSE 76 135* 112* 125* 117*  BUN 40* 48* 53* 56* 62*  CREATININE 1.21 1.14 1.11 1.30* 0.97  CALCIUM 9.0 9.1 9.2 9.1 9.2  MG 2.2 2.3 2.3 2.6* 2.9*  PHOS 3.2 3.1 3.5 4.7* 4.2   GFR: Estimated Creatinine Clearance: 44.9 mL/min (by C-G formula based on SCr of 0.97 mg/dL). Liver Function Tests: Recent Labs  Lab 02/27/19 0208 02/28/19 0146 03/01/19 0524 03/02/19 0356 03/03/19 0550  AST 37 47* 75* 113* 90*   ALT 24 31 58* 96* 93*  ALKPHOS 99 120 121 135* 158*  BILITOT 1.0 0.6 0.8 0.7 1.1  PROT 7.2 6.7 6.8 7.1 7.2  ALBUMIN 2.8* 2.4* 2.4* 2.4* 2.4*   No results for input(s): LIPASE, AMYLASE in the last 168 hours. No results for input(s): AMMONIA in the last 168 hours. Coagulation Profile: Recent Labs  Lab 03/01/19 1034  INR 2.7*   Cardiac Enzymes: No results for input(s): CKTOTAL, CKMB, CKMBINDEX, TROPONINI in the last 168 hours. BNP (last 3 results) No results for input(s): PROBNP in the last 8760 hours. HbA1C: Recent Labs    02/28/19 1800 03/02/19 0356  HGBA1C 6.4* 6.5*   CBG: Recent Labs  Lab 03/02/19 0020 03/02/19 0351 03/02/19 0747 03/02/19 1155 03/02/19 1706  GLUCAP 140* 115* 125* 104* 135*   Lipid Profile: Recent Labs    03/02/19 0356  CHOL 63  HDL 34*  LDLCALC 24  TRIG 27  CHOLHDL 1.9   Thyroid Function Tests: No results for input(s): TSH, T4TOTAL, FREET4, T3FREE, THYROIDAB in the last 72 hours. Anemia Panel: Recent Labs    03/02/19 0356 03/03/19 0550  FERRITIN 160 160   Urine analysis:    Component Value Date/Time   COLORURINE YELLOW 05/23/2015 1829   APPEARANCEUR CLEAR 05/23/2015 1829   LABSPEC 1.013 05/23/2015  [place] 6.0 05/23/2015 1829   GLUCOSEU NEGATIVE 05/23/2015 1829   HGBUR NEGATIVE 05/23/2015 1829   BILIRUBINUR NEGATIVE 05/23/2015 1829   KETONESUR NEGATIVE 05/23/2015 1829   PROTEINUR NEGATIVE 05/23/2015 1829   NITRITE NEGATIVE 05/23/2015 1829   LEUKOCYTESUR NEGATIVE 05/23/2015 1829   No results found for this or any previous visit (from the past 240 hour(s)).    Radiology Studies: CT HEAD WO CONTRAST  Result Date: 03/02/2019 CLINICAL DATA:  Patient status post right frontal lobe infarct. EXAM: CT HEAD WITHOUT CONTRAST TECHNIQUE: Contiguous axial images were obtained from the base of the skull through the vertex without intravenous contrast. COMPARISON:  Head CT scan 03/01/2019. FINDINGS: Brain: Patient motion degrades the  examination. Edema in the right frontal lobe consistent with infarct is again seen. No hemorrhagic transformation. No midline shift, hydrocephalus or other new abnormality is identified. Atrophy, chronic microvascular ischemic change and remote left parafalcine infarct again seen. Vascular: Extensive atherosclerosis. Skull: Intact.  No focal lesion. Sinuses/Orbits: No acute abnormality.  Status post cataract surgery. Other: None. IMPRESSION: Expected evolutionary change of the patient's right frontal infarct. Negative for hemorrhagic transformation or other new abnormality. Electronically Signed   By: Inge Rise M.D.   On: 03/02/2019 11:27    Scheduled Meds: . fluticasone furoate-vilanterol  1 puff Inhalation Daily  . tamsulosin  0.4 mg Oral QHS   Continuous Infusions:    LOS: 5 days   Time spent: 35 minutes.  Patrecia Pour, MD [facility] [email or web address] 03/03/2019, 12:03 PM

## 2019-03-03 NOTE — Progress Notes (Addendum)
Chaplain responding to spiritual care consult entered by RN Leilani Able re: major life transition.    As pt documented with communication limitations, attempted to contact pt's daughter via phone to assess pt needs for spiritual support. Call resulted in connection to voicemail.  Chaplain did not leave message.  Consulted with pt RN, who is unaware of specific needs for this pt.  Will attempt to contact daughter again via phone.  Jerene Pitch, MDiv, [facility]  Lead Clinical Chaplain  [facility]

## 2019-03-03 NOTE — Progress Notes (Signed)
[place] report was given to [employer].

## 2019-03-03 NOTE — TOC Progression Note (Addendum)
Transition of Care [facility]) - Progression Note    Patient Details  Name: Dave Harris MRN: [id number] Date of Birth: [birth year]  Transition of Care [facility]) CM/SW [place], LCSW Phone Number: 03/03/2019, 3:09 PM  Clinical Narrative:     CSW informed that patient's family is ready for patient to transition to residential hospice. Referral given to [facility] [phone] with [facility]( location) per family request.   Patient is being reviewed for appropriateness.   TOC will continue to follow.   Expected Discharge Plan: [place] Barriers to Discharge: Continued Medical Work up  Expected Discharge Plan and Services Expected Discharge Plan: [place] In-house Referral: Clinical Social Work, Hospice / Palliative Care Discharge Planning Services: CM Consult Post Acute Care Choice: Resumption of Svcs/PTA Provider Living arrangements for the past 2 months: Assisted Living Facility                 DME Arranged: N/A DME Agency: NA       HH Arranged: NA HH Agency: NA         Social Determinants of Health (SDOH) Interventions    Readmission Risk Interventions No flowsheet data found.

## 2019-03-03 NOTE — Progress Notes (Signed)
Patient is being transferred to [place]. Goals are clear, no additional palliative care needs. Will sign off.  Lane Hacker, DO Palliative Medicine

## 2019-03-14 DEATH — deceased

## 2022-01-10 IMAGING — CT CT HEAD W/O CM
3 series · 16 of 30 positions shown, 19 images · non-contrast
Comparison: Head CT scan 03/01/2019.

CLINICAL DATA: Patient status post right frontal lobe infarct.

EXAM:
CT HEAD WITHOUT CONTRAST
TECHNIQUE: Contiguous axial images were obtained from the base of the skull
through the vertex without intravenous contrast.

[Series 3: routine head w/o · axial · non-contrast · 0.45mm/px · z∈[-212,-169]mm · 4 of 16 slices shown (1 of 2)]
[im 4/16  brain]
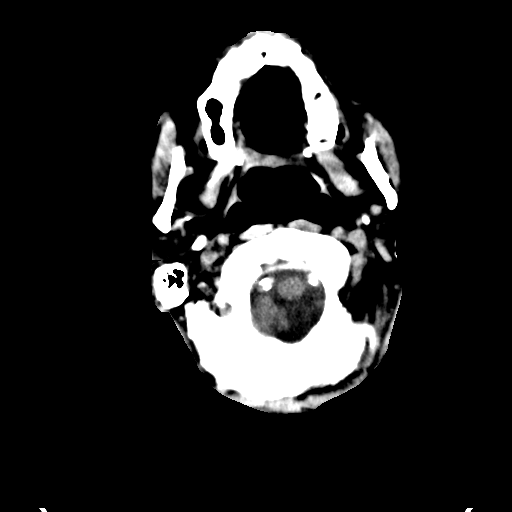
[im 7/16  brain]
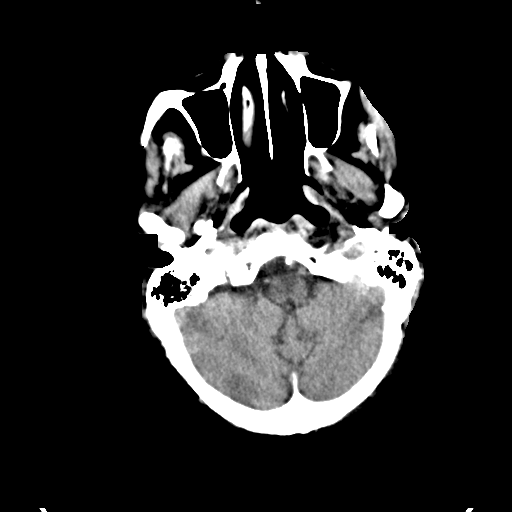
[im 10/16  brain]
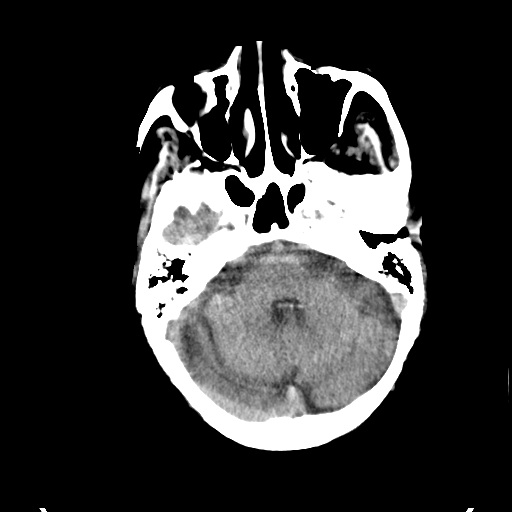
[im 13/16  brain]
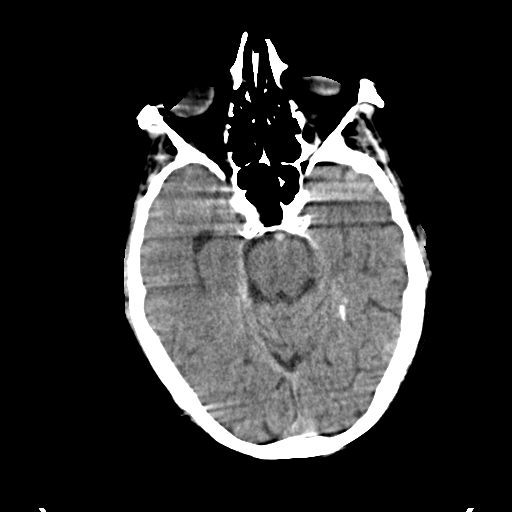

[Series 5: routine head w/o · axial · non-contrast · 0.45mm/px · z∈[-212,-82]mm · 10 of 34 slices shown, 13 images (2 of 2)]
[im 4/34  brain]
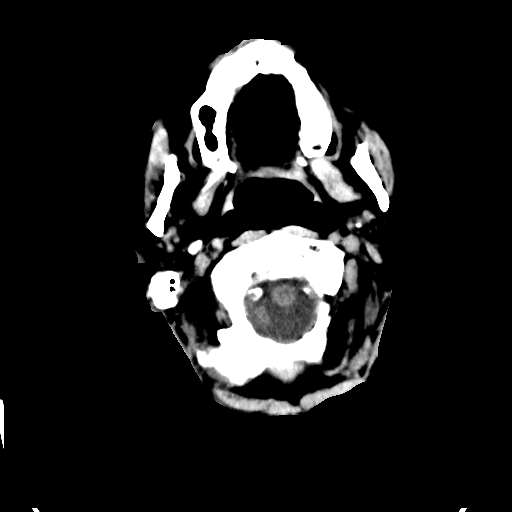
[im 4/34  bone]
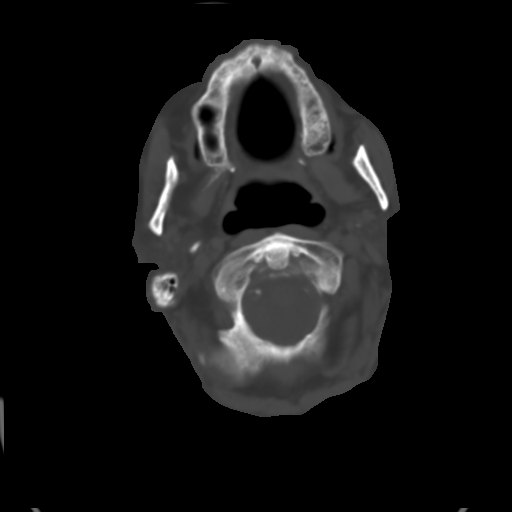
[im 7/34  brain]
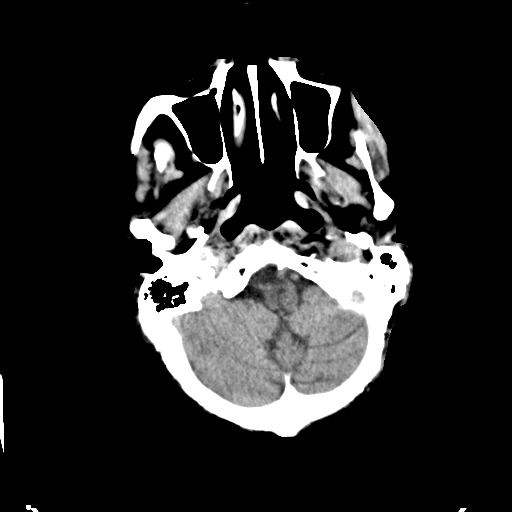
[im 10/34  brain]
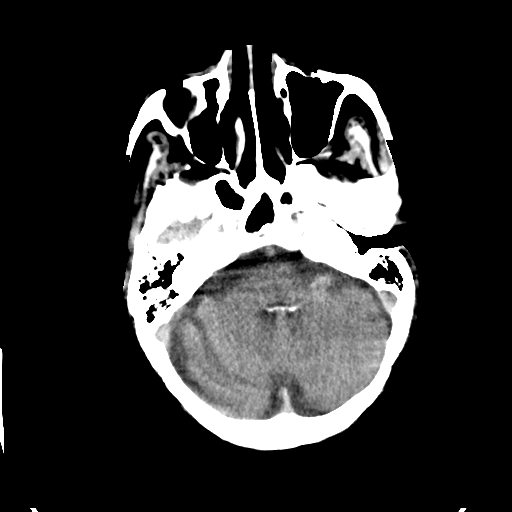
[im 13/34  brain]
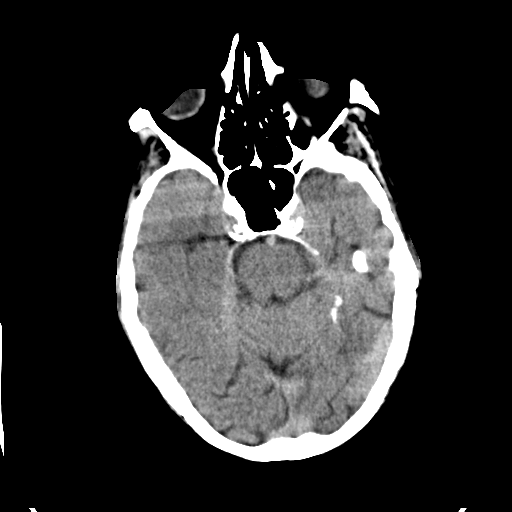
[im 16/34  brain]
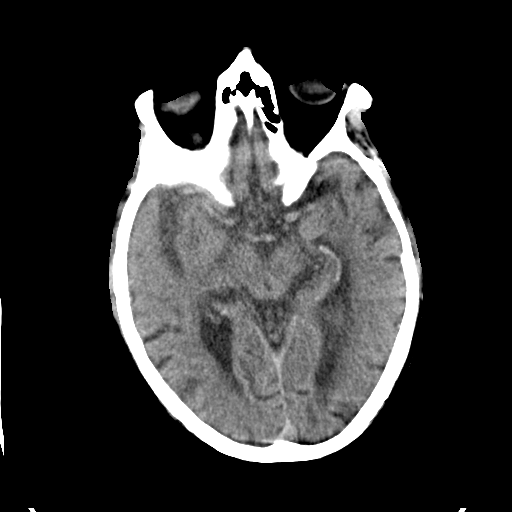
[im 16/34  bone]
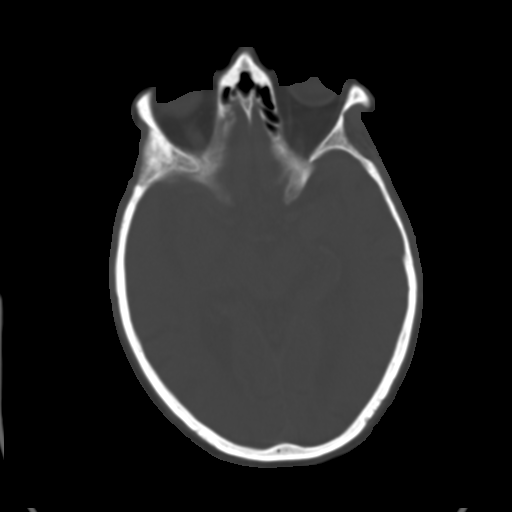
[im 19/34  brain]
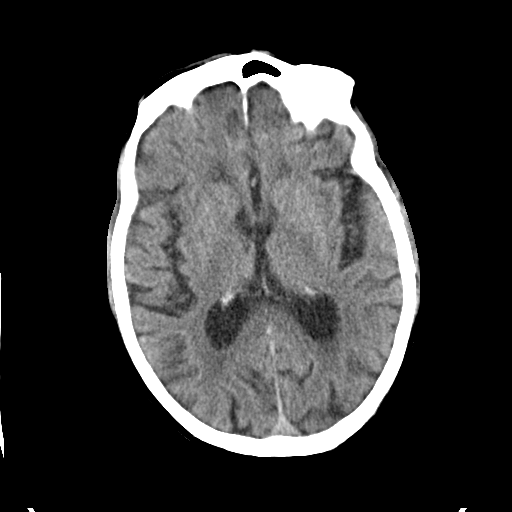
[im 22/34  brain]
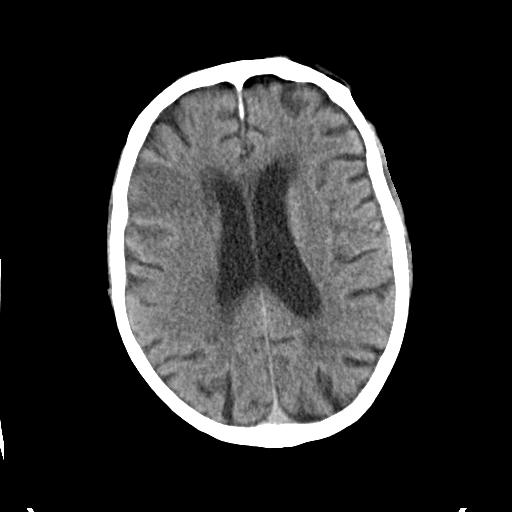
[im 25/34  brain]
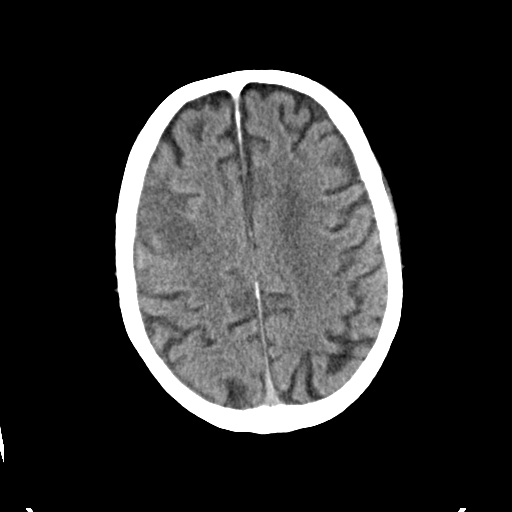
[im 28/34  brain]
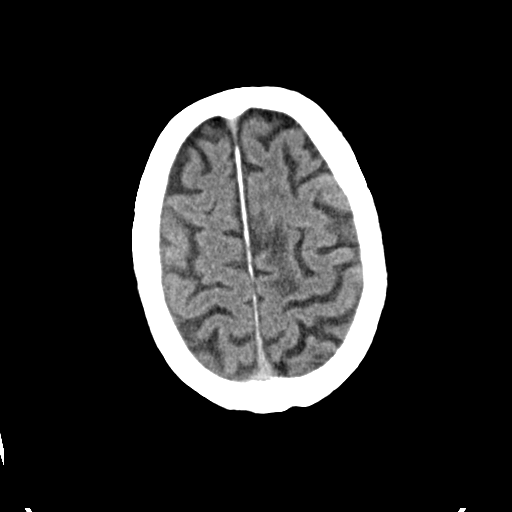
[im 28/34  bone]
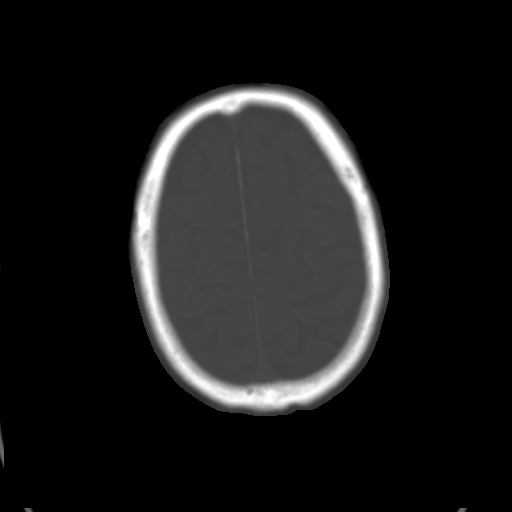
[im 31/34  brain]
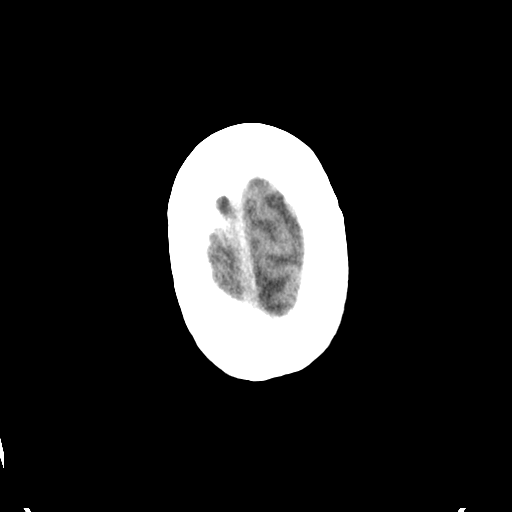

[Series 7: routine head bone · axial · 0.45mm/px · z∈[-212,-198]mm · 2 of 34 slices shown]
[im 4/34  bone]
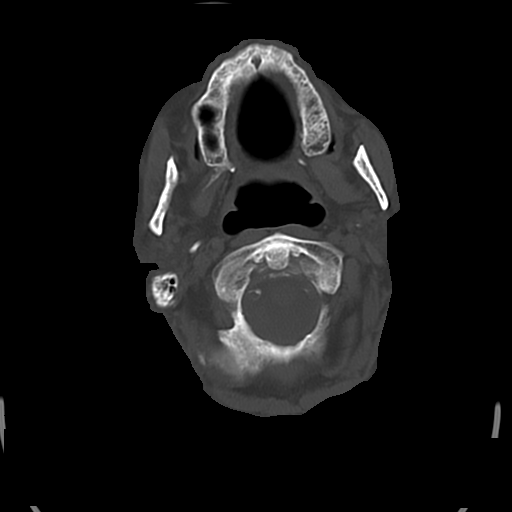
[im 7/34  bone]
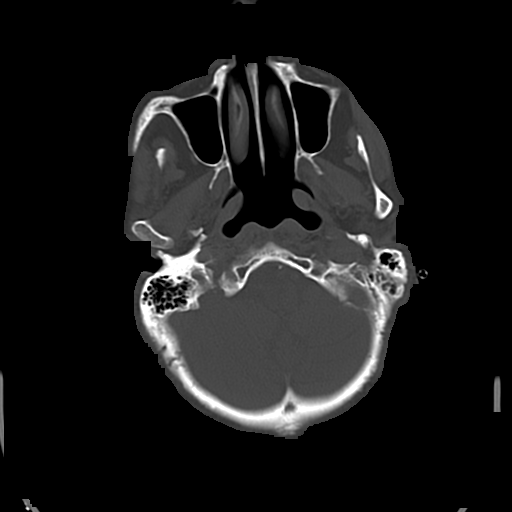

[16 of 30 positions shown; findings below may reference images not displayed]

FINDINGS: Brain: Patient motion degrades the examination. Edema in the right
frontal lobe consistent with infarct is again seen. No hemorrhagic
transformation. No midline shift, hydrocephalus or other new
abnormality is identified. Atrophy, chronic microvascular ischemic
change and remote left parafalcine infarct again seen.

Vascular: Extensive atherosclerosis.

Skull: Intact.  No focal lesion.

Sinuses/Orbits: No acute abnormality.  Status post cataract surgery.

Other: None.
IMPRESSION: Expected evolutionary change of the patient's right frontal infarct.
Negative for hemorrhagic transformation or other new abnormality.
